# Patient Record
Sex: Female | Born: 1980 | Race: White | Hispanic: No | Marital: Single | Smoking: Current every day smoker
Health system: Southern US, Community
[De-identification: ages and names within clinical notes are randomized; demographics above are authoritative.]

## PROBLEM LIST (undated history)

## (undated) ENCOUNTER — Inpatient Hospital Stay (HOSPITAL_COMMUNITY): Payer: Self-pay

## (undated) DIAGNOSIS — R569 Unspecified convulsions: Secondary | ICD-10-CM

## (undated) DIAGNOSIS — R51 Headache: Secondary | ICD-10-CM

## (undated) DIAGNOSIS — K645 Perianal venous thrombosis: Secondary | ICD-10-CM

## (undated) DIAGNOSIS — M47819 Spondylosis without myelopathy or radiculopathy, site unspecified: Secondary | ICD-10-CM

## (undated) DIAGNOSIS — F329 Major depressive disorder, single episode, unspecified: Secondary | ICD-10-CM

## (undated) DIAGNOSIS — J209 Acute bronchitis, unspecified: Secondary | ICD-10-CM

## (undated) DIAGNOSIS — IMO0002 Reserved for concepts with insufficient information to code with codable children: Secondary | ICD-10-CM

## (undated) DIAGNOSIS — R112 Nausea with vomiting, unspecified: Secondary | ICD-10-CM

## (undated) DIAGNOSIS — F988 Other specified behavioral and emotional disorders with onset usually occurring in childhood and adolescence: Secondary | ICD-10-CM

## (undated) DIAGNOSIS — F99 Mental disorder, not otherwise specified: Secondary | ICD-10-CM

## (undated) DIAGNOSIS — J029 Acute pharyngitis, unspecified: Secondary | ICD-10-CM

## (undated) DIAGNOSIS — F419 Anxiety disorder, unspecified: Secondary | ICD-10-CM

## (undated) DIAGNOSIS — G4700 Insomnia, unspecified: Secondary | ICD-10-CM

## (undated) DIAGNOSIS — F53 Postpartum depression: Secondary | ICD-10-CM

## (undated) DIAGNOSIS — O99345 Other mental disorders complicating the puerperium: Secondary | ICD-10-CM

## (undated) HISTORY — PX: WISDOM TOOTH EXTRACTION: SHX21

## (undated) HISTORY — DX: Acute bronchitis, unspecified: J20.9

## (undated) HISTORY — DX: Acute pharyngitis, unspecified: J02.9

## (undated) HISTORY — DX: Other specified behavioral and emotional disorders with onset usually occurring in childhood and adolescence: F98.8

## (undated) HISTORY — DX: Nausea with vomiting, unspecified: R11.2

## (undated) HISTORY — DX: Postpartum depression: F53.0

## (undated) HISTORY — DX: Insomnia, unspecified: G47.00

## (undated) HISTORY — DX: Anxiety disorder, unspecified: F41.9

## (undated) HISTORY — DX: Spondylosis without myelopathy or radiculopathy, site unspecified: M47.819

## (undated) HISTORY — DX: Other mental disorders complicating the puerperium: O99.345

## (undated) HISTORY — DX: Major depressive disorder, single episode, unspecified: F32.9

## (undated) HISTORY — DX: Perianal venous thrombosis: K64.5

---
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
## 2002-12-25 ENCOUNTER — Emergency Department (HOSPITAL_COMMUNITY): Admission: EM | Admit: 2002-12-25 | Discharge: 2002-12-25 | Payer: Self-pay | Admitting: *Deleted

## 2002-12-25 ENCOUNTER — Encounter: Payer: Self-pay | Admitting: Emergency Medicine

## 2003-03-24 ENCOUNTER — Inpatient Hospital Stay (HOSPITAL_COMMUNITY): Admission: AD | Admit: 2003-03-24 | Discharge: 2003-03-24 | Payer: Self-pay | Admitting: Obstetrics and Gynecology

## 2004-02-27 ENCOUNTER — Emergency Department (HOSPITAL_COMMUNITY): Admission: EM | Admit: 2004-02-27 | Discharge: 2004-02-27 | Payer: Self-pay | Admitting: Emergency Medicine

## 2005-08-13 ENCOUNTER — Emergency Department (HOSPITAL_COMMUNITY): Admission: EM | Admit: 2005-08-13 | Discharge: 2005-08-13 | Payer: Self-pay | Admitting: Emergency Medicine

## 2005-09-18 ENCOUNTER — Emergency Department (HOSPITAL_COMMUNITY): Admission: EM | Admit: 2005-09-18 | Discharge: 2005-09-18 | Payer: Self-pay | Admitting: Emergency Medicine

## 2007-01-29 ENCOUNTER — Emergency Department (HOSPITAL_COMMUNITY): Admission: EM | Admit: 2007-01-29 | Discharge: 2007-01-29 | Payer: Self-pay | Admitting: Emergency Medicine

## 2007-04-06 ENCOUNTER — Emergency Department (HOSPITAL_COMMUNITY): Admission: EM | Admit: 2007-04-06 | Discharge: 2007-04-06 | Payer: Self-pay | Admitting: Emergency Medicine

## 2007-05-26 ENCOUNTER — Emergency Department (HOSPITAL_COMMUNITY): Admission: EM | Admit: 2007-05-26 | Discharge: 2007-05-26 | Payer: Self-pay | Admitting: Emergency Medicine

## 2007-10-25 DIAGNOSIS — IMO0002 Reserved for concepts with insufficient information to code with codable children: Secondary | ICD-10-CM

## 2007-10-25 HISTORY — DX: Reserved for concepts with insufficient information to code with codable children: IMO0002

## 2007-10-27 ENCOUNTER — Emergency Department (HOSPITAL_COMMUNITY): Admission: EM | Admit: 2007-10-27 | Discharge: 2007-10-27 | Payer: Self-pay | Admitting: Emergency Medicine

## 2008-03-16 ENCOUNTER — Emergency Department (HOSPITAL_COMMUNITY): Admission: EM | Admit: 2008-03-16 | Discharge: 2008-03-17 | Payer: Self-pay | Admitting: Emergency Medicine

## 2008-04-18 ENCOUNTER — Emergency Department (HOSPITAL_BASED_OUTPATIENT_CLINIC_OR_DEPARTMENT_OTHER): Admission: EM | Admit: 2008-04-18 | Discharge: 2008-04-18 | Payer: Self-pay | Admitting: Emergency Medicine

## 2008-05-09 ENCOUNTER — Emergency Department (HOSPITAL_BASED_OUTPATIENT_CLINIC_OR_DEPARTMENT_OTHER): Admission: EM | Admit: 2008-05-09 | Discharge: 2008-05-09 | Payer: Self-pay | Admitting: Emergency Medicine

## 2008-05-17 ENCOUNTER — Emergency Department (HOSPITAL_COMMUNITY): Admission: EM | Admit: 2008-05-17 | Discharge: 2008-05-17 | Payer: Self-pay | Admitting: Emergency Medicine

## 2008-06-07 ENCOUNTER — Inpatient Hospital Stay (HOSPITAL_COMMUNITY): Admission: EM | Admit: 2008-06-07 | Discharge: 2008-06-12 | Payer: Self-pay | Admitting: Emergency Medicine

## 2008-07-13 ENCOUNTER — Emergency Department (HOSPITAL_COMMUNITY): Admission: EM | Admit: 2008-07-13 | Discharge: 2008-07-13 | Payer: Self-pay | Admitting: Emergency Medicine

## 2008-07-26 ENCOUNTER — Ambulatory Visit (HOSPITAL_COMMUNITY): Admission: RE | Admit: 2008-07-26 | Discharge: 2008-07-26 | Payer: Self-pay | Admitting: Neurosurgery

## 2008-08-07 ENCOUNTER — Ambulatory Visit (HOSPITAL_COMMUNITY): Admission: RE | Admit: 2008-08-07 | Discharge: 2008-08-07 | Payer: Self-pay | Admitting: Neurosurgery

## 2008-08-11 ENCOUNTER — Emergency Department (HOSPITAL_COMMUNITY): Admission: EM | Admit: 2008-08-11 | Discharge: 2008-08-11 | Payer: Self-pay | Admitting: Emergency Medicine

## 2008-09-22 ENCOUNTER — Ambulatory Visit (HOSPITAL_COMMUNITY): Admission: RE | Admit: 2008-09-22 | Discharge: 2008-09-22 | Payer: Self-pay | Admitting: Neurosurgery

## 2008-10-16 ENCOUNTER — Emergency Department (HOSPITAL_COMMUNITY): Admission: EM | Admit: 2008-10-16 | Discharge: 2008-10-16 | Payer: Self-pay | Admitting: Emergency Medicine

## 2008-10-20 ENCOUNTER — Emergency Department (HOSPITAL_COMMUNITY): Admission: EM | Admit: 2008-10-20 | Discharge: 2008-10-20 | Payer: Self-pay | Admitting: Emergency Medicine

## 2009-01-01 ENCOUNTER — Ambulatory Visit (HOSPITAL_COMMUNITY): Admission: RE | Admit: 2009-01-01 | Discharge: 2009-01-01 | Payer: Self-pay | Admitting: Obstetrics

## 2009-01-27 ENCOUNTER — Inpatient Hospital Stay (HOSPITAL_COMMUNITY): Admission: AD | Admit: 2009-01-27 | Discharge: 2009-01-27 | Payer: Self-pay | Admitting: Obstetrics

## 2009-02-28 ENCOUNTER — Inpatient Hospital Stay (HOSPITAL_COMMUNITY): Admission: AD | Admit: 2009-02-28 | Discharge: 2009-03-01 | Payer: Self-pay | Admitting: Obstetrics & Gynecology

## 2009-02-28 ENCOUNTER — Ambulatory Visit: Payer: Self-pay | Admitting: Physician Assistant

## 2009-03-31 ENCOUNTER — Ambulatory Visit (HOSPITAL_COMMUNITY): Admission: RE | Admit: 2009-03-31 | Discharge: 2009-03-31 | Payer: Self-pay | Admitting: Obstetrics

## 2009-06-01 ENCOUNTER — Inpatient Hospital Stay (HOSPITAL_COMMUNITY): Admission: AD | Admit: 2009-06-01 | Discharge: 2009-06-01 | Payer: Self-pay | Admitting: Obstetrics

## 2009-06-07 ENCOUNTER — Inpatient Hospital Stay (HOSPITAL_COMMUNITY): Admission: AD | Admit: 2009-06-07 | Discharge: 2009-06-07 | Payer: Self-pay | Admitting: Obstetrics & Gynecology

## 2009-06-15 ENCOUNTER — Inpatient Hospital Stay (HOSPITAL_COMMUNITY): Admission: RE | Admit: 2009-06-15 | Discharge: 2009-06-18 | Payer: Self-pay | Admitting: Obstetrics & Gynecology

## 2009-07-15 ENCOUNTER — Ambulatory Visit: Payer: Self-pay | Admitting: Diagnostic Radiology

## 2009-07-15 ENCOUNTER — Emergency Department (HOSPITAL_BASED_OUTPATIENT_CLINIC_OR_DEPARTMENT_OTHER): Admission: EM | Admit: 2009-07-15 | Discharge: 2009-07-15 | Payer: Self-pay | Admitting: Emergency Medicine

## 2009-09-09 ENCOUNTER — Ambulatory Visit: Payer: Self-pay | Admitting: Diagnostic Radiology

## 2009-09-09 ENCOUNTER — Emergency Department (HOSPITAL_BASED_OUTPATIENT_CLINIC_OR_DEPARTMENT_OTHER): Admission: EM | Admit: 2009-09-09 | Discharge: 2009-09-09 | Payer: Self-pay | Admitting: Emergency Medicine

## 2009-09-23 LAB — CONVERTED CEMR LAB

## 2009-10-07 ENCOUNTER — Ambulatory Visit (HOSPITAL_COMMUNITY): Admission: RE | Admit: 2009-10-07 | Discharge: 2009-10-07 | Payer: Self-pay | Admitting: Neurosurgery

## 2009-11-06 ENCOUNTER — Inpatient Hospital Stay (HOSPITAL_COMMUNITY): Admission: RE | Admit: 2009-11-06 | Discharge: 2009-11-08 | Payer: Self-pay | Admitting: Neurosurgery
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
## 2010-04-15 ENCOUNTER — Emergency Department (HOSPITAL_BASED_OUTPATIENT_CLINIC_OR_DEPARTMENT_OTHER): Admission: EM | Admit: 2010-04-15 | Discharge: 2010-04-15 | Payer: Self-pay | Admitting: Emergency Medicine

## 2010-04-15 ENCOUNTER — Ambulatory Visit: Payer: Self-pay | Admitting: Diagnostic Radiology

## 2010-05-05 LAB — CONVERTED CEMR LAB: Pap Smear: NORMAL

## 2010-07-05 ENCOUNTER — Emergency Department (HOSPITAL_COMMUNITY): Admission: EM | Admit: 2010-07-05 | Discharge: 2010-07-05 | Payer: Self-pay | Admitting: Emergency Medicine

## 2010-07-19 ENCOUNTER — Emergency Department (HOSPITAL_COMMUNITY): Admission: EM | Admit: 2010-07-19 | Discharge: 2010-07-19 | Payer: Self-pay | Admitting: Emergency Medicine

## 2010-08-16 ENCOUNTER — Ambulatory Visit: Payer: Self-pay | Admitting: Family Medicine

## 2010-08-16 DIAGNOSIS — Z9189 Other specified personal risk factors, not elsewhere classified: Secondary | ICD-10-CM | POA: Insufficient documentation

## 2010-08-16 DIAGNOSIS — S82009A Unspecified fracture of unspecified patella, initial encounter for closed fracture: Secondary | ICD-10-CM

## 2010-08-16 DIAGNOSIS — F410 Panic disorder [episodic paroxysmal anxiety] without agoraphobia: Secondary | ICD-10-CM | POA: Insufficient documentation

## 2010-08-16 DIAGNOSIS — S129XXA Fracture of neck, unspecified, initial encounter: Secondary | ICD-10-CM | POA: Insufficient documentation

## 2010-08-16 DIAGNOSIS — M47819 Spondylosis without myelopathy or radiculopathy, site unspecified: Secondary | ICD-10-CM

## 2010-08-16 DIAGNOSIS — S2239XB Fracture of one rib, unspecified side, initial encounter for open fracture: Secondary | ICD-10-CM

## 2010-08-16 DIAGNOSIS — G43909 Migraine, unspecified, not intractable, without status migrainosus: Secondary | ICD-10-CM | POA: Insufficient documentation

## 2010-08-16 DIAGNOSIS — R569 Unspecified convulsions: Secondary | ICD-10-CM

## 2010-08-16 HISTORY — DX: Spondylosis without myelopathy or radiculopathy, site unspecified: M47.819

## 2010-09-13 ENCOUNTER — Other Ambulatory Visit: Admission: RE | Admit: 2010-09-13 | Discharge: 2010-09-13 | Payer: Self-pay | Admitting: Family Medicine

## 2010-09-13 ENCOUNTER — Ambulatory Visit: Payer: Self-pay | Admitting: Family Medicine

## 2010-09-13 DIAGNOSIS — R5383 Other fatigue: Secondary | ICD-10-CM

## 2010-09-13 DIAGNOSIS — J069 Acute upper respiratory infection, unspecified: Secondary | ICD-10-CM

## 2010-09-13 DIAGNOSIS — R5381 Other malaise: Secondary | ICD-10-CM

## 2010-09-13 DIAGNOSIS — D485 Neoplasm of uncertain behavior of skin: Secondary | ICD-10-CM

## 2010-09-14 ENCOUNTER — Telehealth: Payer: Self-pay | Admitting: Family Medicine

## 2010-09-14 LAB — CONVERTED CEMR LAB
AST: 13 units/L (ref 0–37)
BUN: 14 mg/dL (ref 6–23)
Basophils Relative: 0.4 % (ref 0.0–3.0)
Bilirubin, Direct: 0.1 mg/dL (ref 0.0–0.3)
Calcium: 9.1 mg/dL (ref 8.4–10.5)
Eosinophils Relative: 0 % (ref 0.0–5.0)
GFR calc non Af Amer: 89.16 mL/min (ref >60)
Glucose, Bld: 91 mg/dL (ref 70–99)
HCT: 36.6 % (ref 36.0–46.0)
Lymphs Abs: 1.6 10*3/uL (ref 0.7–4.0)
MCHC: 34.2 g/dL (ref 30.0–36.0)
MCV: 95.3 fL (ref 78.0–100.0)
Monocytes Absolute: 0.3 10*3/uL (ref 0.1–1.0)
Platelets: 213 10*3/uL (ref 150.0–400.0)
TSH: 0.26 microintl units/mL — ABNORMAL LOW (ref 0.35–5.50)
Total Bilirubin: 0.5 mg/dL (ref 0.3–1.2)
VLDL: 5.8 mg/dL (ref 0.0–40.0)
WBC: 6 10*3/uL (ref 4.5–10.5)

## 2010-10-11 ENCOUNTER — Telehealth: Payer: Self-pay | Admitting: Family Medicine

## 2010-10-13 ENCOUNTER — Ambulatory Visit: Payer: Self-pay | Admitting: Family Medicine

## 2010-10-13 DIAGNOSIS — J029 Acute pharyngitis, unspecified: Secondary | ICD-10-CM

## 2010-10-13 DIAGNOSIS — J209 Acute bronchitis, unspecified: Secondary | ICD-10-CM

## 2010-10-13 DIAGNOSIS — F17200 Nicotine dependence, unspecified, uncomplicated: Secondary | ICD-10-CM

## 2010-10-13 HISTORY — DX: Acute bronchitis, unspecified: J20.9

## 2010-10-13 HISTORY — DX: Acute pharyngitis, unspecified: J02.9

## 2010-11-13 ENCOUNTER — Encounter: Payer: Self-pay | Admitting: Obstetrics & Gynecology

## 2010-11-14 ENCOUNTER — Encounter: Payer: Self-pay | Admitting: Obstetrics

## 2010-11-22 ENCOUNTER — Encounter: Payer: Self-pay | Admitting: Family Medicine

## 2010-11-22 ENCOUNTER — Ambulatory Visit
Admission: RE | Admit: 2010-11-22 | Discharge: 2010-11-22 | Payer: Self-pay | Source: Home / Self Care | Attending: Family Medicine | Admitting: Family Medicine

## 2010-11-22 DIAGNOSIS — J209 Acute bronchitis, unspecified: Secondary | ICD-10-CM | POA: Insufficient documentation

## 2010-11-23 NOTE — Assessment & Plan Note (Signed)
Summary: 1 mo  rov/mm---PT [facility] // RS   Vital Signs:  Patient profile:   30 year old female Menstrual status:  regular Height:      66 inches Weight:      124 pounds O2 Sat:      99 % on Room air Temp:     98.4 degrees F oral Pulse rate:   82 / minute BP sitting:   122 / 78  (left arm) Cuff size:   regular  Vitals Entered By: Bill Salinas CMA (September 13, 2010 11:33 AM)  O2 Flow:  Room air CC: 1 month follow up/ ab Comments Pt states she takes Tussinex as needed  Menarche (age onset years): 15   Menses interval (days): every 21 to 35 days Menstrual flow (days): 4 days Last PAP Result normal   History of Present Illness: is a 30 year old Caucasian female exam. She reports overall feeling better than she did at her last visit. Finds the meloxicam daily and the Percocet 2-3 times a day to hold her pain for the most part. She is stiff and sore in the morning and in the care soap at all night his only marginally helped. No radiculopathy no incontinence. She is complaining of a mild cough over the last 2 weeks nonproductive no fevers, chills but some mild fatigue is noted. She would like a Pap smear today she is a G2 P2 status post 2 spontaneous vaginal deliveries last Pap was about 16 months ago was normal started at half. LMP was the 11th 15 through 1117 she tends to have 3-40 periods not too heavy for his. Was a 14 times monthly. She denies vaginal discharge she is an 18-month-old daughter has not been subjective since that time. No lesions, abdominal pain, fevers, chills, discharge or concerns are noted. She denies any breast tenderness lesions or concerns. She's used to test next year and at night for her cough and has been feeling metoprolol is not complaining of any headaches today and does believe that house helped her stress to an extent she is concerned about a small mole in her left on the left side which she's had since childhood which says occasionally doubles up and peels off and is  slowly changing over the last year to 2  Current Medications (verified): 1)  Percocet 5-325 Mg Tabs (Oxycodone-Acetaminophen) .Marland Kitchen.. 1 Tab By Mouth Three Times A Day As Needed Pain 2)  Metoprolol Tartrate 25 Mg Tabs (Metoprolol Tartrate) .... 1/2 Tab By Mouth Two Times A Day 3)  Meloxicam 15 Mg Tabs (Meloxicam) .Marland Kitchen.. 1 Tab By Mouth Daily As Needed Pain With Food, in Am, Replaces Ibuprofen 4)  Carisoprodol 350 Mg Tabs (Carisoprodol) .Marland Kitchen.. 1 Tab By Mouth Two Times A Day As Needed Pain, Mostly At Bedtime Muscle Relaxer 5)  Clonazepam 0.5 Mg Tabs (Clonazepam) .Marland Kitchen.. 1 Tab By Mouth Two Times A Day As Needed Anxiety/panic Disorder  Allergies (verified): No Known Drug Allergies  Past History:  Past medical history reviewed for relevance to current acute and chronic problems. Social history (including risk factors) reviewed for relevance to current acute and chronic problems.  Social History: Reviewed history from 08/16/2010 and no changes required. 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 No apparent alopecia or balding. Nose:  External nasal examination shows no deformity or inflammation. Nasal mucosa are pink and moist without lesions or exudates. Mouth:  Oral mucosa and oropharynx without lesions or exudates.  Teeth in good repair. Neck:  No deformities, masses, or tenderness noted. Breasts:  No mass, nodules, thickening, tenderness, bulging, retraction, inflamation, nipple discharge or skin changes noted.   Lungs:  Normal respiratory effort, chest expands symmetrically. Lungs are clear to auscultation, no crackles or wheezes. Heart:  Normal  rate and regular rhythm. S1 and S2 normal without gallop, murmur, click, rub or other extra sounds. Abdomen:  Bowel sounds positive,abdomen soft and non-tender without masses, organomegaly or hernias noted. Rectal:  external hemorrhoid(s).   Genitalia:  Normal introitus for age, no external lesions, no vaginal discharge, mucosa pink and moist, no vaginal or cervical lesions, no vaginal atrophy, no friaility or hemorrhage, normal uterus size and position, no adnexal masses or tenderness Msk:  No deformity or scoliosis noted of thoracic or lumbar spine.   Extremities:  No clubbing, cyanosis, edema, or deformity noted with normal full range of motion of all joints.   Skin:  3 mm light brown circular lesion just lateral to her mouth on left, slightly raised. Cervical Nodes:  No lymphadenopathy noted Psych:  Cognition and judgment appear intact. Alert and cooperative with normal attention span and concentration. No apparent delusions, illusions, hallucinations   Impression & Recommendations:  Problem # 1:  PREVENTIVE HEALTH CARE (ICD-V70.0)  Orders: TLB-Lipid Panel (80061-LIPID) Venipuncture (57846) Specimen Handling (96295) Pap taken today results pending  Problem # 2:  FATIGUE (ICD-780.79)  Orders: TLB-TSH (Thyroid Stimulating Hormone) (84443-TSH) Venipuncture (28413) Specimen Handling (24401) encouraged 7-8 hours of sleep and regular exercise  Problem # 3:  UPPER RESPIRATORY INFECTION (ICD-465.9)  Her updated medication list for this problem includes:    Meloxicam 15 Mg Tabs (Meloxicam) .Marland Kitchen... 1 tab by mouth daily as needed pain with food, in am, replaces ibuprofen  Orders: TLB-CBC Platelet - w/Differential (85025-CBCD) Venipuncture (02725) Specimen Handling (36644) Mild symptoms but is encouraged to take Mucinex two times a day and add Doxycycline if symptoms worsen, fevers develop or symptoms do not resolve  Problem # 4:  SEIZURE DISORDER (ICD-780.39)  Her updated medication  list for this problem includes:    Clonazepam 0.5 Mg Tabs (Clonazepam) .Marland Kitchen... 1 tab by mouth two times a day as needed anxiety/panic disorder  Orders: TLB-Hepatic/Liver Function Pnl (80076-HEPATIC) TLB-BMP (Basic Metabolic Panel-BMET) (80048-METABOL) Venipuncture (03474) Specimen Handling (25956) No activity  Problem # 5:  ARTHRITIS, CERVICAL SPINE (ICD-721.90) Pain improved with current meds. 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 1 tab by mouth three times a day as needed pain    Metoprolol Tartrate 25 Mg Tabs (Metoprolol tartrate) .Marland Kitchen... 1/2 tab by mouth two times a day    Meloxicam 15 Mg Tabs (Meloxicam) .Marland Kitchen... 1 tab by mouth daily as needed pain with food, in am, replaces ibuprofen Improved  Complete Medication List: 1)  Percocet 5-325 Mg Tabs (Oxycodone-acetaminophen) .Marland Kitchen.. 1 tab by mouth three times a day as needed pain 2)  Metoprolol Tartrate 25 Mg Tabs (  Metoprolol tartrate) .... 1/2 tab by mouth two times a day 3)  Meloxicam 15 Mg Tabs (Meloxicam) .Marland Kitchen.. 1 tab by mouth daily as needed pain with food, in am, replaces ibuprofen 4)  Carisoprodol 350 Mg Tabs (Carisoprodol) .Marland Kitchen.. 1 tab by mouth two times a day as needed pain, mostly at bedtime muscle relaxer 5)  Clonazepam 0.5 Mg Tabs (Clonazepam) .Marland Kitchen.. 1 tab by mouth two times a day as needed anxiety/panic disorder 6)  Doxycycline Hyclate 100 Mg Caps (Doxycycline hyclate) .Marland Kitchen.. 1 cap by mouth two times a day x 10 days as needed bronchitis  Patient Instructions: 1)  Please schedule a follow-up appointment in 2 months with Dr Abner Greenspan or call to be seen sooner if any  concerns Prescriptions: DOXYCYCLINE HYCLATE 100 MG CAPS (DOXYCYCLINE HYCLATE) 1 cap by mouth two times a day x 10 days as needed bronchitis  #20 x 0   Entered and Authorized by:   Danise Edge MD   Signed by:   Danise Edge MD on 09/13/2010   Method used:   Print then Give to Patient   RxID:   [id number] CLONAZEPAM 0.5 MG TABS (CLONAZEPAM) 1 tab by mouth two times a day as needed anxiety/panic disorder  #60 x 1   Entered and Authorized by:   Danise Edge MD   Signed by:   Danise Edge MD on 09/13/2010   Method used:   Print then Give to Patient   RxID:   [id number] CARISOPRODOL 350 MG TABS (CARISOPRODOL) 1 tab by mouth two times a day as needed pain, mostly at bedtime muscle relaxer  #60 x 1   Entered and Authorized by:   Danise Edge MD   Signed by:   Danise Edge MD on 09/13/2010   Method used:   Electronically to        [facility]  [phone]* (retail)       [address]       [place], [place]  [address]       Ph: [phone] or [phone]       Fax: [phone]   RxID:   [id number] METOPROLOL TARTRATE 25 MG TABS (METOPROLOL TARTRATE) 1/2 tab by mouth two times a day  #30 x 2   Entered and Authorized by:   Danise Edge MD   Signed by:   Danise Edge MD on 09/13/2010   Method used:   Electronically to        [facility]  [phone]* (retail)       [address]       [place], [place]  [address]       Ph: [phone] or [phone]       Fax: [phone]   RxID:   [id number] MELOXICAM 15 MG TABS (MELOXICAM) 1 tab by mouth daily as needed pain with food, in am, replaces Ibuprofen  #30 x 2   Entered and Authorized by:   Danise Edge MD   Signed by:   Danise Edge MD on 09/13/2010   Method used:   Electronically to        [facility]  [phone]* (retail)       [address]       [place], [place]  [address]       Ph: [phone] or [phone]        Fax: [phone]  RxID:   [id number] PERCOCET 5-325 MG TABS (OXYCODONE-ACETAMINOPHEN) 1 tab by mouth three times a day as needed pain  #90 x 0   Entered and Authorized by:   Danise Edge MD   Signed by:   Danise Edge MD on 09/13/2010   Method used:   Print then Give to Patient   RxID:   [id number]    Orders Added: 1)  TLB-Lipid Panel [80061-LIPID] 2)  TLB-TSH (Thyroid Stimulating Hormone) [84443-TSH] 3)  TLB-Hepatic/Liver Function Pnl [80076-HEPATIC] 4)  TLB-CBC Platelet - w/Differential [85025-CBCD] 5)  TLB-BMP (Basic Metabolic Panel-BMET) [80048-METABOL] 6)  Venipuncture [95284] 7)  Specimen Handling [99000] 8)  Est. Patient Level IV [13244]

## 2010-11-23 NOTE — Progress Notes (Signed)
Summary: ?vaginal or cervical  Phone Note From Other Clinic Call back at [phone]   Caller: [facility] Summary of Call: Pt had pap no source was given ? vaginal or cervical Initial call taken by: Heron Sabins,  September 14, 2010 9:44 AM  Follow-up for Phone Call        cervical Follow-up by: Danise Edge MD,  September 14, 2010 9:46 AM  Additional Follow-up for Phone Call Additional follow up Details #1::        I informed Malachi Bonds Additional Follow-up by: Josph Macho RMA,  September 14, 2010 10:59 AM
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
## 2010-11-23 NOTE — Miscellaneous (Signed)
Summary: Controlled Substances Contract  Controlled Substances Contract   Imported By: Maryln Gottron 08/18/2010 13:19:38  _____________________________________________________________________  External Attachment:    Type:   Image     Comment:   External Document

## 2010-11-25 NOTE — Progress Notes (Signed)
Summary: RX refills  Phone Note Refill Request   Refills Requested: Medication #1:  PERCOCET 5-325 MG TABS 1 tab by mouth three times a day as needed pain   Dosage confirmed as above?Dosage Confirmed  Medication #2:  METOPROLOL TARTRATE 25 MG TABS 1/2 tab by mouth two times a day   Dosage confirmed as above?Dosage Confirmed  Medication #3:  CLONAZEPAM 0.5 MG TABS 1 tab by mouth two times a day as needed anxiety/panic disorder   Dosage confirmed as above?Dosage Confirmed Pt called stating she got a chipped tooth over the weekend and the dentist (Dr. Revonda Humphrey) gave her Hydrocodone 10mg  (12 tablets) (pt wanted md to be aware because she is under a controlled substance contract). Pt states she does have to take 3 pain pills a day and it doesn't help her neck pain. Please advise refill.  Initial call taken by: Josph Macho RMA,  October 11, 2010 10:28 AM  Follow-up for Phone Call        she would have to come in to discuss changes in controlled substances Follow-up by: Danise Edge MD,  October 11, 2010 11:31 AM  Additional Follow-up for Phone Call Additional follow up Details #1::        Pt is coming in on Wed at 330. We can refill then Additional Follow-up by: Josph Macho RMA,  October 11, 2010 11:39 AM    Prescriptions: METOPROLOL TARTRATE 25 MG TABS (METOPROLOL TARTRATE) 1/2 tab by mouth two times a day  #30 x 2   Entered by:   Josph Macho RMA   Authorized by:   Danise Edge MD   Signed by:   Josph Macho RMA on 10/11/2010   Method used:   [facility] to        [facility]  [place] [address] (retail)       [address] [place] [address]       [place], [place]  [address]       Ph: [phone] or [phone]       Fax: [phone]   RxID:   [id number]
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
## 2010-12-01 NOTE — Assessment & Plan Note (Signed)
Summary: sick and needs refill on meds./vfw   Vital Signs:  Patient profile:   30 year old female Menstrual status:  regular Height:      66 inches (167.64 cm) Weight:      121.75 pounds (55.34 kg) O2 Sat:      99 % on Room air Temp:     98.0 degrees F (36.67 degrees C) oral Pulse rate:   93 / minute BP sitting:   103 / 69  (right arm) Cuff size:   regular  Vitals Entered By: Josph Macho RMA (November 22, 2010 9:49 AM)  O2 Flow:  Room air CC: Haven't got over cold from Dec appt- congested, dry cough/ med refills/ CF Is Patient Diabetic? No   History of Present Illness: Patient is a 30 yo Caucasian female in today for reevaluation of some persistent URI symptoms. Patient recently had a course of Axithromycin which helped temporarily but then when she stopped it her symptoms worsened again. several weeks she is struggling with low-grade fevers and chills, malaise, clear rhinorrhea, postnasal drip and irritation in her throat. She's now developed a cough moving onto her chest and a sense of tightness in her chest. Her cough is intermittently productive of green sputum. She is also struggling with some intermittent anorexia although that is improved at today's visit. She had severe discomfort but that is improved, she is denying headaches or wheezing. She is denying any diarrhea, nausea, vomiting or GI complaints. No chest pain or palpitations. Her back pain and headaches are persistent but tolerable and she has done a good job of trying to minimize her pain medication use.  Preventive Screening-Counseling & Management  Alcohol-Tobacco     Smoking Cessation Counseling: YES  Current Medications (verified): 1)  Metoprolol Tartrate 25 Mg Tabs (Metoprolol Tartrate) .... 1/2 Tab By Mouth Two Times A Day 2)  Meloxicam 15 Mg Tabs (Meloxicam) .Marland Kitchen.. 1 Tab By Mouth Daily As Needed Pain With Food, in Am, Replaces Ibuprofen 3)  Clonazepam 0.5 Mg Tabs (Clonazepam) .Marland Kitchen.. 1 Tab By Mouth Two Times A  Day As Needed Anxiety/panic Disorder 4)  Percocet 7.5-325 Mg Tabs (Oxycodone-Acetaminophen) .Marland Kitchen.. 1 Tab By Mouth Three Times A Day As Needed Pain and A 2 Nd Tab As Needed Severe Pain 5)  Mucinex 600 Mg Xr12h-Tab (Guaifenesin) .Marland Kitchen.. 1 Tab By Mouth Two Times A Day X 10 Days 6)  Baclofen 10 Mg Tabs (Baclofen) .Marland Kitchen.. 1-2 Tab By Mouth Three Times A Day As Needed Pain Muscle Relaxers  Allergies (verified): No Known Drug Allergies  Past History:  Past medical history reviewed for relevance to current acute and chronic problems. Social history (including risk factors) reviewed for relevance to current acute and chronic problems.  Social History: Reviewed history from 08/16/2010 and no changes required. 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 No apparent alopecia or balding. Ears:  External ear exam shows no significant lesions or deformities.  Otoscopic examination reveals clear canals, tympanic membranes are intact bilaterally without bulging, retraction, inflammation or discharge. Hearing is grossly normal bilaterally. TMs mildly dull Nose:  External nasal examination shows no deformity or inflammation. Nasal mucosa are pink and moist without lesions or exudates. Mouth:  Oral  mucosa and oropharynx without lesions or exudates Neck:  No deformities, masses, or tenderness noted. Lungs:  Normal respiratory effort, chest expands symmetrically. Lungs are clear to auscultation, no crackles or wheezes except slight rhonchi left base Heart:  Normal rate and regular rhythm. S1 and S2 normal without gallop, murmur, click, rub or other extra  sounds. Abdomen:  Bowel sounds positive,abdomen soft and non-tender without masses, organomegaly or hernias noted. Extremities:  No clubbing, cyanosis, edema, or deformity noted with normal full range of motion of all joints.   Cervical Nodes:  No lymphadenopathy noted Psych:  Cognition and judgment appear intact. Alert and cooperative with normal attention span and concentration. No apparent delusions, illusions, hallucinations   Impression & Recommendations:  Problem # 1:  ACUTE BRONCHITIS (ICD-466.0)  The following medications were removed from the medication list:    Cipro 500 Mg Tabs (Ciprofloxacin hcl) .Marland Kitchen... 1 tab by mouth two times a day x 10 day Her updated medication list for this problem includes:    Mucinex 600 Mg Xr12h-tab (Guaifenesin) .Marland Kitchen... 1 tab by mouth two times a day x 10 days    Amoxicillin 500 Mg Tabs (Amoxicillin) .Marland Kitchen... 1 tab by mouth three times a day x 10 days  Orders: Tobacco use cessation intermediate 3-10 minutes (99406) Push clear fluids and call if any concerns  Problem # 2:  TOBACCO ABUSE (ICD-305.1)  Orders: Tobacco use cessation intermediate 3-10 minutes (16109) Encouraged her to at least try waiting 15 minutes prior to smoking any cigarette she craves and she is to try complete cessation eventually, she is educated regarding her ability to fight off infection better if she quits smoking  Problem # 3:  ARTHRITIS, CERVICAL SPINE (ICD-721.90) Is allowed a RF on her pain meds today, she has done a reasonably good job minimizing her pain medication use and may continue on current course for now. Once again encouraged to stay active and use her antiinflammatories regularly  Complete Medication List: 1)  Metoprolol Tartrate 25 Mg Tabs (Metoprolol tartrate) .... 1/2 tab by mouth two times a day 2)  Meloxicam 15 Mg Tabs (Meloxicam) .Marland Kitchen.. 1 tab by mouth daily as needed pain with food, in am, replaces ibuprofen 3)  Clonazepam 0.5 Mg Tabs (Clonazepam) .Marland Kitchen.. 1 tab by  mouth two times a day as needed anxiety/panic disorder 4)  Percocet 7.5-325 Mg Tabs (Oxycodone-acetaminophen) .Marland Kitchen.. 1 tab by mouth three times a day as needed pain and a 2 nd tab as needed severe pain 5)  Mucinex 600 Mg Xr12h-tab (Guaifenesin) .Marland Kitchen.. 1 tab by mouth two times a day x 10 days 6)  Baclofen 10 Mg Tabs (Baclofen) .Marland Kitchen.. 1-2 tab by mouth three times a day as needed pain muscle relaxers 7)  Amoxicillin 500 Mg Tabs (Amoxicillin) .Marland Kitchen.. 1 tab by mouth three times a day x 10 days  Patient Instructions: 1)  Please schedule a follow-up appointment as needed if symptoms worsen  2)  Take your antibiotic as prescribed until ALL of it is gone, but stop if you develop a rash or swelling and contact our office as soon as possible.  3)  Acute Bronchitis symptoms for less then 10 days are not  helped by antibiotics. Take over the counter cough medications. Call if no improvement in 5-7 days, sooner if increasing cough, fever, or new symptoms ( shortness of breath, chest pain) .  4)  Continue thte Guaifenesin and increase clear fluids 5)  Tobacco is very bad for your health and your loved ones !  You should stop smoking !  6)  Stop smoking tips: Choose a quit date. Cut down before the quit date. Decide what you will do as a substitute when you feel the urge to smoke(gum, toothpick, exercise).  Prescriptions: PERCOCET 7.5-325 MG TABS (OXYCODONE-ACETAMINOPHEN) 1 tab by mouth three times a day as needed pain and a 2 nd tab as needed severe pain  #120 x 0   Entered and Authorized by:   Danise Edge MD   Signed by:   Danise Edge MD on 11/22/2010   Method used:   Print then Give to Patient   RxID:   [id number] AMOXICILLIN 500 MG TABS (AMOXICILLIN) 1 tab by mouth three times a day x 10 days  #30 x 0   Entered and Authorized by:   Danise Edge MD   Signed by:   Danise Edge MD on 11/22/2010   Method used:   Electronically to        [facility]  [phone]* (retail)       [address]       [place], [place]  [address]       Ph: [phone] or [phone]       Fax: [phone]   RxID:   [id number]    Orders Added: 1)  Tobacco use cessation intermediate 3-10 minutes [99406] 2)  Est. Patient Level III [87564]

## 2010-12-02 ENCOUNTER — Encounter: Payer: Self-pay | Admitting: Family Medicine

## 2010-12-29 ENCOUNTER — Telehealth (INDEPENDENT_AMBULATORY_CARE_PROVIDER_SITE_OTHER): Payer: Self-pay | Admitting: *Deleted

## 2010-12-29 ENCOUNTER — Encounter: Payer: Self-pay | Admitting: Family Medicine

## 2010-12-29 ENCOUNTER — Ambulatory Visit (INDEPENDENT_AMBULATORY_CARE_PROVIDER_SITE_OTHER): Payer: Medicaid Other | Admitting: Family Medicine

## 2010-12-29 ENCOUNTER — Ambulatory Visit: Payer: Self-pay | Admitting: Family Medicine

## 2010-12-29 DIAGNOSIS — M479 Spondylosis, unspecified: Secondary | ICD-10-CM

## 2010-12-29 DIAGNOSIS — J209 Acute bronchitis, unspecified: Secondary | ICD-10-CM

## 2010-12-29 DIAGNOSIS — F17200 Nicotine dependence, unspecified, uncomplicated: Secondary | ICD-10-CM

## 2011-01-02 ENCOUNTER — Encounter: Payer: Self-pay | Admitting: Family Medicine

## 2011-01-04 NOTE — Progress Notes (Signed)
Summary: Medication refill  Phone Note Refill Request Call back at [phone]   Refills Requested: Medication #1:  PERCOCET 7.5-325 MG TABS 1 tab by mouth three times a day as needed pain and a 2 nd tab as needed severe pain patient is taking medication 4 times a day taking the fourth pill to help with sleeping at night because of pain in her neck. Wants to talk with the doctor.  Initial call taken by: Georga Bora,  December 29, 2010 12:28 PM  Follow-up for Phone Call        will not change these meds over the phone, they are controlled and she needs an exam by me or a referral to pain management before any changes can be made Follow-up by: Danise Edge MD,  December 29, 2010 12:40 PM  Additional Follow-up for Phone Call Additional follow up Details #1::        pt inormed and is coming in this afternoon for appt Additional Follow-up by: Josph Macho RMA,  December 29, 2010 12:44 PM

## 2011-01-09 LAB — BASIC METABOLIC PANEL
BUN: 13 mg/dL (ref 6–23)
CO2: 25 mEq/L (ref 19–32)
Calcium: 9.4 mg/dL (ref 8.4–10.5)
Creatinine, Ser: 0.78 mg/dL (ref 0.4–1.2)
Glucose, Bld: 81 mg/dL (ref 70–99)

## 2011-01-09 LAB — CBC
HCT: 22.8 % — ABNORMAL LOW (ref 36.0–46.0)
Hemoglobin: 8.1 g/dL — ABNORMAL LOW (ref 12.0–15.0)
MCHC: 35.1 g/dL (ref 30.0–36.0)
MCV: 94.8 fL (ref 78.0–100.0)
Platelets: 228 10*3/uL (ref 150–400)
RDW: 11.4 % — ABNORMAL LOW (ref 11.5–15.5)
RDW: 11.9 % (ref 11.5–15.5)
WBC: 6.5 10*3/uL (ref 4.0–10.5)

## 2011-01-09 LAB — POCT I-STAT 4, (NA,K, GLUC, HGB,HCT)
Potassium: 3.7 mEq/L (ref 3.5–5.1)
Sodium: 141 mEq/L (ref 135–145)

## 2011-01-09 LAB — TYPE AND SCREEN: ABO/RH(D): O POS

## 2011-01-09 LAB — ABO/RH: ABO/RH(D): O POS

## 2011-01-11 NOTE — Assessment & Plan Note (Signed)
Summary: prescription refill   Vital Signs:  Patient profile:   30 year old female Menstrual status:  regular Height:      66 inches (167.64 cm) Weight:      118.06 pounds (53.66 kg) O2 Sat:      100 % on Room air Temp:     98.3 degrees F (36.83 degrees C) oral Pulse rate:   90 / minute BP sitting:   119 / 80  (right arm) Cuff size:   regular  Vitals Entered By: Josph Macho RMA (December 29, 2010 1:56 PM)  O2 Flow:  Room air CC: Percocet refill/ CF Is Patient Diabetic? No   History of Present Illness: Patient is a 30 yo Caucasian female in today for reevaluation of bronchitis which she reports is greatly improved since her last visit. No ocugh/congestion/fevers/chills/CP/palp/SOB/GI or GU c/o. Her family recently had gastroenteritis but she was fortunate not to develop symptoms. She continues to smoke a small amount each day. She is concerned about some mild pain in her neck. Notes stiff muscles/pain at occiput but no radicular or neuropathic symptoms  Current Medications (verified): 1)  Metoprolol Tartrate 25 Mg Tabs (Metoprolol Tartrate) .... 1/2 Tab By Mouth Two Times A Day 2)  Meloxicam 15 Mg Tabs (Meloxicam) .Marland Kitchen.. 1 Tab By Mouth Daily As Needed Pain With Food, in Am, Replaces Ibuprofen 3)  Clonazepam 0.5 Mg Tabs (Clonazepam) .Marland Kitchen.. 1 Tab By Mouth Two Times A Day As Needed Anxiety/panic Disorder 4)  Percocet 7.5-325 Mg Tabs (Oxycodone-Acetaminophen) .Marland Kitchen.. 1 Tab By Mouth Three Times A Day As Needed Pain and A 2 Nd Tab As Needed Severe Pain 5)  Baclofen 10 Mg Tabs (Baclofen) .Marland Kitchen.. 1-2 Tab By Mouth Three Times A Day As Needed Pain Muscle Relaxers  Allergies (verified): No Known Drug Allergies  Past History:  Past medical history reviewed for relevance to current acute and chronic problems. Social history (including risk factors) reviewed for relevance to current acute and chronic problems.  Social History: Reviewed history from 08/16/2010 and no changes required. 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 No apparent alopecia or balding. Mouth:  Oral mucosa and oropharynx without lesions or exudates.  Teeth in good repair. Neck:  No deformities, masses, or tenderness noted. well healed, posterior midline scar Lungs:  Normal respiratory effort, chest expands symmetrically. Lungs are clear to auscultation, no crackles or wheezes. Heart:  Normal rate and regular rhythm. S1 and S2 normal without gallop, murmur, click, rub or other extra sounds. Abdomen:  Bowel sounds positive,abdomen soft and non-tender without masses, organomegaly or hernias noted. Pulses:  R and L carotid,radial,femoral,dorsalis pedis and posterior tibial pulses are full and equal bilaterally Extremities:  No clubbing, cyanosis, edema, or deformity noted with normal full range of motion of all joints.   Cervical Nodes:  No lymphadenopathy noted Psych:  Cognition and judgment appear intact. Alert and cooperative with normal attention span and concentration. No apparent delusions, illusions, hallucinations   Impression & Recommendations:  Problem # 1:  ACUTE BRONCHITIS (ICD-466.0)  The following medications were removed from the medication list:    Mucinex 600 Mg Xr12h-tab (Guaifenesin) .Marland Kitchen... 1 tab by mouth two times a day x 10  days    Amoxicillin 500 Mg Tabs (Amoxicillin) .Marland Kitchen... 1 tab by mouth three times a day x 10 days Improved since treatment cough is much better, no significant respiratory.  Problem # 2:  TOBACCO ABUSE (ICD-305.1) Patient encouraged to completely stop smoking, she is in agreement and may try some nicotine replacement products  to use as needed   Problem # 3:  ARTHRITIS, CERVICAL SPINE (ICD-721.90) Patient c/o some stiffness in her neck recently worsened. No trauma or falls, no radicular symptoms just tight muscles over her posterior neck and b/l SCM muscles, tension also noted over occiput, encouraged ice two times a day and may alternate with heat, May try topical Aspercreme and continue current meds  Complete Medication List: 1)  Metoprolol Tartrate 25 Mg Tabs (Metoprolol tartrate) .... 1/2 tab by mouth two times a day 2)  Meloxicam 15 Mg Tabs (Meloxicam) .Marland Kitchen.. 1 tab by mouth daily as needed pain with food, in am, replaces ibuprofen 3)  Clonazepam 0.5 Mg Tabs (Clonazepam) .Marland Kitchen.. 1 tab by mouth two times a day as needed anxiety/panic disorder 4)  Percocet 7.5-325 Mg Tabs (Oxycodone-acetaminophen) .Marland Kitchen.. 1 tab by mouth three times a day as needed pain and a 2 nd tab as needed severe pain 5)  Baclofen 10 Mg Tabs (Baclofen) .Marland Kitchen.. 1-2 tab by mouth three times a day as needed pain muscle relaxers  Patient Instructions: 1)  Please schedule a follow-up appointment in 3 months if none already scheduled and as needed 2)  try ice and/or heat to occiput and Aspercreme  as needed  Prescriptions: PERCOCET 7.5-325 MG TABS (OXYCODONE-ACETAMINOPHEN) 1 tab by mouth three times a day as needed pain and a 2 nd tab as needed severe pain  #120 x 0   Entered and Authorized by:   Danise Edge MD   Signed by:   Danise Edge MD on 12/29/2010   Method used:   Print then Give to Patient   RxID:   [id number]    Orders Added: 1)  Est. Patient Level III [14782]

## 2011-01-25 ENCOUNTER — Telehealth: Payer: Self-pay

## 2011-01-25 ENCOUNTER — Other Ambulatory Visit: Payer: Self-pay | Admitting: Family Medicine

## 2011-01-25 DIAGNOSIS — M549 Dorsalgia, unspecified: Secondary | ICD-10-CM

## 2011-01-25 MED ORDER — OXYCODONE-ACETAMINOPHEN 7.5-325 MG PO TABS: 1.0000 | ORAL_TABLET | Freq: Three times a day (TID) | ORAL | Status: DC | PRN

## 2011-01-25 NOTE — Telephone Encounter (Signed)
She last filled an rx on 3/7, she can have more on Thursday morning, 4/5, #120, no refills

## 2011-01-25 NOTE — Telephone Encounter (Signed)
[name] [name] and I have both tried calling home and mobile number. A voice tells [place] we have dialed incorrectly. Work number states pt doesn't work there. And moms number states the same as pts home and cell. RX in front cabinet with Erie Noe and [name] [name] aware pt can't have RX until Thursday 01-27-11

## 2011-01-29 LAB — CBC
HCT: 35.8 % — ABNORMAL LOW (ref 36.0–46.0)
Hemoglobin: 12.5 g/dL (ref 12.0–15.0)
MCHC: 34.8 g/dL (ref 30.0–36.0)
MCHC: 34.9 g/dL (ref 30.0–36.0)
MCV: 97.9 fL (ref 78.0–100.0)
Platelets: 200 10*3/uL (ref 150–400)
Platelets: 205 10*3/uL (ref 150–400)
RDW: 13.3 % (ref 11.5–15.5)
RDW: 13.5 % (ref 11.5–15.5)
WBC: 16.3 10*3/uL — ABNORMAL HIGH (ref 4.0–10.5)

## 2011-01-29 LAB — WET PREP, GENITAL
Trich, Wet Prep: NONE SEEN
Yeast Wet Prep HPF POC: NONE SEEN

## 2011-02-01 LAB — GC/CHLAMYDIA PROBE AMP, GENITAL: Chlamydia, DNA Probe: NEGATIVE

## 2011-02-01 LAB — URINALYSIS, ROUTINE W REFLEX MICROSCOPIC
Bilirubin Urine: NEGATIVE
Ketones, ur: NEGATIVE mg/dL
Nitrite: NEGATIVE
Urobilinogen, UA: 0.2 mg/dL (ref 0.0–1.0)
pH: 6 (ref 5.0–8.0)

## 2011-02-01 LAB — WET PREP, GENITAL: Clue Cells Wet Prep HPF POC: NONE SEEN

## 2011-02-01 LAB — FETAL FIBRONECTIN: Fetal Fibronectin: NEGATIVE

## 2011-02-02 LAB — URINALYSIS, ROUTINE W REFLEX MICROSCOPIC
Bilirubin Urine: NEGATIVE
Nitrite: NEGATIVE
Specific Gravity, Urine: 1.025 (ref 1.005–1.030)
Urobilinogen, UA: 0.2 mg/dL (ref 0.0–1.0)

## 2011-02-22 ENCOUNTER — Other Ambulatory Visit: Payer: Self-pay

## 2011-02-22 DIAGNOSIS — M549 Dorsalgia, unspecified: Secondary | ICD-10-CM

## 2011-02-22 MED ORDER — OXYCODONE-ACETAMINOPHEN 7.5-325 MG PO TABS: 1.0000 | ORAL_TABLET | Freq: Three times a day (TID) | ORAL | Status: DC | PRN

## 2011-02-22 NOTE — Telephone Encounter (Signed)
Pt informed that RX can be picked up tomorrow
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
## 2011-03-23 ENCOUNTER — Telehealth: Payer: Self-pay

## 2011-03-23 DIAGNOSIS — M549 Dorsalgia, unspecified: Secondary | ICD-10-CM

## 2011-03-23 NOTE — Telephone Encounter (Signed)
Patient states she is going to be out of town next week for her uncles funeral in [place] and she will be out of her medication. Pt would like a refill for Klonopin, Metoprolol, Oxycodone.Marland Kitchen

## 2011-03-24 NOTE — Telephone Encounter (Signed)
Please check when these are do and we can give them just 3 days early

## 2011-03-25 MED ORDER — METOPROLOL TARTRATE 25 MG PO TABS: 25.0000 mg | ORAL_TABLET | Freq: Two times a day (BID) | ORAL | Status: DC

## 2011-03-25 MED ORDER — OXYCODONE-ACETAMINOPHEN 7.5-325 MG PO TABS: 1.0000 | ORAL_TABLET | Freq: Three times a day (TID) | ORAL | Status: DC | PRN

## 2011-03-25 MED ORDER — CLONAZEPAM 0.5 MG PO TABS: 0.5000 mg | ORAL_TABLET | Freq: Two times a day (BID) | ORAL | Status: DC | PRN

## 2011-03-25 NOTE — Telephone Encounter (Signed)
Pt informed. RX's given to front desk

## 2011-03-26 ENCOUNTER — Inpatient Hospital Stay (HOSPITAL_COMMUNITY)
Admission: EM | Admit: 2011-03-26 | Discharge: 2011-03-26 | Disposition: A | Discharge status: Home / Self Care | Payer: Medicaid Other | Source: Ambulatory Visit | Attending: Obstetrics & Gynecology | Admitting: Obstetrics & Gynecology

## 2011-03-26 ENCOUNTER — Inpatient Hospital Stay (HOSPITAL_COMMUNITY): Payer: Medicaid Other

## 2011-03-26 DIAGNOSIS — O9989 Other specified diseases and conditions complicating pregnancy, childbirth and the puerperium: Secondary | ICD-10-CM

## 2011-03-26 DIAGNOSIS — O99891 Other specified diseases and conditions complicating pregnancy: Secondary | ICD-10-CM | POA: Insufficient documentation

## 2011-03-26 DIAGNOSIS — R109 Unspecified abdominal pain: Secondary | ICD-10-CM | POA: Insufficient documentation

## 2011-03-26 LAB — WET PREP, GENITAL

## 2011-03-26 LAB — URINALYSIS, ROUTINE W REFLEX MICROSCOPIC
Glucose, UA: NEGATIVE mg/dL
Hgb urine dipstick: NEGATIVE
Ketones, ur: NEGATIVE mg/dL
Protein, ur: NEGATIVE mg/dL

## 2011-03-26 LAB — CBC
HCT: 33.1 % — ABNORMAL LOW (ref 36.0–46.0)
MCH: 32.4 pg (ref 26.0–34.0)
MCHC: 34.4 g/dL (ref 30.0–36.0)
MCV: 94 fL (ref 78.0–100.0)
RDW: 12.4 % (ref 11.5–15.5)

## 2011-03-28 LAB — GC/CHLAMYDIA PROBE AMP, GENITAL: GC Probe Amp, Genital: NEGATIVE

## 2011-03-29 ENCOUNTER — Emergency Department (HOSPITAL_BASED_OUTPATIENT_CLINIC_OR_DEPARTMENT_OTHER)
Admission: EM | Admit: 2011-03-29 | Discharge: 2011-03-29 | Disposition: A | Discharge status: Home / Self Care | Payer: Medicaid Other | Attending: Emergency Medicine | Admitting: Emergency Medicine

## 2011-03-29 DIAGNOSIS — B86 Scabies: Secondary | ICD-10-CM | POA: Insufficient documentation

## 2011-03-29 NOTE — Progress Notes (Signed)
Pt doesn't have a pharmacy listed in our computer due to getting hard copies of RXS

## 2011-04-25 LAB — GC/CHLAMYDIA PROBE AMP, GENITAL: Chlamydia: NEGATIVE

## 2011-04-29 ENCOUNTER — Inpatient Hospital Stay (HOSPITAL_COMMUNITY)
Admission: AD | Admit: 2011-04-29 | Discharge: 2011-04-29 | Disposition: A | Discharge status: Home / Self Care | Payer: Medicaid Other | Source: Ambulatory Visit | Attending: Obstetrics | Admitting: Obstetrics

## 2011-04-29 DIAGNOSIS — R109 Unspecified abdominal pain: Secondary | ICD-10-CM | POA: Insufficient documentation

## 2011-04-29 DIAGNOSIS — O99891 Other specified diseases and conditions complicating pregnancy: Secondary | ICD-10-CM | POA: Insufficient documentation

## 2011-04-29 LAB — URINALYSIS, ROUTINE W REFLEX MICROSCOPIC
Nitrite: NEGATIVE
Protein, ur: NEGATIVE mg/dL
Specific Gravity, Urine: >1.03 — ABNORMAL HIGH (ref 1.005–1.030)
Urobilinogen, UA: 0.2 mg/dL (ref 0.0–1.0)

## 2011-04-29 LAB — URINE MICROSCOPIC-ADD ON

## 2011-05-24 LAB — HIV ANTIBODY (ROUTINE TESTING W REFLEX): HIV: NONREACTIVE

## 2011-05-24 LAB — ABO/RH

## 2011-05-24 LAB — RPR: RPR: NONREACTIVE

## 2011-05-26 HISTORY — PX: CERVICAL FUSION: SHX112

## 2011-06-15 ENCOUNTER — Other Ambulatory Visit: Payer: Self-pay | Admitting: Obstetrics

## 2011-06-15 DIAGNOSIS — F1120 Opioid dependence, uncomplicated: Secondary | ICD-10-CM

## 2011-06-15 DIAGNOSIS — G894 Chronic pain syndrome: Secondary | ICD-10-CM

## 2011-06-22 ENCOUNTER — Ambulatory Visit (HOSPITAL_COMMUNITY): Payer: Medicaid Other

## 2011-07-01 ENCOUNTER — Ambulatory Visit (HOSPITAL_COMMUNITY)
Admission: RE | Admit: 2011-07-01 | Discharge: 2011-07-01 | Disposition: A | Discharge status: Home / Self Care | Payer: Medicaid Other | Source: Ambulatory Visit | Attending: Obstetrics | Admitting: Obstetrics

## 2011-07-01 ENCOUNTER — Encounter (HOSPITAL_COMMUNITY): Payer: Self-pay

## 2011-07-01 ENCOUNTER — Other Ambulatory Visit (HOSPITAL_COMMUNITY): Payer: Medicaid Other

## 2011-07-01 ENCOUNTER — Encounter (HOSPITAL_COMMUNITY): Payer: Medicaid Other

## 2011-07-01 DIAGNOSIS — Z1389 Encounter for screening for other disorder: Secondary | ICD-10-CM | POA: Insufficient documentation

## 2011-07-01 DIAGNOSIS — O352XX0 Maternal care for (suspected) hereditary disease in fetus, not applicable or unspecified: Secondary | ICD-10-CM | POA: Insufficient documentation

## 2011-07-01 DIAGNOSIS — F1920 Other psychoactive substance dependence, uncomplicated: Secondary | ICD-10-CM

## 2011-07-01 DIAGNOSIS — O358XX0 Maternal care for other (suspected) fetal abnormality and damage, not applicable or unspecified: Secondary | ICD-10-CM | POA: Insufficient documentation

## 2011-07-01 DIAGNOSIS — Z363 Encounter for antenatal screening for malformations: Secondary | ICD-10-CM | POA: Insufficient documentation

## 2011-07-01 DIAGNOSIS — G894 Chronic pain syndrome: Secondary | ICD-10-CM

## 2011-07-01 DIAGNOSIS — F1120 Opioid dependence, uncomplicated: Secondary | ICD-10-CM

## 2011-07-01 HISTORY — DX: Anxiety disorder, unspecified: F41.9

## 2011-07-01 HISTORY — DX: Mental disorder, not otherwise specified: F99

## 2011-07-01 HISTORY — DX: Unspecified convulsions: R56.9

## 2011-07-01 HISTORY — DX: Headache: R51

## 2011-07-01 HISTORY — DX: Reserved for concepts with insufficient information to code with codable children: IMO0002
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
## 2011-07-01 NOTE — Progress Notes (Deleted)
Report in [facility]; follow-up as needed.  Fetal umbilical cord cyst noted and genetics counseling performed.

## 2011-07-14 LAB — CBC
MCHC: 34.8
MCV: 86.3
Platelets: 290

## 2011-07-14 LAB — DIFFERENTIAL
Basophils Absolute: 0
Basophils Relative: 0
Eosinophils Absolute: 0
Neutro Abs: 4.7
Neutrophils Relative %: 66

## 2011-07-14 LAB — BASIC METABOLIC PANEL
BUN: 9
CO2: 25
Calcium: 8.7
Chloride: 110
Creatinine, Ser: 0.77

## 2011-07-14 LAB — D-DIMER, QUANTITATIVE: D-Dimer, Quant: 0.22

## 2011-07-14 LAB — PREGNANCY, URINE: Preg Test, Ur: NEGATIVE

## 2011-07-29 LAB — CBC
Hemoglobin: 12.3 g/dL (ref 12.0–15.0)
Platelets: 238 10*3/uL (ref 150–400)
RDW: 12.5 % (ref 11.5–15.5)

## 2011-07-29 LAB — HEPATIC FUNCTION PANEL
ALT: 13 U/L (ref 0–35)
AST: 11 U/L (ref 0–37)
Total Protein: 6.5 g/dL (ref 6.0–8.3)

## 2011-07-29 LAB — DIFFERENTIAL
Basophils Absolute: 0 10*3/uL (ref 0.0–0.1)
Lymphocytes Relative: 20 % (ref 12–46)
Monocytes Absolute: 0.7 10*3/uL (ref 0.1–1.0)
Neutro Abs: 8.5 10*3/uL — ABNORMAL HIGH (ref 1.7–7.7)

## 2011-07-29 LAB — URINALYSIS, ROUTINE W REFLEX MICROSCOPIC
Bilirubin Urine: NEGATIVE
Bilirubin Urine: NEGATIVE
Glucose, UA: NEGATIVE mg/dL
Ketones, ur: NEGATIVE mg/dL
Nitrite: NEGATIVE
Nitrite: POSITIVE — AB
Protein, ur: NEGATIVE mg/dL
Specific Gravity, Urine: 1.025 (ref 1.005–1.030)
Urobilinogen, UA: 1 mg/dL (ref 0.0–1.0)
pH: 6 (ref 5.0–8.0)

## 2011-07-29 LAB — PREGNANCY, URINE
Preg Test, Ur: POSITIVE
Preg Test, Ur: POSITIVE

## 2011-07-29 LAB — BASIC METABOLIC PANEL
Calcium: 9.2 mg/dL (ref 8.4–10.5)
GFR calc non Af Amer: >60 mL/min (ref >60)
Glucose, Bld: 89 mg/dL (ref 70–99)
Sodium: 137 mEq/L (ref 135–145)

## 2011-07-29 LAB — WET PREP, GENITAL: Trich, Wet Prep: NONE SEEN

## 2011-07-29 LAB — URINE MICROSCOPIC-ADD ON

## 2011-07-29 LAB — GC/CHLAMYDIA PROBE AMP, GENITAL: GC Probe Amp, Genital: NEGATIVE

## 2011-07-30 ENCOUNTER — Emergency Department (HOSPITAL_COMMUNITY): Payer: No Typology Code available for payment source

## 2011-07-30 ENCOUNTER — Emergency Department (HOSPITAL_COMMUNITY)
Admission: EM | Admit: 2011-07-30 | Discharge: 2011-07-30 | Disposition: A | Discharge status: Home / Self Care | Payer: No Typology Code available for payment source | Attending: Emergency Medicine | Admitting: Emergency Medicine

## 2011-07-30 ENCOUNTER — Encounter (HOSPITAL_COMMUNITY): Payer: Self-pay | Admitting: Emergency Medicine

## 2011-07-30 DIAGNOSIS — M25569 Pain in unspecified knee: Secondary | ICD-10-CM | POA: Insufficient documentation

## 2011-07-30 DIAGNOSIS — O99891 Other specified diseases and conditions complicating pregnancy: Secondary | ICD-10-CM | POA: Insufficient documentation

## 2011-07-30 DIAGNOSIS — G40909 Epilepsy, unspecified, not intractable, without status epilepticus: Secondary | ICD-10-CM | POA: Insufficient documentation

## 2011-07-30 NOTE — ED Notes (Signed)
RN spoke with Dr Tamela Oddi, told of pt; no abdominal pain or tenderness, fhr reactive and reassuring, no uc's seen, no bleeding, no leaking of fluid, pt to be d/c'd to home with ptl precautions, has follow-up appt with ob on Wednesday.  Pt verbalized understanding.

## 2011-10-07 ENCOUNTER — Other Ambulatory Visit: Payer: Self-pay | Admitting: Obstetrics

## 2011-10-17 ENCOUNTER — Encounter (HOSPITAL_COMMUNITY): Payer: Self-pay | Admitting: *Deleted

## 2011-10-17 ENCOUNTER — Inpatient Hospital Stay (HOSPITAL_COMMUNITY)
Admission: AD | Admit: 2011-10-17 | Discharge: 2011-10-17 | Disposition: A | Discharge status: Home / Self Care | Payer: Medicaid Other | Source: Ambulatory Visit | Attending: Obstetrics | Admitting: Obstetrics

## 2011-10-17 DIAGNOSIS — K5900 Constipation, unspecified: Secondary | ICD-10-CM

## 2011-10-17 DIAGNOSIS — O479 False labor, unspecified: Secondary | ICD-10-CM

## 2011-10-17 DIAGNOSIS — O99619 Diseases of the digestive system complicating pregnancy, unspecified trimester: Secondary | ICD-10-CM

## 2011-10-17 DIAGNOSIS — O4700 False labor before 37 completed weeks of gestation, unspecified trimester: Secondary | ICD-10-CM | POA: Insufficient documentation

## 2011-10-17 LAB — POCT FERN TEST: Fern Test: NEGATIVE

## 2011-10-17 MED ORDER — FERROUS SULFATE 325 (65 FE) MG PO TABS: 325.0000 mg | ORAL_TABLET | Freq: Every day | ORAL | Status: DC

## 2011-10-17 NOTE — ED Provider Notes (Signed)
History     Chief Complaint  Patient presents with  . Laboring   HPIChristana L [name] is 30 y.o. [id number] [redacted]w[redacted]d weeks presenting with ? Rupture of membranes--noticed 2 days ago and She thinks she has lost her mucous plug.  Is a patient of [facility].  Denies vaginal bleeding.  States she is having irregular contractions.  Out of Fe prescription and need refill, constipation X 10days.  Vomited 3 nights ago because of severe indigestion.  Saw blood at the end of vomited.     Past Medical History  Diagnosis Date  . Mental disorder   . Headache   . Preterm labor   . Anxiety   . Fracture of spinal vertebra 2009    Surgery in 2011  . Seizures     Last seizure 2005    Past Surgical History  Procedure Date  . Cervix surgery     partial fractures C1, C2 complete, C5 & C7 partial fractures s/p pinning, plates  . Partial fractured c1, c2 complete, c5 & c7 partial fractures s/p pinning, plates   . Fracture surgery     Family History  Problem Relation Age of Onset  . Arthritis Mother   . Coronary artery disease Father     s/p MIs/ smoker  . Alcohol abuse Father   . Heart attack Father   . Endometriosis Sister   . Aplastic anemia Maternal Grandmother   . Arthritis Maternal Grandmother     s/p hip replacement for arthritis  . Anemia Maternal Grandmother     aplastic  . Scoliosis Maternal Grandfather     s/p surgeries  . Endometriosis Sister   . Cancer Sister     cervical  . Otitis media Daughter     History  Substance Use Topics  . Smoking status: Current Everyday Smoker -- 0.5 packs/day for 17 years    Types: Cigarettes  . Smokeless tobacco: Never Used  . Alcohol Use: No    Allergies: No Known Allergies  Prescriptions prior to admission  Medication Sig Dispense Refill  . clonazePAM (KLONOPIN) 0.5 MG tablet Take 1 tablet (0.5 mg total) by mouth 2 (two) times daily as needed. For anxiety/ panic disorder  30 tablet  0  . metoprolol tartrate (LOPRESSOR) 25 MG tablet Take 1  tablet (25 mg total) by mouth 2 (two) times daily. 1/2 tab  15 tablet  2  . oxyCODONE-acetaminophen (PERCOCET) 7.5-325 MG per tablet Take 1 tablet by mouth 3 (three) times daily as needed. For pain and a 2nd tab prn severe pain  120 tablet  0    Review of Systems  Gastrointestinal: Positive for heartburn and constipation.  Genitourinary:       ? Rupture of membranes   Neg for vaginal bleeding   Physical Exam   Blood pressure 120/63, pulse 101, temperature 98.7 F (37.1 C), temperature source Oral, resp. rate 20, height 5\' 6"  (1.676 m), weight 146 lb (66.225 kg), last menstrual period 02/11/2011.  Physical Exam  Constitutional: She is oriented to person, place, and time. She appears well-developed and well-nourished. No distress.  HENT:  Head: Normocephalic.  Neck: Normal range of motion.  Cardiovascular: Normal rate.   Respiratory: Effort normal.  GI: Soft. She exhibits no distension and no mass. There is no tenderness. There is no rebound and no guarding.  Genitourinary: Uterus is enlarged. Uterus is not tender. No tenderness or bleeding around the vagina.       Pooling of fluid NOT seen on exam.  Neg for bleeding.  Cervix is 1cm dilated.    Neurological: She is alert and oriented to person, place, and time.  Skin: Skin is warm.   Results for orders placed during the hospital encounter of 10/17/11 (from the past 24 hour(s))  WET PREP, GENITAL     Status: Abnormal   Collection Time   10/17/11  1:55 PM      Component Value Range   Yeast, Wet Prep NONE SEEN  NONE SEEN    Trich, Wet Prep NONE SEEN  NONE SEEN    Clue Cells, Wet Prep NONE SEEN  NONE SEEN    WBC, Wet Prep HPF POC MODERATE (*) NONE SEEN    SLIDE FOR FERNING IS NEGATIVE  MAU Course  Procedures  FMS--occ irregular contraction.  Baseline FHR 135.  Moderate variability.  Reactive.  MDM   Assessment and Plan  A:  Braxton Hicks Contractions at [redacted]w[redacted]d  P:  Rx written for Iron at patient's request      Encouraged her  to keep prenatal appt and to report any vaginal bleeding,timeable contractions, leaking of fluid or decreased fetal movements to her doctor.     Encouraged her to use a stool softner at bedtime until stool is soft  Leyton Magoon,EVE M 10/17/2011, 1:31 PM   Matt Holmes, NP 10/17/11 1816

## 2011-10-17 NOTE — Progress Notes (Signed)
Pt had ? Leaking 2 days ago, then had episode of leaking again last night.  Pt states she has been having uc's since she was 4 or 5 months pregnant.  Denies bleeding.  Pt is not leaking presently.

## 2011-10-17 NOTE — ED Notes (Signed)
Pt directly to room 7.

## 2011-10-25 NOTE — L&D Delivery Note (Signed)
Delivery Note At 6:14 PM a viable female was delivered via Vaginal, Spontaneous Delivery (Presentation: ; Occiput Anterior).  APGAR: 9, 9; weight 8 lb 8.2 oz (3861 g).   Placenta status: Intact, Spontaneous.  Cord: 3 vessels with the following complications: None.   Anesthesia: Epidural  Episiotomy: None Lacerations: Labial Suture Repair: 3.0 vicryl rapide Est. Blood Loss (mL):   Mom to postpartum.  Baby to nursery-stable.  [name],[name] Moisan A 11/11/2011, 6:32 PM

## 2011-10-27 LAB — STREP B DNA PROBE: GBS: NEGATIVE

## 2011-10-31 ENCOUNTER — Encounter (INDEPENDENT_AMBULATORY_CARE_PROVIDER_SITE_OTHER): Payer: Medicaid Other | Admitting: General Surgery

## 2011-11-05 ENCOUNTER — Encounter (HOSPITAL_COMMUNITY): Payer: Self-pay | Admitting: *Deleted

## 2011-11-05 ENCOUNTER — Inpatient Hospital Stay (HOSPITAL_COMMUNITY)
Admission: AD | Admit: 2011-11-05 | Discharge: 2011-11-05 | Disposition: A | Discharge status: Home / Self Care | Payer: Medicaid Other | Source: Ambulatory Visit | Attending: Obstetrics | Admitting: Obstetrics

## 2011-11-05 DIAGNOSIS — O99891 Other specified diseases and conditions complicating pregnancy: Secondary | ICD-10-CM | POA: Insufficient documentation

## 2011-11-05 DIAGNOSIS — O479 False labor, unspecified: Secondary | ICD-10-CM | POA: Insufficient documentation

## 2011-11-05 NOTE — Progress Notes (Signed)
Frequent painful contractions, last night felt baby had ?seizure activity, wants ultrasound to make sure baby is okay, wanted cervical check. G3P2 term

## 2011-11-05 NOTE — Progress Notes (Signed)
Also c/o clear fluid that leaks periodically.

## 2011-11-05 NOTE — ED Provider Notes (Signed)
History   Pt presents today c/o leaking fluid and ctx. She states she is uncertain if she is urinating on herself or if her water has broken. She also states she has had increased ctx over the past 2 days. She denies vag bleeding.  Chief Complaint  Patient presents with  . Contractions  . Decreased Fetal Movement   HPI  OB History    Grav Para Term Preterm Abortions TAB SAB Ect Mult Living   3 2 1 1      2       Past Medical History  Diagnosis Date  . Mental disorder   . Headache   . Preterm labor   . Anxiety   . Fracture of spinal vertebra 2009    Surgery in 2011  . Seizures     Last seizure 2005    Past Surgical History  Procedure Date  . Cervix surgery     partial fractures C1, C2 complete, C5 & C7 partial fractures s/p pinning, plates  . Partial fractured c1, c2 complete, c5 & c7 partial fractures s/p pinning, plates   . Fracture surgery     Family History  Problem Relation Age of Onset  . Arthritis Mother   . Coronary artery disease Father     s/p MIs/ smoker  . Alcohol abuse Father   . Heart attack Father   . Endometriosis Sister   . Aplastic anemia Maternal Grandmother   . Arthritis Maternal Grandmother     s/p hip replacement for arthritis  . Anemia Maternal Grandmother     aplastic  . Scoliosis Maternal Grandfather     s/p surgeries  . Endometriosis Sister   . Cancer Sister     cervical  . Otitis media Daughter     History  Substance Use Topics  . Smoking status: Current Everyday Smoker -- 0.5 packs/day for 17 years    Types: Cigarettes  . Smokeless tobacco: Never Used  . Alcohol Use: No    Allergies: No Known Allergies  Prescriptions prior to admission  Medication Sig Dispense Refill  . acetaminophen (TYLENOL) 500 MG tablet Take 500-1,000 mg by mouth daily as needed. For pain       . clonazePAM (KLONOPIN) 0.5 MG tablet Take 1 tablet (0.5 mg total) by mouth 2 (two) times daily as needed. For anxiety/ panic disorder  30 tablet  0  .  ferrous sulfate (FERROUSUL) 325 (65 FE) MG tablet Take 1 tablet (325 mg total) by mouth daily with breakfast.  30 tablet  11  . loperamide (IMODIUM) 2 MG capsule Take 2 mg by mouth daily as needed. For diarrhea       . oxyCODONE-acetaminophen (PERCOCET) 10-325 MG per tablet Take 1 tablet by mouth daily as needed. For severe pain       . Prenatal Vit-Fe Fumarate-FA (PRENATAL MULTIVITAMIN) TABS Take 1 tablet by mouth daily.        Review of Systems  Constitutional: Negative for fever.  Eyes: Negative for blurred vision.  Cardiovascular: Negative for chest pain.  Gastrointestinal: Positive for abdominal pain. Negative for nausea, vomiting, diarrhea and constipation.  Genitourinary: Negative for dysuria, urgency, frequency and hematuria.  Neurological: Negative for dizziness and headaches.  Psychiatric/Behavioral: Negative for depression and suicidal ideas.   Physical Exam   Blood pressure 114/61, pulse 102, temperature 98.7 F (37.1 C), temperature source Oral, resp. rate 16, height 5\' 6"  (1.676 m), weight 152 lb 9.6 oz (69.219 kg), last menstrual period 02/11/2011.  Physical Exam  Nursing note and vitals reviewed. Constitutional: She is oriented to person, place, and time. She appears well-developed and well-nourished. No distress.  HENT:  Head: Normocephalic and atraumatic.  Eyes: EOM are normal. Pupils are equal, round, and reactive to light.  GI: Soft. She exhibits no distension. There is no tenderness. There is no rebound and no guarding.  Genitourinary: No bleeding around the vagina. No vaginal discharge found.       Cervix 2-3/80/-2. Fern test negative.  Neurological: She is alert and oriented to person, place, and time.  Skin: Skin is warm and dry. She is not diaphoretic.  Psychiatric: She has a normal mood and affect. Her behavior is normal. Judgment and thought content normal.    MAU Course  Procedures  Discussed pt with Dr. Gaynell [name]. Will have pt walk for a couple of  hours and recheck cervix.  After 1 hour of walking pt cervix rechecked. Cervix with slight change. 3/80/-2 with bulging membranes. Discussed with Dr. Gaynell [name]. Will allow pt to walk for 1 more hour.  Pt now desires to go home. She states she can walk at home and will return if her sx worsen.  Assessment and Plan  Braxton Hicks: pt has made no significant cervical change. Discussed signs and sx of labor. Pt will f/u in Dr. Verdell [name] office on Monday or return to the MAU if needed. Discussed diet, activity, risks, and precautions.  [name] [name], DrHSc, MPAS, PA-C  11/05/2011, 11:58 AM   [name] Hoover, PA 11/05/11 1338

## 2011-11-07 ENCOUNTER — Other Ambulatory Visit: Payer: Self-pay | Admitting: Obstetrics

## 2011-11-07 ENCOUNTER — Other Ambulatory Visit: Payer: Self-pay | Admitting: Obstetrics & Gynecology

## 2011-11-07 DIAGNOSIS — Z23 Encounter for immunization: Secondary | ICD-10-CM

## 2011-11-07 DIAGNOSIS — N949 Unspecified condition associated with female genital organs and menstrual cycle: Secondary | ICD-10-CM

## 2011-11-08 ENCOUNTER — Other Ambulatory Visit: Payer: Self-pay | Admitting: Obstetrics

## 2011-11-08 DIAGNOSIS — N949 Unspecified condition associated with female genital organs and menstrual cycle: Secondary | ICD-10-CM

## 2011-11-09 ENCOUNTER — Telehealth (HOSPITAL_COMMUNITY): Payer: Self-pay | Admitting: *Deleted

## 2011-11-09 ENCOUNTER — Encounter (HOSPITAL_COMMUNITY): Payer: Self-pay | Admitting: *Deleted

## 2011-11-09 NOTE — Telephone Encounter (Signed)
Preadmission screen  

## 2011-11-10 ENCOUNTER — Telehealth (HOSPITAL_COMMUNITY): Payer: Self-pay | Admitting: *Deleted

## 2011-11-10 NOTE — Telephone Encounter (Signed)
Preadmission screen  

## 2011-11-11 ENCOUNTER — Encounter (HOSPITAL_COMMUNITY): Payer: Self-pay | Admitting: *Deleted

## 2011-11-11 ENCOUNTER — Inpatient Hospital Stay (HOSPITAL_COMMUNITY)
Admission: AD | Admit: 2011-11-11 | Discharge: 2011-11-13 | DRG: 775 | Disposition: A | Discharge status: Home / Self Care | Payer: Medicaid Other | Source: Ambulatory Visit | Attending: Obstetrics & Gynecology | Admitting: Obstetrics & Gynecology

## 2011-11-11 ENCOUNTER — Inpatient Hospital Stay (HOSPITAL_COMMUNITY): Payer: Medicaid Other | Admitting: Anesthesiology

## 2011-11-11 ENCOUNTER — Encounter (HOSPITAL_COMMUNITY): Payer: Self-pay | Admitting: Anesthesiology

## 2011-11-11 ENCOUNTER — Encounter (HOSPITAL_COMMUNITY): Payer: Self-pay | Admitting: Obstetrics & Gynecology

## 2011-11-11 DIAGNOSIS — IMO0001 Reserved for inherently not codable concepts without codable children: Secondary | ICD-10-CM

## 2011-11-11 LAB — CBC
HCT: 36.2 % (ref 36.0–46.0)
Hemoglobin: 12.6 g/dL (ref 12.0–15.0)
RDW: 13.5 % (ref 11.5–15.5)
WBC: 26.3 10*3/uL — ABNORMAL HIGH (ref 4.0–10.5)

## 2011-11-11 LAB — ABO/RH: RH Type: POSITIVE

## 2011-11-11 MED ORDER — MEASLES, MUMPS & RUBELLA VAC ~~LOC~~ INJ
0.5000 mL | INJECTION | Freq: Once | SUBCUTANEOUS | Status: DC
  Filled 2011-11-11: qty 0.5

## 2011-11-11 MED ORDER — OXYCODONE-ACETAMINOPHEN 5-325 MG PO TABS
1.0000 | ORAL_TABLET | ORAL | Status: DC | PRN
  Administered 2011-11-12: 1 via ORAL
  Administered 2011-11-12 – 2011-11-13 (×4): 2 via ORAL
  Administered 2011-11-13: 1 via ORAL
  Administered 2011-11-13: 2 via ORAL
  Filled 2011-11-11 (×2): qty 2
  Filled 2011-11-11 (×2): qty 1
  Filled 2011-11-11 (×3): qty 2

## 2011-11-11 MED ORDER — FENTANYL 2.5 MCG/ML BUPIVACAINE 1/10 % EPIDURAL INFUSION (WH - ANES)
14.0000 mL/h | INTRAMUSCULAR | Status: DC
  Administered 2011-11-11: 14 mL/h via EPIDURAL
  Filled 2011-11-11 (×2): qty 60

## 2011-11-11 MED ORDER — ONDANSETRON HCL 4 MG/2ML IJ SOLN: 4.0000 mg | Freq: Four times a day (QID) | INTRAMUSCULAR | Status: DC | PRN

## 2011-11-11 MED ORDER — FLEET ENEMA 7-19 GM/118ML RE ENEM: 1.0000 | ENEMA | RECTAL | Status: DC | PRN

## 2011-11-11 MED ORDER — BUTORPHANOL TARTRATE 2 MG/ML IJ SOLN: 1.0000 mg | INTRAMUSCULAR | Status: DC | PRN

## 2011-11-11 MED ORDER — MAGNESIUM HYDROXIDE 400 MG/5ML PO SUSP: 30.0000 mL | ORAL | Status: DC | PRN

## 2011-11-11 MED ORDER — PHENYLEPHRINE 40 MCG/ML (10ML) SYRINGE FOR IV PUSH (FOR BLOOD PRESSURE SUPPORT)
80.0000 ug | PREFILLED_SYRINGE | INTRAVENOUS | Status: DC | PRN
  Filled 2011-11-11 (×2): qty 5

## 2011-11-11 MED ORDER — EPHEDRINE 5 MG/ML INJ
10.0000 mg | INTRAVENOUS | Status: DC | PRN
  Filled 2011-11-11: qty 4

## 2011-11-11 MED ORDER — OXYTOCIN 20 UNITS IN LACTATED RINGERS INFUSION - SIMPLE
125.0000 mL/h | Freq: Once | INTRAVENOUS | Status: AC
  Administered 2011-11-11: 125 mL/h via INTRAVENOUS
  Filled 2011-11-11: qty 1000

## 2011-11-11 MED ORDER — OXYCODONE-ACETAMINOPHEN 5-325 MG PO TABS: 2.0000 | ORAL_TABLET | ORAL | Status: DC | PRN

## 2011-11-11 MED ORDER — MEDROXYPROGESTERONE ACETATE 150 MG/ML IM SUSP: 150.0000 mg | INTRAMUSCULAR | Status: DC | PRN

## 2011-11-11 MED ORDER — BENZOCAINE-MENTHOL 20-0.5 % EX AERO
1.0000 "application " | INHALATION_SPRAY | CUTANEOUS | Status: DC | PRN
  Administered 2011-11-12: 1 via TOPICAL

## 2011-11-11 MED ORDER — TETANUS-DIPHTH-ACELL PERTUSSIS 5-2.5-18.5 LF-MCG/0.5 IM SUSP
0.5000 mL | Freq: Once | INTRAMUSCULAR | Status: AC
  Administered 2011-11-12: 0.5 mL via INTRAMUSCULAR
  Filled 2011-11-11: qty 0.5

## 2011-11-11 MED ORDER — PHENYLEPHRINE 40 MCG/ML (10ML) SYRINGE FOR IV PUSH (FOR BLOOD PRESSURE SUPPORT)
80.0000 ug | PREFILLED_SYRINGE | INTRAVENOUS | Status: DC | PRN
  Filled 2011-11-11: qty 5

## 2011-11-11 MED ORDER — DIPHENHYDRAMINE HCL 25 MG PO CAPS: 25.0000 mg | ORAL_CAPSULE | Freq: Four times a day (QID) | ORAL | Status: DC | PRN

## 2011-11-11 MED ORDER — WITCH HAZEL-GLYCERIN EX PADS
1.0000 "application " | MEDICATED_PAD | CUTANEOUS | Status: DC | PRN
  Administered 2011-11-12: 1 via TOPICAL

## 2011-11-11 MED ORDER — OXYTOCIN 20 UNITS IN LACTATED RINGERS INFUSION - SIMPLE
1.0000 m[IU]/min | INTRAVENOUS | Status: DC
  Administered 2011-11-11: 2 m[IU]/min via INTRAVENOUS
  Administered 2011-11-11: 333 m[IU]/min via INTRAVENOUS

## 2011-11-11 MED ORDER — IBUPROFEN 600 MG PO TABS
600.0000 mg | ORAL_TABLET | Freq: Four times a day (QID) | ORAL | Status: DC
  Administered 2011-11-12 – 2011-11-13 (×7): 600 mg via ORAL
  Filled 2011-11-11 (×7): qty 1

## 2011-11-11 MED ORDER — LIDOCAINE HCL 1.5 % IJ SOLN
INTRAMUSCULAR | Status: DC | PRN
  Administered 2011-11-11 (×2): 4 mL via EPIDURAL

## 2011-11-11 MED ORDER — ONDANSETRON HCL 4 MG/2ML IJ SOLN: 4.0000 mg | INTRAMUSCULAR | Status: DC | PRN

## 2011-11-11 MED ORDER — DIBUCAINE 1 % RE OINT
1.0000 "application " | TOPICAL_OINTMENT | RECTAL | Status: DC | PRN
  Administered 2011-11-12: 1 via RECTAL
  Filled 2011-11-11: qty 28

## 2011-11-11 MED ORDER — OXYTOCIN BOLUS FROM INFUSION
500.0000 mL | Freq: Once | INTRAVENOUS | Status: DC
  Filled 2011-11-11: qty 500

## 2011-11-11 MED ORDER — IBUPROFEN 600 MG PO TABS: 600.0000 mg | ORAL_TABLET | Freq: Four times a day (QID) | ORAL | Status: DC | PRN

## 2011-11-11 MED ORDER — DIPHENHYDRAMINE HCL 50 MG/ML IJ SOLN: 12.5000 mg | INTRAMUSCULAR | Status: DC | PRN

## 2011-11-11 MED ORDER — FENTANYL 2.5 MCG/ML BUPIVACAINE 1/10 % EPIDURAL INFUSION (WH - ANES)
INTRAMUSCULAR | Status: DC | PRN
  Administered 2011-11-11: 14 mL/h via EPIDURAL

## 2011-11-11 MED ORDER — LACTATED RINGERS IV SOLN
INTRAVENOUS | Status: DC
  Administered 2011-11-11 (×2): via INTRAVENOUS

## 2011-11-11 MED ORDER — ONDANSETRON HCL 4 MG PO TABS: 4.0000 mg | ORAL_TABLET | ORAL | Status: DC | PRN

## 2011-11-11 MED ORDER — INFLUENZA VIRUS VACC SPLIT PF IM SUSP: 0.5000 mL | INTRAMUSCULAR | Status: DC | PRN

## 2011-11-11 MED ORDER — LACTATED RINGERS IV SOLN: 500.0000 mL | INTRAVENOUS | Status: DC | PRN

## 2011-11-11 MED ORDER — LANOLIN HYDROUS EX OINT: TOPICAL_OINTMENT | CUTANEOUS | Status: DC | PRN

## 2011-11-11 MED ORDER — SENNOSIDES-DOCUSATE SODIUM 8.6-50 MG PO TABS
2.0000 | ORAL_TABLET | Freq: Every day | ORAL | Status: DC
  Administered 2011-11-12: 2 via ORAL

## 2011-11-11 MED ORDER — ACETAMINOPHEN 325 MG PO TABS: 650.0000 mg | ORAL_TABLET | ORAL | Status: DC | PRN

## 2011-11-11 MED ORDER — ZOLPIDEM TARTRATE 5 MG PO TABS: 5.0000 mg | ORAL_TABLET | Freq: Every evening | ORAL | Status: DC | PRN

## 2011-11-11 MED ORDER — EPHEDRINE 5 MG/ML INJ
10.0000 mg | INTRAVENOUS | Status: DC | PRN
  Filled 2011-11-11 (×2): qty 4

## 2011-11-11 MED ORDER — LACTATED RINGERS IV SOLN: 500.0000 mL | Freq: Once | INTRAVENOUS | Status: DC

## 2011-11-11 MED ORDER — TERBUTALINE SULFATE 1 MG/ML IJ SOLN: 0.2500 mg | Freq: Once | INTRAMUSCULAR | Status: DC | PRN

## 2011-11-11 MED ORDER — PRENATAL MULTIVITAMIN CH
1.0000 | ORAL_TABLET | Freq: Every day | ORAL | Status: DC
  Administered 2011-11-12 – 2011-11-13 (×2): 1 via ORAL
  Filled 2011-11-11: qty 1

## 2011-11-11 MED ORDER — LIDOCAINE HCL (PF) 1 % IJ SOLN
30.0000 mL | INTRAMUSCULAR | Status: DC | PRN
  Administered 2011-11-11: 30 mL via SUBCUTANEOUS
  Filled 2011-11-11: qty 30

## 2011-11-11 MED ORDER — FERROUS SULFATE 325 (65 FE) MG PO TABS
325.0000 mg | ORAL_TABLET | Freq: Two times a day (BID) | ORAL | Status: DC
  Administered 2011-11-12 – 2011-11-13 (×3): 325 mg via ORAL
  Filled 2011-11-11 (×2): qty 1

## 2011-11-11 MED ORDER — CITRIC ACID-SODIUM CITRATE 334-500 MG/5ML PO SOLN: 30.0000 mL | ORAL | Status: DC | PRN

## 2011-11-11 NOTE — ED Notes (Signed)
Pt has been in a couple of times for ?SROM; she states that she has been leaking since 1300 yesterday; states that fluid started out a green color, then turned yellow and now is bloody; c/o cramping that she rates @ 7;

## 2011-11-11 NOTE — Anesthesia Procedure Notes (Signed)
Epidural Patient location during procedure: OB Start time: 11/11/2011 1:19 PM  Staffing Anesthesiologist: Anaijah Augsburger A.  Preanesthetic Checklist Completed: patient identified, site marked, surgical consent, pre-op evaluation, timeout performed, IV checked, risks and benefits discussed and monitors and equipment checked  Epidural Patient position: sitting Prep: site prepped and draped and DuraPrep Patient monitoring: continuous pulse ox and blood pressure Approach: midline Injection technique: LOR air  Needle:  Needle type: Tuohy  Needle gauge: 17 G Needle length: 9 cm Needle insertion depth: 4 cm Catheter type: closed end flexible Catheter size: 19 Gauge Catheter at skin depth: 9 cm Test dose: negative and 1.5% lidocaine  Assessment Events: blood not aspirated, injection not painful, no injection resistance, negative IV test and no paresthesia  Additional Notes Patient identified. Risks and benefits discussed including failed block, incomplete  Pain control, post dural puncture headache, nerve damage, paralysis, blood pressure Changes, nausea, vomiting, reactions to medications-both toxic and allergic and post Partum back pain. All questions were answered. Patient expressed understanding and wished to proceed. Sterile technique was used throughout procedure. Epidural site was Dressed with sterile barrier dressing. No paresthesias, signs of intravascular injection Or signs of intrathecal spread were encountered.  Patient was more comfortable after the epidural was dosed. Please see RN's note for documentation of vital signs and FHR which are stable.

## 2011-11-11 NOTE — Progress Notes (Signed)
Patient state she has been leaking fluid since 1300 1-17. Started as yellow now more orange. Having some contractions. Reports good fetal movement. Was 3 cm on 1-14 and had membranes stripped.

## 2011-11-11 NOTE — ED Notes (Signed)
Fern slide is negative 

## 2011-11-11 NOTE — Progress Notes (Signed)
Patients mother did not respond well to visitation policy. Stated they were told no limit after delivery. Apoligized for any confusion, explained limit of 3, could rotate in and out. Mother still unhappy. Additional family members did leave the unit. Kenney Houseman talked to patient and mother, unable to satisfy. Tanya to be notified of any additional problems.  Rosebud Poles, RN

## 2011-11-11 NOTE — Anesthesia Postprocedure Evaluation (Signed)
  Anesthesia Post-op Note  Patient: [name] [name]  Procedure(s) Performed: * No procedures listed *  Patient Location: Mother/Baby  Anesthesia Type: Epidural  Level of Consciousness: awake, alert  and oriented  Airway and Oxygen Therapy: Patient Spontanous Breathing  Post-op Pain: mild  Post-op Assessment: Patient's Cardiovascular Status Stable, Respiratory Function Stable, Patent Airway and Pain level controlled  Post-op Vital Signs: stable  Complications: No apparent anesthesia complications

## 2011-11-11 NOTE — ED Provider Notes (Signed)
History     Chief Complaint  Patient presents with  . Rupture of Membranes   HPI Pt is 39 weeks scheduled for induction next week presents with leakage of yellow vaginal fluid at 1300 on 1/17 and now having some vaginal bleeding with contractions.  Pt states she had a lot of leakage of fluid down her leg.  Past Medical History  Diagnosis Date  . Mental disorder   . Headache   . Preterm labor   . Anxiety   . Fracture of spinal vertebra 2009    Surgery in 2011  . Seizures     Last seizure 2005  . Hemorrhoids thrombosed     Past Surgical History  Procedure Date  . Cervical fusion 05/26/2011    She thinks it was C2    Family History  Problem Relation Age of Onset  . Arthritis Mother   . Coronary artery disease Father     s/p MIs/ smoker  . Alcohol abuse Father   . Heart attack Father   . Endometriosis Sister   . Aplastic anemia Maternal Grandmother   . Arthritis Maternal Grandmother     s/p hip replacement for arthritis  . Anemia Maternal Grandmother     aplastic  . Scoliosis Maternal Grandfather     s/p surgeries  . Endometriosis Sister   . Cancer Sister     cervical  . Otitis media Daughter     History  Substance Use Topics  . Smoking status: Current Everyday Smoker -- 0.5 packs/day for 17 years    Types: Cigarettes  . Smokeless tobacco: Never Used  . Alcohol Use: No    Allergies: No Known Allergies  Prescriptions prior to admission  Medication Sig Dispense Refill  . acetaminophen (TYLENOL) 500 MG tablet Take 500-1,000 mg by mouth daily as needed. For pain       . clonazePAM (KLONOPIN) 0.5 MG tablet Take 1 tablet (0.5 mg total) by mouth 2 (two) times daily as needed. For anxiety/ panic disorder  30 tablet  0  . ferrous sulfate (FERROUSUL) 325 (65 FE) MG tablet Take 1 tablet (325 mg total) by mouth daily with breakfast.  30 tablet  11  . loperamide (IMODIUM) 2 MG capsule Take 2 mg by mouth daily as needed. For diarrhea       . oxyCODONE-acetaminophen  (PERCOCET) 10-325 MG per tablet Take 1 tablet by mouth daily as needed. For severe pain       . Prenatal Vit-Fe Fumarate-FA (PRENATAL MULTIVITAMIN) TABS Take 1 tablet by mouth daily.        ROS Physical Exam   Blood pressure 110/73, pulse 120, temperature 98.5 F (36.9 C), temperature source Oral, resp. rate 18, height 5\' 6"  (1.676 m), weight 148 lb (67.132 kg), last menstrual period 02/11/2011, SpO2 99.00%.  Physical Exam  MAU Course  Procedures Pelvic exam - mod amount of blood tinged watery discharge in vault; cervix 4 cm thin.   Reported to Dr. Tamela Oddi by RN- pt to be admitted Assessment and Plan    [name] [name] 11/11/2011, 11:40 AM

## 2011-11-11 NOTE — H&P (Signed)
[name] [name] is a 31 y.o. female presenting c/o contractions. Maternal Medical History:  Reason for admission: Reason for admission: contractions.  Contractions: Frequency: irregular.    Fetal activity: Perceived fetal activity is normal.    Prenatal complications: no prenatal complications   OB History    Grav Para Term Preterm Abortions TAB SAB Ect Mult Living   3 2 1 1      2      Past Medical History  Diagnosis Date  . Mental disorder   . Headache   . Preterm labor   . Anxiety   . Fracture of spinal vertebra 2009    Surgery in 2011  . Seizures     Last seizure 2005  . Hemorrhoids thrombosed    Past Surgical History  Procedure Date  . Cervical fusion 05/26/2011    She thinks it was C2   Family History: family history includes Alcohol abuse in her father; Anemia in her maternal grandmother; Aplastic anemia in her maternal grandmother; Arthritis in her maternal grandmother and mother; Cancer in her sister; Coronary artery disease in her father; Endometriosis in her sisters; Heart attack in her father; Otitis media in her daughter; and Scoliosis in her maternal grandfather. Social History:  reports that she has been smoking Cigarettes.  She has a 8.5 pack-year smoking history. She has never used smokeless tobacco. She reports that she does not drink alcohol or use illicit drugs.  Review of Systems  Constitutional: Negative for fever.  Eyes: Negative for blurred vision.  Respiratory: Negative for shortness of breath.   Gastrointestinal: Negative for vomiting.  Skin: Negative for rash.  Neurological: Negative for headaches.    Dilation: 4 Effacement (%): 80 Station: -2 Exam by:: Morrison Old RN and Pamelia Hoit NP Blood pressure 104/57, pulse 91, temperature 98.7 F (37.1 C), temperature source Oral, resp. rate 16, height 5\' 6"  (1.676 m), weight 67.132 kg (148 lb), last menstrual period 02/11/2011, SpO2 100.00%. Maternal Exam:  Uterine Assessment: Contraction  strength is moderate.  Contraction frequency is irregular.   Abdomen: Patient reports no abdominal tenderness. Fetal presentation: vertex  Introitus: not evaluated.     Fetal Exam Fetal Monitor Review: Variability: moderate (6-25 bpm).   Pattern: accelerations present and no decelerations.    Fetal State Assessment: Category I - tracings are normal.     Physical Exam  Constitutional: She appears well-developed.  HENT:  Head: Normocephalic.  Neck: Neck supple. No thyromegaly present.  Cardiovascular: Normal rate and regular rhythm.   Respiratory: Breath sounds normal.  GI: Soft. Bowel sounds are normal.  Skin: No rash noted.    Prenatal labs: ABO, Rh: O/--/-- (07/31 0000) Antibody:   Rubella: Immune (07/31 0000) RPR: Nonreactive (07/31 0000)  HBsAg: Negative (07/31 0000)  HIV: Non-reactive (07/31 0000)  GBS: Negative (01/03 0000)   Assessment/Plan: Multipara at term, active labor, Category 1 FHT Admit, anticipate an NSVD  JACKSON-MOORE,Chayden Garrelts A 11/11/2011, 2:39 PM

## 2011-11-11 NOTE — Progress Notes (Signed)
[name] [name] is a 31 y.o. G3P1102 at [redacted]w[redacted]d by LMP admitted for active labor  Subjective: Comfortable  Objective: BP 114/60  Pulse 80  Temp(Src) 98.4 F (36.9 C) (Oral)  Resp 16  Ht 5\' 6"  (1.676 m)  Wt 67.132 kg (148 lb)  BMI 23.89 kg/m2  SpO2 100%  LMP 02/11/2011   Total I/O In: -  Out: 500 [Urine:500]  FHT:  FHR: 140 bpm, variability: moderate,  accelerations:  Present,  decelerations:  Absent UC:   regular, every 2 minutes SVE:   Dilation: 5 Effacement (%): 90 Station: +1 Exam by:: Dr. Tamela Oddi IUPC placed Labs: Lab Results  Component Value Date   WBC 26.3* 11/11/2011   HGB 12.6 11/11/2011   HCT 36.2 11/11/2011   MCV 96.0 11/11/2011   PLT 263 11/11/2011    Assessment / Plan: Protracted latent phase  Labor: Monitor progress Preeclampsia:  N/A Fetal Wellbeing:  Category I Pain Control:  Epidural I/D:  n/a Anticipated MOD:  NSVD  JACKSON-MOORE,Jessee Mezera A 11/11/2011, 5:02 PM

## 2011-11-11 NOTE — Anesthesia Preprocedure Evaluation (Signed)
Anesthesia Evaluation  Patient identified by MRN, date of birth, ID band Patient awake    Reviewed: Allergy & Precautions, H&P , Patient's Chart, lab work & pertinent test results  Airway Mallampati: III TM Distance: >3 FB Neck ROM: full    Dental No notable dental hx. (+) Teeth Intact   Pulmonary neg pulmonary ROS,  clear to auscultation  Pulmonary exam normal       Cardiovascular neg cardio ROS regular Normal    Neuro/Psych  Headaches, Seizures -,  PSYCHIATRIC DISORDERS Panic attacks     GI/Hepatic negative GI ROS, Neg liver ROS,   Endo/Other  Negative Endocrine ROS  Renal/GU negative Renal ROS  Genitourinary negative   Musculoskeletal   Abdominal Normal abdominal exam  (+)   Peds  Hematology negative hematology ROS (+)   Anesthesia Other Findings   Reproductive/Obstetrics (+) Pregnancy                           Anesthesia Physical Anesthesia Plan  ASA: II  Anesthesia Plan: Epidural   Post-op Pain Management:    Induction:   Airway Management Planned:   Additional Equipment:   Intra-op Plan:   Post-operative Plan:   Informed Consent: I have reviewed the patients History and Physical, chart, labs and discussed the procedure including the risks, benefits and alternatives for the proposed anesthesia with the patient or authorized representative who has indicated his/her understanding and acceptance.   Dental Advisory Given  Plan Discussed with: Anesthesiologist and Surgeon  Anesthesia Plan Comments:         Anesthesia Quick Evaluation

## 2011-11-12 LAB — CBC
MCV: 96.4 fL (ref 78.0–100.0)
Platelets: 227 10*3/uL (ref 150–400)
RDW: 13.5 % (ref 11.5–15.5)
WBC: 24.5 10*3/uL — ABNORMAL HIGH (ref 4.0–10.5)

## 2011-11-12 MED ORDER — BENZOCAINE-MENTHOL 20-0.5 % EX AERO
INHALATION_SPRAY | CUTANEOUS | Status: AC
  Administered 2011-11-12: 1 via TOPICAL
  Filled 2011-11-12: qty 56

## 2011-11-12 NOTE — Progress Notes (Signed)
Patient ID: [name] [name], female   DOB: 05-19-1981, 31 y.o.   MRN: [id number] Post Partum Day 1 S/P spontaneous vaginal RH status/Rubella reviewed.  Feeding: breast Subjective: No HA, SOB, CP, F/C, breast symptoms. Normal vaginal bleeding, no clots.     Objective: BP 94/58  Pulse 72  Temp(Src) 97.6 F (36.4 C) (Oral)  Resp 20  Ht 5\' 6"  (1.676 m)  Wt 67.132 kg (148 lb)  BMI 23.89 kg/m2  SpO2 97%  LMP 02/11/2011  Breastfeeding? Unknown   Physical Exam:  General: alert Lochia: appropriate Uterine Fundus: firm DVT Evaluation: No evidence of DVT seen on physical exam. Ext: No c/c/e  Basename 11/12/11 0544 11/11/11 1215  HGB 11.0* 12.6  HCT 31.9* 36.2      Assessment/Plan: 31 y.o.  PPD #1 .  normal postpartum exam Continue current postpartum care  Ambulate   LOS: 1 day   JACKSON-MOORE,Varetta Chavers A 11/12/2011, 11:03 AM

## 2011-11-12 NOTE — Progress Notes (Signed)
Called Dr. Tamela Oddi, information provided re: patient concern. Candise Che, RN

## 2011-11-13 LAB — CBC
Hemoglobin: 11 g/dL — ABNORMAL LOW (ref 12.0–15.0)
MCHC: 33.3 g/dL (ref 30.0–36.0)

## 2011-11-13 LAB — DIFFERENTIAL
Basophils Absolute: 0 10*3/uL (ref 0.0–0.1)
Basophils Relative: 0 % (ref 0–1)
Eosinophils Absolute: 0.2 10*3/uL (ref 0.0–0.7)
Lymphocytes Relative: 25 % (ref 12–46)
Neutrophils Relative %: 67 % (ref 43–77)

## 2011-11-13 MED ORDER — IBUPROFEN 600 MG PO TABS: 600.0000 mg | ORAL_TABLET | Freq: Four times a day (QID) | ORAL | Status: AC

## 2011-11-13 NOTE — Progress Notes (Signed)

## 2011-11-13 NOTE — Progress Notes (Signed)
Patient ID: [name] [name], female   DOB: 03-Feb-1981, 31 y.o.   MRN: [id number] Post Partum Day #2 S/P:spontaneous vaginal  RH status/Rubella reviewed.  Feeding: unknown Subjective: No HA, SOB, CP, F/C, breast symptoms: no. Normal vaginal bleeding, no clots.     Objective:  Blood pressure 100/63, pulse 66, temperature 97.5 F (36.4 C), temperature source Oral, resp. rate 18, height 5\' 6"  (1.676 m), weight 67.132 kg (148 lb), last menstrual period 02/11/2011, SpO2 97.00%, unknown if currently breastfeeding.   Physical Exam:  General: alert Lochia: appropriate Uterine Fundus: firm DVT Evaluation: No evidence of DVT seen on physical exam. Ext: No c/c/e  Basename 11/13/11 0600 11/12/11 0544  HGB 11.0* 11.0*  HCT 33.0* 31.9*    Assessment/Plan: 31 y.o.  PPD # 2 .  normal postpartum exam patient is [name] candidate for Nexplanon for contraception, with no contraindications Continue current postpartum care D/C home   LOS: 2 days   [name] [name] [name] [name] 11/13/2011, 11:15 AM

## 2011-11-13 NOTE — Discharge Summary (Signed)
  Obstetric Discharge Summary Reason for Admission: onset of labor Prenatal Procedures: none Intrapartum Procedures: spontaneous vaginal delivery Postpartum Procedures: none Complications-Operative and Postpartum: none  Hemoglobin  Date Value Range Status  11/13/2011 11.0* 12.0-15.0 (g/dL) Final     HCT  Date Value Range Status  11/13/2011 33.0* 36.0-46.0 (%) Final    Discharge Diagnoses: Term Pregnancy-delivered  Discharge Information: Date: 11/13/2011 Activity: pelvic rest Diet: routine Medications:  Prior to Admission medications   Medication Sig Start Date End Date Taking? Authorizing Provider  acetaminophen (TYLENOL) 500 MG tablet Take 500-1,000 mg by mouth daily as needed. For pain    Yes Historical Provider, MD  clonazePAM (KLONOPIN) 0.5 MG tablet Take 1 tablet (0.5 mg total) by mouth 2 (two) times daily as needed. For anxiety/ panic disorder 03/25/11  Yes Jeoffrey Massed, MD  ferrous sulfate (FERROUSUL) 325 (65 FE) MG tablet Take 1 tablet (325 mg total) by mouth daily with breakfast. 10/17/11 10/16/12 Yes Eve M Key, NP  hydrocortisone (ANUSOL-HC) 25 MG suppository Place 25 mg rectally 2 (two) times daily.   Yes Historical Provider, MD  lidocaine-hydrocortisone (ANAMANTEL HC) 3-0.5 % CREA Place 1 Applicatorful rectally daily as needed. For Hemorids.   Yes Historical Provider, MD  oxyCODONE-acetaminophen (PERCOCET) 10-325 MG per tablet Take 1 tablet by mouth daily as needed. For severe pain    Yes Historical Provider, MD  Prenatal Vit-Fe Fumarate-FA (PRENATAL MULTIVITAMIN) TABS Take 1 tablet by mouth daily.   Yes Historical Provider, MD  ibuprofen (ADVIL,MOTRIN) 600 MG tablet Take 1 tablet (600 mg total) by mouth every 6 (six) hours. 11/13/11 11/23/11  Roseanna Rainbow, MD    Condition: stable Instructions: refer to routine discharge instructions Discharge to: home Follow-up Information    Follow up with HARPER,CHARLES A, MD. Schedule an appointment as soon as possible for  a visit in 2 weeks.   Contact information:   [address] [place] [address] [phone]          Newborn Data: Live born  Information for the patient's newborn:  Jasara, Corrigan [[id number]]  female ; APGAR , ; weight ;  Home with mother.  JACKSON-MOORE,Yitzchok Carriger A 11/13/2011, 11:21 AM

## 2011-11-14 NOTE — Progress Notes (Signed)
Post discharge chart review completed.  

## 2011-11-15 ENCOUNTER — Inpatient Hospital Stay (HOSPITAL_COMMUNITY): Admission: RE | Admit: 2011-11-15 | Payer: Medicaid Other | Source: Ambulatory Visit

## 2011-11-21 ENCOUNTER — Ambulatory Visit (INDEPENDENT_AMBULATORY_CARE_PROVIDER_SITE_OTHER): Payer: Medicaid Other | Admitting: Family Medicine

## 2011-11-21 ENCOUNTER — Encounter: Payer: Self-pay | Admitting: Family Medicine

## 2011-11-21 ENCOUNTER — Other Ambulatory Visit: Payer: Self-pay | Admitting: Family Medicine

## 2011-11-21 DIAGNOSIS — F53 Postpartum depression: Secondary | ICD-10-CM

## 2011-11-21 DIAGNOSIS — R5383 Other fatigue: Secondary | ICD-10-CM

## 2011-11-21 DIAGNOSIS — J019 Acute sinusitis, unspecified: Secondary | ICD-10-CM

## 2011-11-21 DIAGNOSIS — F341 Dysthymic disorder: Secondary | ICD-10-CM

## 2011-11-21 DIAGNOSIS — S129XXA Fracture of neck, unspecified, initial encounter: Secondary | ICD-10-CM

## 2011-11-21 DIAGNOSIS — F17200 Nicotine dependence, unspecified, uncomplicated: Secondary | ICD-10-CM

## 2011-11-21 DIAGNOSIS — IMO0002 Reserved for concepts with insufficient information to code with codable children: Secondary | ICD-10-CM

## 2011-11-21 DIAGNOSIS — M546 Pain in thoracic spine: Secondary | ICD-10-CM

## 2011-11-21 DIAGNOSIS — G8929 Other chronic pain: Secondary | ICD-10-CM

## 2011-11-21 DIAGNOSIS — F419 Anxiety disorder, unspecified: Secondary | ICD-10-CM

## 2011-11-21 DIAGNOSIS — J029 Acute pharyngitis, unspecified: Secondary | ICD-10-CM

## 2011-11-21 DIAGNOSIS — M542 Cervicalgia: Secondary | ICD-10-CM

## 2011-11-21 DIAGNOSIS — F410 Panic disorder [episodic paroxysmal anxiety] without agoraphobia: Secondary | ICD-10-CM

## 2011-11-21 DIAGNOSIS — G43909 Migraine, unspecified, not intractable, without status migrainosus: Secondary | ICD-10-CM

## 2011-11-21 DIAGNOSIS — R5381 Other malaise: Secondary | ICD-10-CM

## 2011-11-21 MED ORDER — VENLAFAXINE HCL ER 37.5 MG PO CP24: 37.5000 mg | ORAL_CAPSULE | Freq: Every day | ORAL | Status: DC

## 2011-11-21 MED ORDER — AMOXICILLIN-POT CLAVULANATE 875-125 MG PO TABS: 1.0000 | ORAL_TABLET | Freq: Two times a day (BID) | ORAL | Status: AC

## 2011-11-21 MED ORDER — OXYCODONE-ACETAMINOPHEN 7.5-325 MG PO TABS: 1.0000 | ORAL_TABLET | Freq: Three times a day (TID) | ORAL | Status: DC | PRN

## 2011-11-21 MED ORDER — GUAIFENESIN ER 600 MG PO TB12: 600.0000 mg | ORAL_TABLET | Freq: Two times a day (BID) | ORAL | Status: DC

## 2011-11-21 MED ORDER — VENLAFAXINE HCL ER 75 MG PO CP24: 75.0000 mg | ORAL_CAPSULE | Freq: Every day | ORAL | Status: DC

## 2011-11-21 MED ORDER — CLONAZEPAM 0.5 MG PO TABS: 0.5000 mg | ORAL_TABLET | Freq: Two times a day (BID) | ORAL | Status: DC | PRN

## 2011-11-21 NOTE — Patient Instructions (Signed)

## 2011-11-22 LAB — HEPATIC FUNCTION PANEL
ALT: 11 U/L (ref 0–35)
AST: 13 U/L (ref 0–37)
Bilirubin, Direct: 0.1 mg/dL (ref 0.0–0.3)
Indirect Bilirubin: 0.5 mg/dL (ref 0.0–0.9)
Total Bilirubin: 0.6 mg/dL (ref 0.3–1.2)

## 2011-11-22 LAB — CBC
MCHC: 33.5 g/dL (ref 30.0–36.0)
Platelets: 420 10*3/uL — ABNORMAL HIGH (ref 150–400)
RDW: 13.2 % (ref 11.5–15.5)
WBC: 9.2 10*3/uL (ref 4.0–10.5)

## 2011-11-22 LAB — BASIC METABOLIC PANEL
Chloride: 107 mEq/L (ref 96–112)
Creat: 0.82 mg/dL (ref 0.50–1.10)
Potassium: 4.5 mEq/L (ref 3.5–5.3)

## 2011-11-22 LAB — TSH: TSH: 1.218 u[IU]/mL (ref 0.350–4.500)

## 2011-11-27 ENCOUNTER — Encounter: Payer: Self-pay | Admitting: Family Medicine

## 2011-11-27 DIAGNOSIS — F419 Anxiety disorder, unspecified: Secondary | ICD-10-CM

## 2011-11-27 DIAGNOSIS — F329 Major depressive disorder, single episode, unspecified: Secondary | ICD-10-CM | POA: Insufficient documentation

## 2011-11-27 DIAGNOSIS — F32A Depression, unspecified: Secondary | ICD-10-CM

## 2011-11-27 DIAGNOSIS — F53 Postpartum depression: Secondary | ICD-10-CM

## 2011-11-27 HISTORY — DX: Anxiety disorder, unspecified: F41.9

## 2011-11-27 HISTORY — DX: Depression, unspecified: F32.A

## 2011-11-27 HISTORY — DX: Postpartum depression: F53.0
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
## 2011-11-27 NOTE — Assessment & Plan Note (Signed)
-- 

## 2011-11-27 NOTE — Assessment & Plan Note (Addendum)
Has just delivered her 3rd child and is struggling with increased anxiety. Is allowed a small amount of Klonopin to use prn but does also agree to start some Venlafaxine to help her manage the chronic pain of her neck injury and her postpartum depression she has developed. Reassess in 3-4 weeks or as needed.

## 2011-11-27 NOTE — Assessment & Plan Note (Signed)
Patient is once again encouraged to quit completely.

## 2011-11-27 NOTE — Assessment & Plan Note (Signed)
She is tearful and agrees to start Venlafaxine today
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
## 2011-11-27 NOTE — Assessment & Plan Note (Signed)
Antibiotics are started today, increase rest and fluids

## 2011-12-13 ENCOUNTER — Encounter: Payer: Self-pay | Admitting: Family Medicine

## 2011-12-13 ENCOUNTER — Ambulatory Visit (INDEPENDENT_AMBULATORY_CARE_PROVIDER_SITE_OTHER): Payer: Medicaid Other | Admitting: Family Medicine

## 2011-12-13 DIAGNOSIS — IMO0002 Reserved for concepts with insufficient information to code with codable children: Secondary | ICD-10-CM

## 2011-12-13 DIAGNOSIS — L0291 Cutaneous abscess, unspecified: Secondary | ICD-10-CM

## 2011-12-13 DIAGNOSIS — M542 Cervicalgia: Secondary | ICD-10-CM

## 2011-12-13 DIAGNOSIS — F329 Major depressive disorder, single episode, unspecified: Secondary | ICD-10-CM

## 2011-12-13 DIAGNOSIS — L0390 Cellulitis, unspecified: Secondary | ICD-10-CM

## 2011-12-13 DIAGNOSIS — G43909 Migraine, unspecified, not intractable, without status migrainosus: Secondary | ICD-10-CM

## 2011-12-13 DIAGNOSIS — R319 Hematuria, unspecified: Secondary | ICD-10-CM

## 2011-12-13 DIAGNOSIS — G8929 Other chronic pain: Secondary | ICD-10-CM

## 2011-12-13 DIAGNOSIS — F53 Postpartum depression: Secondary | ICD-10-CM

## 2011-12-13 DIAGNOSIS — F341 Dysthymic disorder: Secondary | ICD-10-CM

## 2011-12-13 DIAGNOSIS — R109 Unspecified abdominal pain: Secondary | ICD-10-CM

## 2011-12-13 DIAGNOSIS — S129XXA Fracture of neck, unspecified, initial encounter: Secondary | ICD-10-CM

## 2011-12-13 LAB — POCT URINALYSIS DIPSTICK
Glucose, UA: NEGATIVE
Leukocytes, UA: NEGATIVE
Nitrite, UA: NEGATIVE
Urobilinogen, UA: 0.2

## 2011-12-13 MED ORDER — OXYCODONE-ACETAMINOPHEN 7.5-325 MG PO TABS: 1.0000 | ORAL_TABLET | Freq: Four times a day (QID) | ORAL | Status: DC | PRN

## 2011-12-13 MED ORDER — VENLAFAXINE HCL ER 37.5 MG PO CP24: 37.5000 mg | ORAL_CAPSULE | Freq: Every day | ORAL | Status: DC

## 2011-12-13 MED ORDER — CEPHALEXIN 500 MG PO CAPS: 500.0000 mg | ORAL_CAPSULE | Freq: Four times a day (QID) | ORAL | Status: DC

## 2011-12-13 NOTE — Patient Instructions (Signed)
Cellulitis Cellulitis is an infection of the skin and the tissue beneath it. The area is typically red and tender. It is caused by germs (bacteria) (usually staph or strep) that enter the body through cuts or sores. Cellulitis most commonly occurs in the arms or lower legs.  HOME CARE INSTRUCTIONS   If you are given a prescription for medications which kill germs (antibiotics), take as directed until finished.   If the infection is on the arm or leg, keep the limb elevated as able.   Use a warm cloth several times per day to relieve pain and encourage healing.   See your caregiver for recheck of the infected site as directed if problems arise.   Only take over-the-counter or prescription medicines for pain, discomfort, or fever as directed by your caregiver.  SEEK MEDICAL CARE IF:   The area of redness (inflammation) is spreading, there are red streaks coming from the infected site, or if a part of the infection begins to turn dark in color.   The joint or bone underneath the infected skin becomes painful after the skin has healed.   The infection returns in the same or another area after it seems to have gone away.   A boil or bump swells up. This may be an abscess.   New, unexplained problems such as pain or fever develop.  SEEK IMMEDIATE MEDICAL CARE IF:   You have a fever.   You or your child feels drowsy or lethargic.   There is vomiting, diarrhea, or lasting discomfort or feeling ill (malaise) with muscle aches and pains.  MAKE SURE YOU:   Understand these instructions.   Will watch your condition.   Will get help right away if you are not doing well or get worse.  Document Released: 07/20/2005 Document Revised: 06/22/2011 Document Reviewed: 05/28/2008 [facility] Patient Information 2012 [place], [place].  Warm compress twice daily, clean with hydrogen peroxide and apply antibiotic ointment twice daily as well  Start a probiotic cap such as  Align or Culturelle daily and  consider Dan Active yogurt drink daily as well

## 2011-12-15 DIAGNOSIS — L0390 Cellulitis, unspecified: Secondary | ICD-10-CM | POA: Insufficient documentation

## 2011-12-15 NOTE — Assessment & Plan Note (Signed)
No recent flare.  

## 2011-12-15 NOTE — Assessment & Plan Note (Signed)
Started on Keflex and encouraged daily cleansing with warm water and peroxide. Keep it elevated

## 2011-12-15 NOTE — Assessment & Plan Note (Signed)
Has app[facility] later [facility] this month [facility] to see about further revision or just pain management

## 2011-12-15 NOTE — Progress Notes (Signed)
Patient ID: [name] [name], female   DOB: November 16, 1980, 31 y.o.   MRN: [id number] [name] [name] [id number] [birth year] 12/15/2011      Progress Note-Follow Up  Subjective  Chief Complaint  Chief Complaint  Patient presents with  . Follow-up    HPI  Patient is a 31 year old Caucasian female who is here for followup. The venlafaxine 37.5 mg and she stopped it she had a negative I. bronchitis and got significantly better but over the last few days she notes increased nasal congestion and malaise. She denies fevers chills, sore throat, chest pain, palpitations, GI or GU complaints. She did recently cleaning her bathroom with ammonia and bleach combination and has been more short of breath since then  Past Medical History  Diagnosis Date  . Mental disorder   . Headache   . Preterm labor   . Fracture of spinal vertebra 2009    Surgery in 2011  . Seizures     Last seizure 2005  . Hemorrhoids thrombosed   . Anxiety   . Postpartum depression 11/27/2011    Past Surgical History  Procedure Date  . Cervical fusion 05/26/2011    She thinks it was C2    Family History  Problem Relation Age of Onset  . Arthritis Mother   . Coronary artery disease Father     s/p MIs/ smoker  . Alcohol abuse Father   . Heart attack Father   . Endometriosis Sister   . Aplastic anemia Maternal Grandmother   . Arthritis Maternal Grandmother     s/p hip replacement for arthritis  . Anemia Maternal Grandmother     aplastic  . Scoliosis Maternal Grandfather     s/p surgeries  . Endometriosis Sister   . Cancer Sister     cervical  . Otitis media Daughter     History   Social History  . Marital Status: Single    Spouse Name: N/A    Number of Children: N/A  . Years of Education: N/A   Occupational History  . Not on file.   Social History Main Topics  . Smoking status: Current Everyday Smoker -- 0.5 packs/day for 17 years    Types: Cigarettes  . Smokeless tobacco: Never Used  . Alcohol  Use: No  . Drug Use: No  . Sexually Active: Yes     tubal   Other Topics Concern  . Not on file   Social History Narrative  . No narrative on file    Current Outpatient Prescriptions on File Prior to Visit  Medication Sig Dispense Refill  . acetaminophen (TYLENOL) 500 MG tablet Take 500-1,000 mg by mouth daily as needed. For pain       . clonazePAM (KLONOPIN) 0.5 MG tablet Take 1 tablet (0.5 mg total) by mouth 2 (two) times daily as needed. For anxiety/ panic disorder  30 tablet  0  . guaiFENesin (MUCINEX) 600 MG 12 hr tablet Take 1 tablet (600 mg total) by mouth 2 (two) times daily.  20 tablet  0  . hydrocortisone (ANUSOL-HC) 25 MG suppository Place 25 mg rectally 2 (two) times daily.      Marland Kitchen lidocaine-hydrocortisone (ANAMANTEL HC) 3-0.5 % CREA Place 1 Applicatorful rectally daily as needed. For Hemorids.        No Known Allergies  Review of Systems  Review of Systems  Constitutional: Negative for fever and malaise/fatigue.  HENT: Positive for congestion and neck pain.   Eyes: Negative for discharge.  Respiratory: Positive for cough.  Negative for shortness of breath.   Cardiovascular: Negative for chest pain, palpitations and leg swelling.  Gastrointestinal: Negative for nausea, abdominal pain and diarrhea.  Genitourinary: Negative for dysuria.  Musculoskeletal: Negative for falls.  Skin: Positive for rash.  Neurological: Negative for loss of consciousness and headaches.  Endo/Heme/Allergies: Negative for polydipsia.  Psychiatric/Behavioral: Negative for depression and suicidal ideas. The patient is not nervous/anxious and does not have insomnia.     Objective  BP 132/86  Pulse 98  Temp(Src) 100.2 F (37.9 C) (Temporal)  Ht 5\' 6"  (1.676 m)  Wt 129 lb 6.4 oz (58.695 kg)  BMI 20.89 kg/m2  SpO2 98%  LMP 11/25/2011  Physical Exam  Physical Exam  Constitutional: She is oriented to person, place, and time and well-developed, well-nourished, and in no distress. No  distress.  HENT:  Head: Normocephalic and atraumatic.  Eyes: Conjunctivae are normal.  Neck: Neck supple. No thyromegaly present.  Cardiovascular: Normal rate, regular rhythm and normal heart sounds.   No murmur heard. Pulmonary/Chest: Effort normal and breath sounds normal. She has no wheezes.  Abdominal: She exhibits no distension and no mass.  Musculoskeletal: She exhibits no edema.  Lymphadenopathy:    She has no cervical adenopathy.  Neurological: She is alert and oriented to person, place, and time.  Skin: Skin is warm and dry. No rash noted. She is not diaphoretic.  Psychiatric: Memory, affect and judgment normal.    Lab Results  Component Value Date   TSH 1.218 11/21/2011   Lab Results  Component Value Date   WBC 9.2 11/21/2011   HGB 14.0 11/21/2011   HCT 41.8 11/21/2011   MCV 97.9 11/21/2011   PLT 420* 11/21/2011   Lab Results  Component Value Date   CREATININE 0.82 11/21/2011   BUN 17 11/21/2011   NA 140 11/21/2011   K 4.5 11/21/2011   CL 107 11/21/2011   CO2 23 11/21/2011   Lab Results  Component Value Date   ALT 11 11/21/2011   AST 13 11/21/2011   ALKPHOS 118* 11/21/2011   BILITOT 0.6 11/21/2011   Lab Results  Component Value Date   CHOL 140 09/13/2010   Lab Results  Component Value Date   HDL 46.10 09/13/2010   Lab Results  Component Value Date   LDLCALC 88 09/13/2010   Lab Results  Component Value Date   TRIG 29.0 09/13/2010   Lab Results  Component Value Date   CHOLHDL 3 09/13/2010     Assessment & Plan  Postpartum depression The Venlafaxine made her drowsy even at the 37.5 mg tab so she stopped, she is encouraged to switch it to bed time and to try restarting it at qod dosing and slowly titrating up  MIGRAINE HEADACHE No recent flare  Cellulitis Started on Keflex and encouraged daily cleansing with warm water and peroxide. Keep it elevated  VERTEBRAL FRACTURE, CERVICAL SPINE Has [facility] later [facility] this [facility] to see about  further revision or just pain management

## 2011-12-15 NOTE — Assessment & Plan Note (Signed)
The Venlafaxine made her drowsy even at the 37.5 mg tab so she stopped, she is encouraged to switch it to bed time and to try restarting it at qod dosing and slowly titrating up

## 2011-12-19 ENCOUNTER — Telehealth: Payer: Self-pay

## 2011-12-19 MED ORDER — SULFAMETHOXAZOLE-TMP DS 800-160 MG PO TABS: 1.0000 | ORAL_TABLET | Freq: Two times a day (BID) | ORAL | Status: DC

## 2011-12-19 NOTE — Telephone Encounter (Signed)
Patient would like urine culture results and wants to make sure the Bactrim is going to treat the sinus infection, UTI, and infection on her neck? Please advise?

## 2011-12-19 NOTE — Telephone Encounter (Signed)
OK to switch to Bactrim DS 1 tab po bid x 7days and start a probiotic

## 2011-12-19 NOTE — Telephone Encounter (Signed)
Pt called stating that the Keflex is making her have nausea, vomitting, and diarrhea? Pt would like to know if there is anything else she should take? Please advise? Pt callback number [phone]

## 2011-12-19 NOTE — Telephone Encounter (Signed)
Sent RX and informed patient

## 2011-12-19 NOTE — Telephone Encounter (Signed)
Yes it will treat all 3

## 2011-12-20 NOTE — Telephone Encounter (Signed)
Left a message for patient to return my call. 

## 2011-12-20 NOTE — Telephone Encounter (Signed)
Patient informed. 

## 2011-12-20 NOTE — Telephone Encounter (Signed)
As far as I can tell no urine culture was done, her UA did not quite warrant it and she was being treated anyway, if she is having worse symptoms she can come give [place] another sample

## 2011-12-20 NOTE — Telephone Encounter (Signed)
Do you have the results on the urine culture?

## 2011-12-21 ENCOUNTER — Other Ambulatory Visit: Payer: Self-pay

## 2011-12-21 DIAGNOSIS — F419 Anxiety disorder, unspecified: Secondary | ICD-10-CM

## 2011-12-21 MED ORDER — CLONAZEPAM 0.5 MG PO TABS: 0.5000 mg | ORAL_TABLET | Freq: Two times a day (BID) | ORAL | Status: DC | PRN

## 2011-12-21 NOTE — Telephone Encounter (Signed)
Pt will be completely out of Klonopin tomorrow. Please send to [facility] in [place]. Pt would like to be notified if sent to pharmacy. Please advise?

## 2011-12-21 NOTE — Telephone Encounter (Signed)
[name] [name] can have a refill on her Klonopin 0.5 mg tab po daily prn, disp # 30 1 rf

## 2011-12-21 NOTE — Telephone Encounter (Signed)
RX sent to pharmacy  

## 2012-01-09 ENCOUNTER — Encounter: Payer: Self-pay | Admitting: Family Medicine

## 2012-01-09 ENCOUNTER — Ambulatory Visit (INDEPENDENT_AMBULATORY_CARE_PROVIDER_SITE_OTHER): Payer: Medicaid Other | Admitting: Family Medicine

## 2012-01-09 VITALS — BP 110/73 | HR 109 | Temp 101.0°F | Ht 66.0 in | Wt 127.8 lb

## 2012-01-09 DIAGNOSIS — R569 Unspecified convulsions: Secondary | ICD-10-CM

## 2012-01-09 DIAGNOSIS — J029 Acute pharyngitis, unspecified: Secondary | ICD-10-CM

## 2012-01-09 DIAGNOSIS — F53 Postpartum depression: Secondary | ICD-10-CM

## 2012-01-09 DIAGNOSIS — M542 Cervicalgia: Secondary | ICD-10-CM

## 2012-01-09 DIAGNOSIS — J40 Bronchitis, not specified as acute or chronic: Secondary | ICD-10-CM

## 2012-01-09 DIAGNOSIS — G8929 Other chronic pain: Secondary | ICD-10-CM

## 2012-01-09 DIAGNOSIS — F329 Major depressive disorder, single episode, unspecified: Secondary | ICD-10-CM

## 2012-01-09 DIAGNOSIS — F341 Dysthymic disorder: Secondary | ICD-10-CM

## 2012-01-09 DIAGNOSIS — M479 Spondylosis, unspecified: Secondary | ICD-10-CM

## 2012-01-09 DIAGNOSIS — IMO0002 Reserved for concepts with insufficient information to code with codable children: Secondary | ICD-10-CM

## 2012-01-09 MED ORDER — OXYCODONE-ACETAMINOPHEN 7.5-325 MG PO TABS: 1.0000 | ORAL_TABLET | Freq: Four times a day (QID) | ORAL | Status: DC | PRN

## 2012-01-09 MED ORDER — METHYLPREDNISOLONE 4 MG PO KIT: PACK | ORAL | Status: AC

## 2012-01-09 MED ORDER — CLONAZEPAM 0.5 MG PO TABS: 0.5000 mg | ORAL_TABLET | Freq: Two times a day (BID) | ORAL | Status: DC | PRN

## 2012-01-09 NOTE — Patient Instructions (Signed)
Norovirus Infection Norovirus illness is caused by a viral infection. The term norovirus refers to a group of viruses. Any of those viruses can cause norovirus illness. This illness is often referred to by other names such as viral gastroenteritis, stomach flu, and food poisoning. Anyone can get a norovirus infection. People can have the illness multiple times during their lifetime. CAUSES  Norovirus is found in the stool or vomit of infected people. It is easily spread from person to person (contagious). People with norovirus are contagious from the moment they begin feeling ill. They may remain contagious for as long as 3 days to 2 weeks after recovery. People can become infected with the virus in several ways. This includes:  Eating food or drinking liquids that are contaminated with norovirus.   Touching surfaces or objects contaminated with norovirus, and then placing your hand in your mouth.   Having direct contact with a person who is infected and shows symptoms. This may occur while caring for someone with illness or while sharing foods or eating utensils with someone who is ill.  SYMPTOMS  Symptoms usually begin 1 to 2 days after ingestion of the virus. Symptoms may include:  Nausea.   Vomiting.   Diarrhea.   Stomach cramps.   Low-grade fever.   Chills.   Headache.   Muscle aches.   Tiredness.  Most people with norovirus illness get better within 1 to 2 days. Some people become dehydrated because they cannot drink enough liquids to replace those lost from vomiting and diarrhea. This is especially true for young children, the elderly, and others who are unable to care for themselves. DIAGNOSIS  Diagnosis is based on your symptoms and exam. Currently, only state public health laboratories have the ability to test for norovirus in stool or vomit. TREATMENT  No specific treatment exists for norovirus infections. No vaccine is available to prevent infections. Norovirus illness  is usually brief in healthy people. If you are ill with vomiting and diarrhea, you should drink enough water and fluids to keep your urine clear or pale yellow. Dehydration is the most serious health effect that can result from this infection. By drinking oral rehydration solution (ORS), people can reduce their chance of becoming dehydrated. There are many commercially available pre-made and powdered ORS designed to safely rehydrate people. These may be recommended by your caregiver. Replace any new fluid losses from diarrhea or vomiting with ORS as follows:  If your child weighs 10 kg or less (22 lb or less), give 60 to 120 ml ( to  cup or 2 to 4 oz) of ORS for each diarrheal stool or vomiting episode.   If your child weighs more than 10 kg (more than 22 lb), give 120 to 240 ml ( to 1 cup or 4 to 8 oz) of ORS for each diarrheal stool or vomiting episode.  HOME CARE INSTRUCTIONS   Follow all your caregiver's instructions.   Avoid sugar-free and alcoholic drinks while ill.   Only take over-the-counter or prescription medicines for pain, vomiting, diarrhea, or fever as directed by your caregiver.  You can decrease your chances of coming in contact with norovirus or spreading it by following these steps:  Frequently wash your hands, especially after using the toilet, changing diapers, and before eating or preparing food.   Carefully wash fruits and vegetables. Cook shellfish before eating them.   Do not prepare food for others while you are infected and for at least 3 days after recovering from illness.     Thoroughly clean and disinfect contaminated surfaces immediately after an episode of illness using a bleach-based household cleaner.   Immediately remove and wash clothing or linens that may be contaminated with the virus.   Use the toilet to dispose of any vomit or stool. Make sure the surrounding area is kept clean.   Food that may have been contaminated by an ill person should be  discarded.  SEEK IMMEDIATE MEDICAL CARE IF:   You develop symptoms of dehydration that do not improve with fluid replacement. This may include:   Excessive sleepiness.   Lack of tears.   Dry mouth.   Dizziness when standing.   Weak pulse.  Document Released: 12/31/2002 Document Revised: 09/29/2011 Document Reviewed: 02/01/2010 ExitCare Patient Information 2012 [place], [facility]  Push clear fluids, may continue to use Ondansetron or Promethazine as needed, maintain clear fluids with ginger ale and gatorade for the rest of today and then can advance diet with BRAT (bananas, rice, applesauce, toast

## 2012-01-12 NOTE — Assessment & Plan Note (Signed)
Did not like the very vivid dreams she had on the Venlafaxine she would like to stop it we allow this for now and continue her Clonazepam for now. Will need referral if she continues to ask to increase her Clonazepam

## 2012-01-12 NOTE — Assessment & Plan Note (Signed)
resolved 

## 2012-01-12 NOTE — Progress Notes (Signed)
Patient ID: [name] [name], female   DOB: 1981/01/05, 31 y.o.   MRN: [id number] [name] [id number] 14-Feb-1981 01/12/2012      Progress Note-Follow Up  Subjective  Chief Complaint  Chief Complaint  Patient presents with  . Follow-up    HPI  Patient is a 23 Caucasian female who is in today for reevaluation. She was able to tolerate her Effexor every other day dosing but did have a distant history of an increased anxiety so she would like to stop it. She's not had any seizure activity but are clear he is back with her anxiety and fatigue as when her seizures occur she denies any pharyngitis or acute illness at this time but her daughter has been home for nausea vomiting diarrhea illness she denies any diarrhea, nausea vomiting personally.  Past Medical History  Diagnosis Date  . Mental disorder   . Headache   . Preterm labor   . Fracture of spinal vertebra 2009    Surgery in 2011  . Seizures     Last seizure 2005  . Hemorrhoids thrombosed   . Anxiety   . Postpartum depression 11/27/2011    Past Surgical History  Procedure Date  . Cervical fusion 05/26/2011    She thinks it was C2    Family History  Problem Relation Age of Onset  . Arthritis Mother   . Coronary artery disease Father     s/p MIs/ smoker  . Alcohol abuse Father   . Heart attack Father   . Endometriosis Sister   . Aplastic anemia Maternal Grandmother   . Arthritis Maternal Grandmother     s/p hip replacement for arthritis  . Anemia Maternal Grandmother     aplastic  . Scoliosis Maternal Grandfather     s/p surgeries  . Endometriosis Sister   . Cancer Sister     cervical  . Otitis media Daughter     History   Social History  . Marital Status: Single    Spouse Name: N/A    Number of Children: N/A  . Years of Education: N/A   Occupational History  . Not on file.   Social History Main Topics  . Smoking status: Current Everyday Smoker -- 0.5 packs/day for 17 years    Types:  Cigarettes  . Smokeless tobacco: Never Used  . Alcohol Use: No  . Drug Use: No  . Sexually Active: Yes     tubal   Other Topics Concern  . Not on file   Social History Narrative  . No narrative on file    Current Outpatient Prescriptions on File Prior to Visit  Medication Sig Dispense Refill  . acetaminophen (TYLENOL) 500 MG tablet Take 500-1,000 mg by mouth daily as needed. For pain       . guaiFENesin (MUCINEX) 600 MG 12 hr tablet Take 1 tablet (600 mg total) by mouth 2 (two) times daily.  20 tablet  0    Allergies  Allergen Reactions  . Keflex     Diarrhea, nausea, vomitting    Review of Systems  Review of Systems  Constitutional: Negative for fever and malaise/fatigue.  HENT: Negative for congestion.   Eyes: Negative for pain and discharge.  Respiratory: Negative for shortness of breath.   Cardiovascular: Negative for chest pain, palpitations and leg swelling.  Gastrointestinal: Negative for nausea, abdominal pain and diarrhea.  Genitourinary: Negative for dysuria.  Musculoskeletal: Negative for falls.  Skin: Negative for rash.  Neurological: Negative for loss of  consciousness and headaches.  Endo/Heme/Allergies: Negative for polydipsia.  Psychiatric/Behavioral: Positive for depression. Negative for suicidal ideas. The patient is nervous/anxious and has insomnia.     Objective  BP 110/73  Pulse 109  Temp(Src) 101 F (38.3 C) (Temporal)  Ht 5\' 6"  (1.676 m)  Wt 127 lb 12.8 oz (57.97 kg)  BMI 20.63 kg/m2  SpO2 99%  LMP 11/25/2011  Physical Exam  Physical Exam  Constitutional: She is oriented to person, place, and time and well-developed, well-nourished, and in no distress. No distress.  HENT:  Head: Normocephalic and atraumatic.  Left Ear: External ear normal.  Eyes: Conjunctivae are normal.  Neck: Neck supple. No thyromegaly present.  Cardiovascular: Normal rate, regular rhythm and normal heart sounds.   No murmur heard. Pulmonary/Chest: Effort  normal and breath sounds normal. She has no wheezes.  Abdominal: She exhibits no distension and no mass.  Musculoskeletal: She exhibits no edema.  Lymphadenopathy:    She has no cervical adenopathy.  Neurological: She is alert and oriented to person, place, and time.  Skin: Skin is warm and dry. No rash noted. She is not diaphoretic.  Psychiatric: Memory, affect and judgment normal.    Lab Results  Component Value Date   TSH 1.218 11/21/2011   Lab Results  Component Value Date   WBC 9.2 11/21/2011   HGB 14.0 11/21/2011   HCT 41.8 11/21/2011   MCV 97.9 11/21/2011   PLT 420* 11/21/2011   Lab Results  Component Value Date   CREATININE 0.82 11/21/2011   BUN 17 11/21/2011   NA 140 11/21/2011   K 4.5 11/21/2011   CL 107 11/21/2011   CO2 23 11/21/2011   Lab Results  Component Value Date   ALT 11 11/21/2011   AST 13 11/21/2011   ALKPHOS 118* 11/21/2011   BILITOT 0.6 11/21/2011   Lab Results  Component Value Date   CHOL 140 09/13/2010   Lab Results  Component Value Date   HDL 46.10 09/13/2010   Lab Results  Component Value Date   LDLCALC 88 09/13/2010   Lab Results  Component Value Date   TRIG 29.0 09/13/2010   Lab Results  Component Value Date   CHOLHDL 3 09/13/2010     Assessment & Plan  Postpartum depression Did not like the very vivid dreams she had on the Venlafaxine she would like to stop it we allow this for now and continue her Clonazepam for now. Will need referral if she continues to ask to increase her Clonazepam  PHARYNGITIS resolved  SEIZURE DISORDER She reports these occur with increased fatigue and anxiety but have not occurred recently she is beginning to sleep better  ARTHRITIS, CERVICAL SPINE Has not had appt with neurosurg yet. Agrees she will have it soon

## 2012-01-12 NOTE — Assessment & Plan Note (Signed)
Has not had appt with neurosurg yet. Agrees she will have it soon

## 2012-01-12 NOTE — Assessment & Plan Note (Signed)
She reports these occur with increased fatigue and anxiety but have not occurred recently she is beginning to sleep better

## 2012-01-16 ENCOUNTER — Telehealth: Payer: Self-pay | Admitting: Family Medicine

## 2012-01-17 MED ORDER — AZITHROMYCIN 250 MG PO TABS: 250.0000 mg | ORAL_TABLET | ORAL | Status: DC

## 2012-01-17 NOTE — Telephone Encounter (Signed)
RX sent and patient informed and states understandment

## 2012-01-17 NOTE — Telephone Encounter (Signed)
Pt called and stated that she feels like the infection is out of her lungs but she still has thick- green mucus and her throat hurts really bad? Pt would like an antibiotic called in or a refill of the last one called into the pharmacy? Please advise?

## 2012-01-17 NOTE — Telephone Encounter (Signed)
Patient can have a Zpak but if no improvement needs to come in for evaluation. Azithromycin 250 mg tab 2 tabs po once and then 1 tab po daily x 4 d, disp #6

## 2012-01-18 ENCOUNTER — Encounter: Payer: Self-pay | Admitting: Family Medicine

## 2012-01-18 ENCOUNTER — Ambulatory Visit (INDEPENDENT_AMBULATORY_CARE_PROVIDER_SITE_OTHER): Payer: Medicaid Other | Admitting: Family Medicine

## 2012-01-18 ENCOUNTER — Telehealth: Payer: Self-pay

## 2012-01-18 VITALS — BP 117/78 | HR 97 | Temp 98.6°F | Ht 66.0 in | Wt 127.8 lb

## 2012-01-18 DIAGNOSIS — J029 Acute pharyngitis, unspecified: Secondary | ICD-10-CM

## 2012-01-18 DIAGNOSIS — F17200 Nicotine dependence, unspecified, uncomplicated: Secondary | ICD-10-CM

## 2012-01-18 LAB — POCT RAPID STREP A (OFFICE): Rapid Strep A Screen: NEGATIVE

## 2012-01-18 MED ORDER — METHYLPREDNISOLONE 4 MG PO KIT: PACK | ORAL | Status: DC

## 2012-01-18 MED ORDER — ACETAMINOPHEN-CODEINE 120-12 MG/5ML PO SUSP: 5.0000 mL | Freq: Four times a day (QID) | ORAL | Status: AC | PRN

## 2012-01-18 MED ORDER — SUCRALFATE 1 GM/10ML PO SUSP: 1.0000 g | Freq: Four times a day (QID) | ORAL | Status: DC

## 2012-01-18 NOTE — Telephone Encounter (Signed)
Pt scheduled appt for 9 this morning

## 2012-01-18 NOTE — Progress Notes (Signed)
Patient ID: Lia Hopping, female   DOB: 1981/07/09, 31 y.o.   MRN: [id number] LUCELY LEARD [id number] 1980-12-20 01/18/2012      Progress Note-Follow Up  Subjective  Chief Complaint  Chief Complaint  Patient presents with  . Sore Throat    possible strep    HPI  Patient is a 31 year old Caucasian female who is in today for evaluation of worsening sore throat. When last seen she had a lot of chest congestion and bronchitis we'll sinus pressure and pain. She reports that the treatment of chest congestion improved sinus pressure persists. Over the last 2 days she had increased green rhinorrhea and cough productive of thick sputum. She is no pain in temp to a max 101. His abdominal discomfort mild and diffuse. She has persistent nausea anorexia and some gagging at times. Also notes an episode of fevers and chills. Her episode of Arna Medici virus last week with a one-day history of diarrhea nausea vomiting those symptoms subsided. He did take some Tylenol about 500 mg about 7:30 am before coming in.  Past Medical History  Diagnosis Date  . Mental disorder   . Headache   . Preterm labor   . Fracture of spinal vertebra 2009    Surgery in 2011  . Seizures     Last seizure 2005  . Hemorrhoids thrombosed   . Anxiety   . Postpartum depression 11/27/2011    Past Surgical History  Procedure Date  . Cervical fusion 05/26/2011    She thinks it was C2    Family History  Problem Relation Age of Onset  . Arthritis Mother   . Coronary artery disease Father     s/p MIs/ smoker  . Alcohol abuse Father   . Heart attack Father   . Endometriosis Sister   . Aplastic anemia Maternal Grandmother   . Arthritis Maternal Grandmother     s/p hip replacement for arthritis  . Anemia Maternal Grandmother     aplastic  . Scoliosis Maternal Grandfather     s/p surgeries  . Endometriosis Sister   . Cancer Sister     cervical  . Otitis media Daughter     History   Social History  . Marital  Status: Single    Spouse Name: N/A    Number of Children: N/A  . Years of Education: N/A   Occupational History  . Not on file.   Social History Main Topics  . Smoking status: Current Everyday Smoker -- 0.5 packs/day for 17 years    Types: Cigarettes  . Smokeless tobacco: Never Used  . Alcohol Use: No  . Drug Use: No  . Sexually Active: Yes     tubal   Other Topics Concern  . Not on file   Social History Narrative  . No narrative on file    Current Outpatient Prescriptions on File Prior to Visit  Medication Sig Dispense Refill  . acetaminophen (TYLENOL) 500 MG tablet Take 500-1,000 mg by mouth daily as needed. For pain       . azithromycin (ZITHROMAX) 250 MG tablet Take 1 tablet (250 mg total) by mouth as directed.  6 each  0  . clonazePAM (KLONOPIN) 0.5 MG tablet Take 1 tablet (0.5 mg total) by mouth 2 (two) times daily as needed. For anxiety/ panic disorder  60 tablet  1  . oxyCODONE-acetaminophen (PERCOCET) 7.5-325 MG per tablet Take 1 tablet by mouth every 6 (six) hours as needed.  120 tablet  0  .  guaiFENesin (MUCINEX) 600 MG 12 hr tablet Take 1 tablet (600 mg total) by mouth 2 (two) times daily.  20 tablet  0    Allergies  Allergen Reactions  . Keflex     Diarrhea, nausea, vomitting    Review of Systems  Review of Systems  Constitutional: Positive for fever, chills and malaise/fatigue.  HENT: Positive for congestion and sore throat.   Eyes: Negative for discharge.  Respiratory: Positive for cough and sputum production. Negative for shortness of breath.   Cardiovascular: Negative for chest pain, palpitations and leg swelling.  Gastrointestinal: Positive for nausea and abdominal pain. Negative for diarrhea.       Diffuse, mild abdominal discomfort, some gagging but no active vomiting for over a week  Genitourinary: Negative for dysuria.  Musculoskeletal: Negative for falls.  Skin: Negative for rash.  Neurological: Negative for loss of consciousness and  headaches.  Endo/Heme/Allergies: Negative for polydipsia.  Psychiatric/Behavioral: Negative for depression and suicidal ideas. The patient is not nervous/anxious and does not have insomnia.     Objective  BP 117/78  Pulse 97  Temp(Src) 98.6 F (37 C) (Temporal)  Ht 5\' 6"  (1.676 m)  Wt 127 lb 12.8 oz (57.97 kg)  BMI 20.63 kg/m2  SpO2 100%  Physical Exam  Physical Exam  Constitutional: She is oriented to person, place, and time and well-developed, well-nourished, and in no distress. No distress.  HENT:  Head: Normocephalic and atraumatic.  Right Ear: External ear normal.  Left Ear: External ear normal.       Oropharynx erythematous and edematous  Eyes: Conjunctivae are normal.  Neck: Neck supple. No thyromegaly present.  Cardiovascular: Normal rate, regular rhythm and normal heart sounds.   No murmur heard. Pulmonary/Chest: Effort normal and breath sounds normal. She has no wheezes.  Abdominal: She exhibits no distension and no mass.  Musculoskeletal: She exhibits no edema.  Lymphadenopathy:    She has cervical adenopathy.  Neurological: She is alert and oriented to person, place, and time.  Skin: Skin is warm and dry. No rash noted. She is not diaphoretic.  Psychiatric: Memory, affect and judgment normal.    Lab Results  Component Value Date   TSH 1.218 11/21/2011   Lab Results  Component Value Date   WBC 9.2 11/21/2011   HGB 14.0 11/21/2011   HCT 41.8 11/21/2011   MCV 97.9 11/21/2011   PLT 420* 11/21/2011   Lab Results  Component Value Date   CREATININE 0.82 11/21/2011   BUN 17 11/21/2011   NA 140 11/21/2011   K 4.5 11/21/2011   CL 107 11/21/2011   CO2 23 11/21/2011   Lab Results  Component Value Date   ALT 11 11/21/2011   AST 13 11/21/2011   ALKPHOS 118* 11/21/2011   BILITOT 0.6 11/21/2011   Lab Results  Component Value Date   CHOL 140 09/13/2010   Lab Results  Component Value Date   HDL 46.10 09/13/2010   Lab Results  Component Value Date   LDLCALC 88  09/13/2010   Lab Results  Component Value Date   TRIG 29.0 09/13/2010   Lab Results  Component Value Date   CHOLHDL 3 09/13/2010     Assessment & Plan  PHARYNGITIS She had Norovirus last week with n/v/diarrhea and since then has had progressively painful sore throat to point where she is havign trouble swallowing at this time. Her rapid strep was negative, will send for confirmation. She is instructed to take rest of Azithromycin, push clear fluids and  given Carafate to use QID. Also given Medrol to help with edema in oropharynx and asked to call if symptoms worsen  TOBACCO ABUSE Unfortunately continues to smoke, needs complete cessation and that will help decrease frequency of recurrent illness

## 2012-01-18 NOTE — Assessment & Plan Note (Signed)
She had Norovirus last week with n/v/diarrhea and since then has had progressively painful sore throat to point where she is havign trouble swallowing at this time. Her rapid strep was negative, will send for confirmation. She is instructed to take rest of Azithromycin, push clear fluids and given Carafate to use QID. Also given Medrol to help with edema in oropharynx and asked to call if symptoms worsen

## 2012-01-18 NOTE — Telephone Encounter (Signed)
Patient called stating that her tonsils are so swollen that its hard to swallow her medication? Patient also stated that there is white stuff coming out of her tonsils? I suggested to patient that she is probably going to need to make an appt but patient states she doesn't drive so her ability to make it in for an appt is limited. Patient is going to try to get ahold of her grandparents and call us back.

## 2012-01-18 NOTE — Assessment & Plan Note (Signed)
Unfortunately continues to smoke, needs complete cessation and that will help decrease frequency of recurrent illness

## 2012-01-18 NOTE — Patient Instructions (Signed)

## 2012-01-20 LAB — CULTURE, GROUP A STREP

## 2012-01-23 ENCOUNTER — Telehealth: Payer: Self-pay

## 2012-01-23 NOTE — Telephone Encounter (Signed)
No I think she needs to give it a week off the antibiotics and push fluids, call if no improvement the green can take a while to fully clear up

## 2012-01-23 NOTE — Telephone Encounter (Signed)
Patient informed. 

## 2012-01-23 NOTE — Telephone Encounter (Signed)
Patient called and stated her congestion is getting better but its still green? Patient would like to know if MD wants her to have another round of antibiotics? Please advise?

## 2012-02-03 ENCOUNTER — Other Ambulatory Visit: Payer: Self-pay

## 2012-02-03 DIAGNOSIS — M542 Cervicalgia: Secondary | ICD-10-CM

## 2012-02-03 MED ORDER — OXYCODONE-ACETAMINOPHEN 7.5-325 MG PO TABS: 1.0000 | ORAL_TABLET | Freq: Four times a day (QID) | ORAL | Status: DC | PRN

## 2012-02-03 NOTE — Telephone Encounter (Signed)
Last refill was 01-09-12 for 120 with no refills

## 2012-02-03 NOTE — Telephone Encounter (Signed)
Informed patient that RX will be ready to be picked up on Monday 04-07-12

## 2012-02-27 ENCOUNTER — Telehealth: Payer: Self-pay

## 2012-02-27 ENCOUNTER — Other Ambulatory Visit: Payer: Self-pay

## 2012-02-27 DIAGNOSIS — G8929 Other chronic pain: Secondary | ICD-10-CM

## 2012-02-27 MED ORDER — OXYCODONE-ACETAMINOPHEN 7.5-325 MG PO TABS: 1.0000 | ORAL_TABLET | Freq: Four times a day (QID) | ORAL | Status: DC | PRN

## 2012-02-27 NOTE — Telephone Encounter (Signed)
Patient states she was pulling weeds at her grandmas and has been in pain ever since. Pt states she can't stand any longer than 15 min at a time and can't do any strenuous activities? Specialist on June 3rd to get back scanned. Pt states she has been having to take her pain medication every 4 hours now. Pt would like to have her pain medication filled? Please advise refill?

## 2012-02-27 NOTE — Telephone Encounter (Signed)
Will allow her to increase her pain med strength to 10/326 but with the same sig. She then will need an appt to document the changes and discuss the need for long pain management. Percocet 10/325 mg 1 tab po q 6 hours prn pain, #120. D/C the 7.5 mg/325 tabs

## 2012-02-27 NOTE — Telephone Encounter (Signed)
Patient called to see if she can take the 7.5mg  Percocet qid. The wrong script was printed. Per MD patient can take 1 q 4 hours max of 6 tabs daily. Pt states she will bring the left over medication with her to her appt at the end of the month

## 2012-02-27 NOTE — Telephone Encounter (Signed)
Pt informed and RX put at front desk. Appt scheduled

## 2012-03-09 ENCOUNTER — Ambulatory Visit: Payer: Medicaid Other | Admitting: Family Medicine

## 2012-03-13 ENCOUNTER — Encounter: Payer: Self-pay | Admitting: Family Medicine

## 2012-03-13 ENCOUNTER — Ambulatory Visit (INDEPENDENT_AMBULATORY_CARE_PROVIDER_SITE_OTHER): Payer: Medicaid Other | Admitting: Family Medicine

## 2012-03-13 VITALS — BP 114/76 | HR 103 | Temp 99.0°F | Ht 66.0 in | Wt 122.0 lb

## 2012-03-13 DIAGNOSIS — M479 Spondylosis, unspecified: Secondary | ICD-10-CM

## 2012-03-13 DIAGNOSIS — F341 Dysthymic disorder: Secondary | ICD-10-CM

## 2012-03-13 DIAGNOSIS — R52 Pain, unspecified: Secondary | ICD-10-CM

## 2012-03-13 DIAGNOSIS — F17200 Nicotine dependence, unspecified, uncomplicated: Secondary | ICD-10-CM

## 2012-03-13 DIAGNOSIS — F419 Anxiety disorder, unspecified: Secondary | ICD-10-CM

## 2012-03-13 MED ORDER — OXYCODONE-ACETAMINOPHEN 10-325 MG PO TABS: 1.0000 | ORAL_TABLET | ORAL | Status: DC | PRN

## 2012-03-13 MED ORDER — CLONAZEPAM 0.5 MG PO TABS: 0.5000 mg | ORAL_TABLET | Freq: Two times a day (BID) | ORAL | Status: DC | PRN

## 2012-03-13 NOTE — Patient Instructions (Signed)
Back Pain, Adult Low back pain is very common. About 1 in 5 people have back pain.The cause of low back pain is rarely dangerous. The pain often gets better over time.About half of people with a sudden onset of back pain feel better in just 2 weeks. About 8 in 10 people feel better by 6 weeks.  CAUSES Some common causes of back pain include:  Strain of the muscles or ligaments supporting the spine.   Wear and tear (degeneration) of the spinal discs.   Arthritis.   Direct injury to the back.  DIAGNOSIS Most of the time, the direct cause of low back pain is not known.However, back pain can be treated effectively even when the exact cause of the pain is unknown.Answering your caregiver's questions about your overall health and symptoms is one of the most accurate ways to make sure the cause of your pain is not dangerous. If your caregiver needs more information, he or she may order lab work or imaging tests (X-rays or MRIs).However, even if imaging tests show changes in your back, this usually does not require surgery. HOME CARE INSTRUCTIONS For many people, back pain returns.Since low back pain is rarely dangerous, it is often a condition that people can learn to manageon their own.   Remain active. It is stressful on the back to sit or stand in one place. Do not sit, drive, or stand in one place for more than 30 minutes at a time. Take short walks on level surfaces as soon as pain allows.Try to increase the length of time you walk each day.   Do not stay in bed.Resting more than 1 or 2 days can delay your recovery.   Do not avoid exercise or work.Your body is made to move.It is not dangerous to be active, even though your back may hurt.Your back will likely heal faster if you return to being active before your pain is gone.   Pay attention to your body when you bend and lift. Many people have less discomfortwhen lifting if they bend their knees, keep the load close to their  bodies,and avoid twisting. Often, the most comfortable positions are those that put less stress on your recovering back.   Find a comfortable position to sleep. Use a firm mattress and lie on your side with your knees slightly bent. If you lie on your back, put a pillow under your knees.   Only take over-the-counter or prescription medicines as directed by your caregiver. Over-the-counter medicines to reduce pain and inflammation are often the most helpful.Your caregiver may prescribe muscle relaxant drugs.These medicines help dull your pain so you can more quickly return to your normal activities and healthy exercise.   Put ice on the injured area.   Put ice in a plastic bag.   Place a towel between your skin and the bag.   Leave the ice on for 15 to 20 minutes, 3 to 4 times a day for the first 2 to 3 days. After that, ice and heat may be alternated to reduce pain and spasms.   Ask your caregiver about trying back exercises and gentle massage. This may be of some benefit.   Avoid feeling anxious or stressed.Stress increases muscle tension and can worsen back pain.It is important to recognize when you are anxious or stressed and learn ways to manage it.Exercise is a great option.  SEEK MEDICAL CARE IF:  You have pain that is not relieved with rest or medicine.   You have   pain that does not improve in 1 week.   You have new symptoms.   You are generally not feeling well.  SEEK IMMEDIATE MEDICAL CARE IF:   You have pain that radiates from your back into your legs.   You develop new bowel or bladder control problems.   You have unusual weakness or numbness in your arms or legs.   You develop nausea or vomiting.   You develop abdominal pain.   You feel faint.  Document Released: 10/10/2005 Document Revised: 09/29/2011 Document Reviewed: 02/28/2011 Exit[employer] Patient Information 2012 [employer]. 

## 2012-03-14 NOTE — Progress Notes (Signed)
Patient ID: [name] [name], female   DOB: [birth year], 31 y.o.   MRN: [id number] [name] [id number] [birth year] 03/14/2012      Progress Note-Follow Up  Subjective  Chief Complaint  Chief Complaint  Patient presents with  . Back Pain    follow up    HPI  Patient is a 31 year old Caucasian female who is in today for evaluation of increased back pain. She's been doing a lot more physical work lately. 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 The burning type pain at times and a constant throbbing sensation all the time. No radicular symptoms or incontinence are noted no actual falls or injury. No chest pain, palpitations or shortness of breath.  Past Medical History  Diagnosis Date  . Mental disorder   . Headache   . Preterm labor   . Fracture of spinal vertebra 2009    Surgery in 2011  . Seizures     Last seizure 2005  . Hemorrhoids thrombosed   . Anxiety   . Postpartum depression 11/27/2011    Past Surgical History  Procedure Date  . Cervical fusion 05/26/2011    She thinks it was C2    Family History  Problem Relation Age of Onset  . Arthritis Mother   . Coronary artery disease Father     s/p MIs/ smoker  . Alcohol abuse Father   . Heart attack Father   . Endometriosis Sister   . Aplastic anemia Maternal Grandmother   . Arthritis Maternal Grandmother     s/p hip replacement for arthritis  . Anemia Maternal Grandmother     aplastic  . Scoliosis Maternal Grandfather     s/p surgeries  . Endometriosis Sister   . Cancer Sister     cervical  . Otitis media Daughter     History   Social History  . Marital Status: Single    Spouse Name: N/A    Number of Children: N/A  . Years of Education: N/A   Occupational History  . Not on file.   Social History Main Topics  . Smoking status: Current Everyday Smoker -- 0.5  packs/day for 17 years    Types: Cigarettes  . Smokeless tobacco: Never Used  . Alcohol Use: No  . Drug Use: No  . Sexually Active: Yes     tubal   Other Topics Concern  . Not on file   Social History Narrative  . No narrative on file    Current Outpatient Prescriptions on File Prior to Visit  Medication Sig Dispense Refill  . acetaminophen (TYLENOL) 500 MG tablet Take 500-1,000 mg by mouth daily as needed. For pain       . clonazePAM (KLONOPIN) 0.5 MG tablet Take 1 tablet (0.5 mg total) by mouth 2 (two) times daily as needed. For anxiety/ panic disorder  60 tablet  1  . DISCONTD: venlafaxine (EFFEXOR XR) 37.5 MG 24 hr capsule Take 1 capsule (37.5 mg total) by mouth daily.  30 capsule  1    Allergies  Allergen Reactions  . Cephalexin     Diarrhea, nausea, vomitting    Review of Systems  Review of Systems  Constitutional: Negative for fever and malaise/fatigue.  HENT: Positive for neck pain. Negative for congestion.   Eyes: Negative for discharge.  Respiratory: Negative for shortness of breath.   Cardiovascular:  Negative for chest pain, palpitations and leg swelling.  Gastrointestinal: Negative for nausea, abdominal pain and diarrhea.  Genitourinary: Negative for dysuria.  Musculoskeletal: Positive for back pain. Negative for falls.       Pain is at base of cervical spine and in the upper lumbar region  Skin: Negative for rash.  Neurological: Negative for loss of consciousness and headaches.  Endo/Heme/Allergies: Negative for polydipsia.  Psychiatric/Behavioral: Negative for depression and suicidal ideas. The patient is not nervous/anxious and does not have insomnia.     Objective  BP 114/76  Pulse 103  Temp(Src) 99 F (37.2 C) (Temporal)  Ht 5\' 6"  (1.676 m)  Wt 122 lb (55.339 kg)  BMI 19.69 kg/m2  SpO2 100%  Physical Exam  Physical Exam  Constitutional: She is oriented to person, place, and time and well-developed, well-nourished, and in no distress. No  distress.  HENT:  Head: Normocephalic and atraumatic.  Eyes: Conjunctivae are normal.  Neck: Neck supple. No thyromegaly present.  Cardiovascular: Normal rate, regular rhythm and normal heart sounds.   No murmur heard. Pulmonary/Chest: Effort normal and breath sounds normal. She has no wheezes.  Abdominal: She exhibits no distension and no mass.  Musculoskeletal: She exhibits no edema.  Lymphadenopathy:    She has no cervical adenopathy.  Neurological: She is alert and oriented to person, place, and time.  Skin: Skin is warm and dry. No rash noted. She is not diaphoretic.       Scar over cervical spine c/w previous surgery  Psychiatric: Memory, affect and judgment normal.    Lab Results  Component Value Date   TSH 1.218 11/21/2011   Lab Results  Component Value Date   WBC 9.2 11/21/2011   HGB 14.0 11/21/2011   HCT 41.8 11/21/2011   MCV 97.9 11/21/2011   PLT 420* 11/21/2011   Lab Results  Component Value Date   CREATININE 0.82 11/21/2011   BUN 17 11/21/2011   NA 140 11/21/2011   K 4.5 11/21/2011   CL 107 11/21/2011   CO2 23 11/21/2011   Lab Results  Component Value Date   ALT 11 11/21/2011   AST 13 11/21/2011   ALKPHOS 118* 11/21/2011   BILITOT 0.6 11/21/2011   Lab Results  Component Value Date   CHOL 140 09/13/2010   Lab Results  Component Value Date   HDL 46.10 09/13/2010   Lab Results  Component Value Date   LDLCALC 88 09/13/2010   Lab Results  Component Value Date   TRIG 29.0 09/13/2010   Lab Results  Component Value Date   CHOLHDL 3 09/13/2010     Assessment & Plan  ARTHRITIS, CERVICAL SPINE Has appt wit neurosurgery in about 2 weeks. Has been more physically active caring for her young children and her older grandmother and her back pain has increased. Will allow her to use the highest strength Percocet every four hours with a max of 6 per day.   TOBACCO ABUSE Unfortunately continues to smoke encourged to attempt complete cessation again

## 2012-03-14 NOTE — Assessment & Plan Note (Signed)
Unfortunately continues to smoke encourged to attempt complete cessation again

## 2012-03-14 NOTE — Assessment & Plan Note (Signed)
Has appt wit neurosurgery in about 2 weeks. Has been more physically active caring for her young children and her older grandmother and her back pain has increased. Will allow her to use the highest strength Percocet every four hours with a max of 6 per day.

## 2012-03-16 ENCOUNTER — Ambulatory Visit: Payer: Medicaid Other | Admitting: Family Medicine

## 2012-03-20 ENCOUNTER — Ambulatory Visit: Payer: Medicaid Other | Admitting: Family Medicine

## 2012-03-28 ENCOUNTER — Telehealth: Payer: Self-pay

## 2012-03-28 NOTE — Telephone Encounter (Signed)
If she wants [place] to we can see if another neurosurgeon will see her but I cannot usually get an MRI any way. Even if I could I cannot definitively treat it. She should either keep a few more appts with them and then I suspect they will get her an MRI or else we will try another Neurosurgeon

## 2012-03-28 NOTE — Telephone Encounter (Signed)
Pt would like to have a neurosurgeon in [place]. Pt can't drive back that far. Please advice?

## 2012-03-28 NOTE — Telephone Encounter (Signed)
Pt states that she went to the appt at [facility] (which was near [place]- too far) for someone to take her. They would only do an x-ray, then gave her a diagnostic block injection in both hips. Patient said it hurt so bad and then could hardly walk yesterday. Pt stated she is supposed to go back there again next Mon (04-02-12) and get two more injections and doesn't want to travel that far or get any more injections. Pt would like to know if MD could schedule for a MRI or CT scan? Please advise?

## 2012-04-02 NOTE — Telephone Encounter (Signed)
I have requested a change to a Programmer, applications but have not heard back yet.

## 2012-04-03 NOTE — Telephone Encounter (Signed)
Yes please. Contact [facility] with same referral and see if they will accept it. Do I need to place a whole new referral?

## 2012-04-03 NOTE — Telephone Encounter (Signed)
[facility] (formally [facility]) takes Medicaid patients. Do you want me to contact them to set up an appt? Thanks, Diane

## 2012-04-06 ENCOUNTER — Telehealth: Payer: Self-pay | Admitting: Family Medicine

## 2012-04-09 ENCOUNTER — Other Ambulatory Visit: Payer: Self-pay

## 2012-04-09 DIAGNOSIS — R52 Pain, unspecified: Secondary | ICD-10-CM

## 2012-04-09 MED ORDER — OXYCODONE-ACETAMINOPHEN 10-325 MG PO TABS: 1.0000 | ORAL_TABLET | ORAL | Status: DC | PRN

## 2012-04-09 NOTE — Telephone Encounter (Signed)
Ok to write refill same sig, same number, can have it on Wedsnesday

## 2012-04-09 NOTE — Telephone Encounter (Signed)
RX printed and pt informed that RX can be picked up on 03-11-12 per MD

## 2012-04-09 NOTE — Telephone Encounter (Signed)
Please advise Percocet refill? Last RX wrote on 03-13-12 quantity 180 with 0 refills.

## 2012-04-11 NOTE — Telephone Encounter (Signed)
perfect

## 2012-04-11 NOTE — Telephone Encounter (Signed)
SW patient, she would like to go to [facility]. Faxed referral today.

## 2012-05-07 ENCOUNTER — Other Ambulatory Visit: Payer: Self-pay

## 2012-05-07 DIAGNOSIS — R52 Pain, unspecified: Secondary | ICD-10-CM

## 2012-05-07 MED ORDER — OXYCODONE-ACETAMINOPHEN 10-325 MG PO TABS: 1.0000 | ORAL_TABLET | ORAL | Status: DC | PRN

## 2012-05-07 NOTE — Telephone Encounter (Signed)
Patient informed. RX put in front cabinet

## 2012-05-07 NOTE — Telephone Encounter (Signed)
OK to let her p/u the rx today or tomorrow

## 2012-05-07 NOTE — Telephone Encounter (Signed)
Pt called in stating that she is going to [place] Wed morning and will be out of her Oxy on Wed or Thurs. Pt would like to know if she can pick up an RX tomorrow morning so she can take with her? Last RX wrote on 04-09-12 quantity 180 with 0 refills. Please advise?

## 2012-06-06 ENCOUNTER — Encounter: Payer: Self-pay | Admitting: Family Medicine

## 2012-06-06 ENCOUNTER — Ambulatory Visit (INDEPENDENT_AMBULATORY_CARE_PROVIDER_SITE_OTHER): Payer: Medicaid Other | Admitting: Family Medicine

## 2012-06-06 VITALS — BP 116/77 | HR 81 | Temp 97.4°F | Ht 66.0 in | Wt 124.8 lb

## 2012-06-06 DIAGNOSIS — F341 Dysthymic disorder: Secondary | ICD-10-CM

## 2012-06-06 DIAGNOSIS — R52 Pain, unspecified: Secondary | ICD-10-CM

## 2012-06-06 DIAGNOSIS — J019 Acute sinusitis, unspecified: Secondary | ICD-10-CM

## 2012-06-06 DIAGNOSIS — F329 Major depressive disorder, single episode, unspecified: Secondary | ICD-10-CM

## 2012-06-06 DIAGNOSIS — J029 Acute pharyngitis, unspecified: Secondary | ICD-10-CM

## 2012-06-06 DIAGNOSIS — G5600 Carpal tunnel syndrome, unspecified upper limb: Secondary | ICD-10-CM | POA: Insufficient documentation

## 2012-06-06 DIAGNOSIS — F17200 Nicotine dependence, unspecified, uncomplicated: Secondary | ICD-10-CM

## 2012-06-06 DIAGNOSIS — J40 Bronchitis, not specified as acute or chronic: Secondary | ICD-10-CM

## 2012-06-06 DIAGNOSIS — S129XXA Fracture of neck, unspecified, initial encounter: Secondary | ICD-10-CM

## 2012-06-06 MED ORDER — ALBUTEROL SULFATE HFA 108 (90 BASE) MCG/ACT IN AERS: 2.0000 | INHALATION_SPRAY | Freq: Four times a day (QID) | RESPIRATORY_TRACT | Status: DC | PRN

## 2012-06-06 MED ORDER — CIPROFLOXACIN HCL 500 MG PO TABS: 500.0000 mg | ORAL_TABLET | Freq: Two times a day (BID) | ORAL | Status: AC

## 2012-06-06 MED ORDER — CLONAZEPAM 0.5 MG PO TABS: 0.5000 mg | ORAL_TABLET | Freq: Two times a day (BID) | ORAL | Status: DC | PRN

## 2012-06-06 MED ORDER — OXYCODONE-ACETAMINOPHEN 10-325 MG PO TABS: 1.0000 | ORAL_TABLET | ORAL | Status: DC | PRN

## 2012-06-06 MED ORDER — GUAIFENESIN ER 600 MG PO TB12: 600.0000 mg | ORAL_TABLET | Freq: Two times a day (BID) | ORAL | Status: DC

## 2012-06-06 MED ORDER — METHYLPREDNISOLONE 4 MG PO KIT: PACK | ORAL | Status: AC

## 2012-06-06 NOTE — Patient Instructions (Addendum)

## 2012-06-06 NOTE — Assessment & Plan Note (Signed)
Down to 1/4 ppd, needs to quit altogether

## 2012-06-07 ENCOUNTER — Encounter: Payer: Self-pay | Admitting: Family Medicine

## 2012-06-07 NOTE — Assessment & Plan Note (Addendum)
Recurrent needs to stop smoking and she is given a course of Ciprofloxacin and Mucinex, given albuterol to use prn

## 2012-06-07 NOTE — Assessment & Plan Note (Signed)
Has been seen by [facility] they have switched her to Robaxin from [facility] and she feels it helps at night but she still does not tolerate this during the day. They have also switched her to Mobic from Ibuprofen and recommeded PT and asked [place] to continue the Percocet rx. We had her sign a new drug contract today and will proceed with random drug testing. She is in school full time and caring for her young children so is going to put off PT for now.

## 2012-06-07 NOTE — Progress Notes (Signed)
Patient ID: [name] [name], female   DOB: 1981-05-22, 31 y.o.   MRN: [id number] [name] [name] [id number] 08/09/1981 06/07/2012      Progress Note-Follow Up  Subjective  Chief Complaint  Chief Complaint  Patient presents with  . Follow-up    on back pain    HPI  Patient is a 31 year old Caucasian female who is in today for followup. She's recently been [facility] and they have decided not to proceed with surgery at this time. I placed on Robaxin multiple times a day but unfortunately she is only able to tolerate night. She does feel like they recommended physical therapy but she is in school full-time in today for her young children her grandmother so has not started physical therapy as far. She's also been diagnosed with carpal tunnel syndrome and is having a lot of pain in her hands and arms. She's also complaining of increased congestion over the last week. Cough, malaise, low-grade fevers, head congestion are noted. No chest pain palpitations, shortness of breath, GI or GU complaints noted.  Past Medical History  Diagnosis Date  . Mental disorder   . Headache   . Preterm labor   . Fracture of spinal vertebra 2009    Surgery in 2011  . Seizures     Last seizure 2005  . Hemorrhoids thrombosed   . Anxiety   . Postpartum depression 11/27/2011  . PHARYNGITIS 10/13/2010    Qualifier: Diagnosis of  By: Abner Greenspan MD, Misty Stanley    . Acute bronchitis 10/13/2010    Qualifier: Diagnosis of  By: Abner Greenspan MD, Misty Stanley    . Anxiety and depression 11/27/2011    Past Surgical History  Procedure Date  . Cervical fusion 05/26/2011    She thinks it was C2    Family History  Problem Relation Age of Onset  . Arthritis Mother   . Coronary artery disease Father     s/p MIs/ smoker  . Alcohol abuse Father   . Heart attack Father   . Endometriosis Sister   . Aplastic anemia Maternal Grandmother   . Arthritis Maternal Grandmother     s/p hip replacement for arthritis    . Anemia Maternal Grandmother     aplastic  . Scoliosis Maternal Grandfather     s/p surgeries  . Endometriosis Sister   . Cancer Sister     cervical  . Otitis media Daughter     History   Social History  . Marital Status: Single    Spouse Name: N/A    Number of Children: N/A  . Years of Education: N/A   Occupational History  . Not on file.   Social History Main Topics  . Smoking status: Current Everyday Smoker -- 0.5 packs/day for 17 years    Types: Cigarettes  . Smokeless tobacco: Never Used  . Alcohol Use: No  . Drug Use: No  . Sexually Active: Yes     tubal   Other Topics Concern  . Not on file   Social History Narrative  . No narrative on file    Current Outpatient Prescriptions on File Prior to Visit  Medication Sig Dispense Refill  . acetaminophen (TYLENOL) 500 MG tablet Take 500-1,000 mg by mouth daily as needed. For pain       . clonazePAM (KLONOPIN) 0.5 MG tablet Take 1 tablet (0.5 mg total) by mouth 2 (two) times daily as needed. For anxiety/ panic disorder  60 tablet  1  . docusate  sodium (COLACE) 50 MG capsule Take by mouth 3 (three) times daily as needed.      Marland Kitchen albuterol (PROVENTIL HFA;VENTOLIN HFA) 108 (90 BASE) MCG/ACT inhaler Inhale 2 puffs into the lungs every 6 (six) hours as needed for wheezing.  1 Inhaler  0  . DISCONTD: venlafaxine (EFFEXOR XR) 37.5 MG 24 hr capsule Take 1 capsule (37.5 mg total) by mouth daily.  30 capsule  1    Allergies  Allergen Reactions  . Cephalexin     Diarrhea, nausea, vomitting    Review of Systems  Review of Systems  Constitutional: Positive for malaise/fatigue. Negative for fever.  HENT: Positive for congestion and neck pain.   Eyes: Negative for discharge.  Respiratory: Positive for cough. Negative for shortness of breath.   Cardiovascular: Negative for chest pain, palpitations and leg swelling.  Gastrointestinal: Negative for nausea, abdominal pain and diarrhea.  Genitourinary: Negative for dysuria.   Musculoskeletal: Positive for back pain. Negative for falls.  Skin: Negative for rash.  Neurological: Negative for loss of consciousness and headaches.  Endo/Heme/Allergies: Negative for polydipsia.  Psychiatric/Behavioral: Negative for depression and suicidal ideas. The patient is nervous/anxious and has insomnia.     Objective  BP 116/77  Pulse 81  Temp 97.4 F (36.3 C) (Temporal)  Ht 5\' 6"  (1.676 m)  Wt 124 lb 12.8 oz (56.609 kg)  BMI 20.14 kg/m2  SpO2 100%  LMP 05/12/2012  Breastfeeding? No  Physical Exam  Physical Exam  Constitutional: She is oriented to person, place, and time and well-developed, well-nourished, and in no distress. No distress.  HENT:  Head: Normocephalic and atraumatic.  Eyes: Conjunctivae are normal.  Neck: Neck supple. No thyromegaly present.  Cardiovascular: Normal rate, regular rhythm and normal heart sounds.   No murmur heard. Pulmonary/Chest: Effort normal. She has wheezes.       Exp wheeze b/l bases  Abdominal: She exhibits no distension and no mass.  Musculoskeletal: She exhibits no edema.  Lymphadenopathy:    She has no cervical adenopathy.  Neurological: She is alert and oriented to person, place, and time.  Skin: Skin is warm and dry. No rash noted. She is not diaphoretic.  Psychiatric: Memory, affect and judgment normal.    Lab Results  Component Value Date   TSH 1.218 11/21/2011   Lab Results  Component Value Date   WBC 9.2 11/21/2011   HGB 14.0 11/21/2011   HCT 41.8 11/21/2011   MCV 97.9 11/21/2011   PLT 420* 11/21/2011   Lab Results  Component Value Date   CREATININE 0.82 11/21/2011   BUN 17 11/21/2011   NA 140 11/21/2011   K 4.5 11/21/2011   CL 107 11/21/2011   CO2 23 11/21/2011   Lab Results  Component Value Date   ALT 11 11/21/2011   AST 13 11/21/2011   ALKPHOS 118* 11/21/2011   BILITOT 0.6 11/21/2011   Lab Results  Component Value Date   CHOL 140 09/13/2010   Lab Results  Component Value Date   HDL 46.10  09/13/2010   Lab Results  Component Value Date   LDLCALC 88 09/13/2010   Lab Results  Component Value Date   TRIG 29.0 09/13/2010   Lab Results  Component Value Date   CHOLHDL 3 09/13/2010     Assessment & Plan  TOBACCO ABUSE Down to 1/4 ppd, needs to quit altogether  CTS (carpal tunnel syndrome) Diagnosed on recent trip to the [facility], encouraged ice and Aspercreme bid and given wrist splints  to use qhs and as needed during the day  VERTEBRAL FRACTURE, CERVICAL SPINE Has been seen by [facility] they have switched her to Robaxin from Flexeril and she feels it helps at night but she still does not tolerate this during the day. They have also switched her to Mobic from Ibuprofen and recommeded PT and asked [place] to continue the Percocet rx. We had her sign a new drug contract today and will proceed with random drug testing. She is in school full time and caring for her young children so is going to put off PT for now.  Acute bronchitis Recurrent needs to stop smoking and she is given a course of Ciprofloxacin and Mucinex, given albuterol to use prn  Anxiety and depression Has not tolerated the SSRIs but is doing better, may continue to use Klonazepam sparingly and for sleep

## 2012-06-07 NOTE — Assessment & Plan Note (Signed)
Has not tolerated the SSRIs but is doing better, may continue to use Klonazepam sparingly and for sleep

## 2012-06-07 NOTE — Assessment & Plan Note (Signed)
Diagnosed on recent trip to the [facility], encouraged ice and Aspercreme bid and given wrist splints to use qhs and as needed during the day

## 2012-07-10 ENCOUNTER — Ambulatory Visit (INDEPENDENT_AMBULATORY_CARE_PROVIDER_SITE_OTHER): Payer: Medicaid Other | Admitting: Family Medicine

## 2012-07-10 ENCOUNTER — Encounter: Payer: Self-pay | Admitting: Family Medicine

## 2012-07-10 VITALS — BP 122/81 | HR 105 | Temp 98.1°F | Ht 66.0 in | Wt 124.8 lb

## 2012-07-10 DIAGNOSIS — R112 Nausea with vomiting, unspecified: Secondary | ICD-10-CM

## 2012-07-10 DIAGNOSIS — R52 Pain, unspecified: Secondary | ICD-10-CM

## 2012-07-10 DIAGNOSIS — R569 Unspecified convulsions: Secondary | ICD-10-CM

## 2012-07-10 DIAGNOSIS — F17200 Nicotine dependence, unspecified, uncomplicated: Secondary | ICD-10-CM

## 2012-07-10 DIAGNOSIS — J40 Bronchitis, not specified as acute or chronic: Secondary | ICD-10-CM

## 2012-07-10 DIAGNOSIS — J069 Acute upper respiratory infection, unspecified: Secondary | ICD-10-CM

## 2012-07-10 HISTORY — DX: Nausea with vomiting, unspecified: R11.2

## 2012-07-10 MED ORDER — AZITHROMYCIN 250 MG PO TABS: ORAL_TABLET | ORAL | Status: DC

## 2012-07-10 MED ORDER — ONDANSETRON 4 MG PO TBDP: 4.0000 mg | ORAL_TABLET | Freq: Three times a day (TID) | ORAL | Status: DC | PRN

## 2012-07-10 MED ORDER — OXYCODONE-ACETAMINOPHEN 10-325 MG PO TABS: 1.0000 | ORAL_TABLET | ORAL | Status: DC | PRN

## 2012-07-10 MED ORDER — ALIGN PO CAPS: 1.0000 | ORAL_CAPSULE | Freq: Every day | ORAL | Status: DC

## 2012-07-10 MED ORDER — PROMETHAZINE HCL 25 MG PO TABS: 25.0000 mg | ORAL_TABLET | Freq: Three times a day (TID) | ORAL | Status: DC | PRN

## 2012-07-10 NOTE — Assessment & Plan Note (Signed)
Resolving but given refills on Promethazine and Ondansetron to use spraingly, prn

## 2012-07-10 NOTE — Progress Notes (Signed)
Patient ID: [name] [name], female   DOB: [birth year], 31 y.o.   MRN: [id number] LUNNA VOGELGESANG [id number] 1981/08/29 07/10/2012      Progress Note-Follow Up  Subjective  Chief Complaint  Chief Complaint  Patient presents with  . Follow-up    on bronchitis/ still not feeling well    HPI  Patient is a 31 year old Caucasian female who is in today complaining of roughly one week of illness. She has several children at home care for her ailing grandmother and mother. Over the last 2 weeks there've been running gastroenteritis in her home as well as sinus infections and bronchitis with a croupy cough. She has gone through a lot of congestion. She also had significant nausea vomiting fevers and anorexia several days ago. Those symptoms are somewhat improved but she continues to struggle with headache and sinus pressure as well as a cough that is productive of gray phlegm and fatigue. She did quit smoking about a week ago using a cigarette and is very pleased. No chest pain, palpitations or GU complaints are noted.   Past Medical History  Diagnosis Date  . Mental disorder   . Headache   . Preterm labor   . Fracture of spinal vertebra 2009    Surgery in 2011  . Seizures     Last seizure 2005  . Hemorrhoids thrombosed   . Anxiety   . Postpartum depression 11/27/2011  . PHARYNGITIS 10/13/2010    Qualifier: Diagnosis of  By: Abner Greenspan MD, Misty Stanley    . Acute bronchitis 10/13/2010    Qualifier: Diagnosis of  By: Abner Greenspan MD, Misty Stanley    . Anxiety and depression 11/27/2011  . Nausea & vomiting 07/10/2012    Past Surgical History  Procedure Date  . Cervical fusion 05/26/2011    She thinks it was C2    Family History  Problem Relation Age of Onset  . Arthritis Mother   . Coronary artery disease Father     s/p MIs/ smoker  . Alcohol abuse Father   . Heart attack Father   . Endometriosis Sister   . Aplastic anemia Maternal Grandmother   . Arthritis Maternal Grandmother     s/p hip replacement  for arthritis  . Anemia Maternal Grandmother     aplastic  . Scoliosis Maternal Grandfather     s/p surgeries  . Endometriosis Sister   . Cancer Sister     cervical  . Otitis media Daughter     History   Social History  . Marital Status: Single    Spouse Name: N/A    Number of Children: N/A  . Years of Education: N/A   Occupational History  . Not on file.   Social History Main Topics  . Smoking status: Current Every Day Smoker -- 0.5 packs/day for 17 years    Types: Cigarettes  . Smokeless tobacco: Never Used   Comment: e cigs  . Alcohol Use: No  . Drug Use: No  . Sexually Active: Yes     tubal   Other Topics Concern  . Not on file   Social History Narrative  . No narrative on file    Current Outpatient Prescriptions on File Prior to Visit  Medication Sig Dispense Refill  . albuterol (PROVENTIL HFA;VENTOLIN HFA) 108 (90 BASE) MCG/ACT inhaler Inhale 2 puffs into the lungs every 6 (six) hours as needed for wheezing.  1 Inhaler  0  . clonazePAM (KLONOPIN) 0.5 MG tablet Take 1 tablet (0.5 mg total) by mouth  2 (two) times daily as needed. For anxiety/ panic disorder  60 tablet  1  . docusate sodium (COLACE) 50 MG capsule Take by mouth 3 (three) times daily as needed.      Marland Kitchen guaiFENesin (MUCINEX) 600 MG 12 hr tablet Take 1 tablet (600 mg total) by mouth 2 (two) times daily.  20 tablet  0  . meloxicam (MOBIC) 15 MG tablet Take 15 mg by mouth daily.      Marland Kitchen acetaminophen (TYLENOL) 500 MG tablet Take 500-1,000 mg by mouth daily as needed. For pain       . DISCONTD: venlafaxine (EFFEXOR XR) 37.5 MG 24 hr capsule Take 1 capsule (37.5 mg total) by mouth daily.  30 capsule  1    Allergies  Allergen Reactions  . Cephalexin     Diarrhea, nausea, vomitting    Review of Systems  Review of Systems  Constitutional: Positive for malaise/fatigue. Negative for fever.  HENT: Positive for congestion and sore throat.   Eyes: Negative for discharge.  Respiratory: Positive for cough  and sputum production. Negative for shortness of breath and wheezing.   Cardiovascular: Negative for chest pain, palpitations and leg swelling.  Gastrointestinal: Positive for nausea and vomiting. Negative for abdominal pain and diarrhea.  Genitourinary: Negative for dysuria.  Musculoskeletal: Negative for falls.  Skin: Negative for rash.  Neurological: Positive for headaches. Negative for loss of consciousness.  Endo/Heme/Allergies: Negative for polydipsia.  Psychiatric/Behavioral: Negative for depression and suicidal ideas. The patient is not nervous/anxious and does not have insomnia.     Objective  BP 122/81  Pulse 105  Temp 98.1 F (36.7 C) (Temporal)  Ht 5\' 6"  (1.676 m)  Wt 124 lb 12.8 oz (56.609 kg)  BMI 20.14 kg/m2  SpO2 98%  LMP 07/09/2012  Breastfeeding? No  Physical Exam  Physical Exam  Constitutional: She is oriented to person, place, and time and well-developed, well-nourished, and in no distress. No distress.  HENT:  Head: Normocephalic and atraumatic.  Eyes: Conjunctivae normal are normal.  Neck: Neck supple. No thyromegaly present.  Cardiovascular: Normal rate, regular rhythm and normal heart sounds.   No murmur heard. Pulmonary/Chest: Effort normal and breath sounds normal. She has no wheezes.       Decreased breath sounds b/l bases  Abdominal: She exhibits no distension and no mass.  Musculoskeletal: She exhibits no edema.  Lymphadenopathy:    She has no cervical adenopathy.  Neurological: She is alert and oriented to person, place, and time.  Skin: Skin is warm and dry. No rash noted. She is not diaphoretic.  Psychiatric: Memory, affect and judgment normal.    Lab Results  Component Value Date   TSH 1.218 11/21/2011   Lab Results  Component Value Date   WBC 9.2 11/21/2011   HGB 14.0 11/21/2011   HCT 41.8 11/21/2011   MCV 97.9 11/21/2011   PLT 420* 11/21/2011   Lab Results  Component Value Date   CREATININE 0.82 11/21/2011   BUN 17 11/21/2011    NA 140 11/21/2011   K 4.5 11/21/2011   CL 107 11/21/2011   CO2 23 11/21/2011   Lab Results  Component Value Date   ALT 11 11/21/2011   AST 13 11/21/2011   ALKPHOS 118* 11/21/2011   BILITOT 0.6 11/21/2011   Lab Results  Component Value Date   CHOL 140 09/13/2010   Lab Results  Component Value Date   HDL 46.10 09/13/2010   Lab Results  Component Value Date   LDLCALC 88  09/13/2010   Lab Results  Component Value Date   TRIG 29.0 09/13/2010   Lab Results  Component Value Date   CHOLHDL 3 09/13/2010     Assessment & Plan  UPPER RESPIRATORY INFECTION Given Zpak and Mucinex, call if no improvement, increase rest and hydration  TOBACCO ABUSE Had her last cigarette a week ago is using an E. cigarette. Is encouraged to try to only use to be separate if she is unable to resist after 10 minutes of waiting. And certainly encouraged to continue avoiding cigarettes all costs. Reassess him  SEIZURE DISORDER No recent episodes.  Nausea & vomiting Resolving but given refills on Promethazine and Ondansetron to use spraingly, prn

## 2012-07-10 NOTE — Assessment & Plan Note (Signed)
Given Zpak and Mucinex, call if no improvement, increase rest and hydration

## 2012-07-10 NOTE — Patient Instructions (Addendum)

## 2012-07-10 NOTE — Assessment & Plan Note (Signed)
No recent episodes

## 2012-07-10 NOTE — Assessment & Plan Note (Signed)
Had her last cigarette a week ago is using an E. cigarette. Is encouraged to try to only use to be separate if she is unable to resist after 10 minutes of waiting. And certainly encouraged to continue avoiding cigarettes all costs. Reassess him

## 2012-08-06 ENCOUNTER — Ambulatory Visit (INDEPENDENT_AMBULATORY_CARE_PROVIDER_SITE_OTHER): Payer: Medicaid Other | Admitting: Family Medicine

## 2012-08-06 ENCOUNTER — Encounter: Payer: Self-pay | Admitting: Family Medicine

## 2012-08-06 VITALS — BP 111/63 | HR 97 | Temp 98.6°F | Ht 66.0 in | Wt 128.8 lb

## 2012-08-06 DIAGNOSIS — F17200 Nicotine dependence, unspecified, uncomplicated: Secondary | ICD-10-CM

## 2012-08-06 DIAGNOSIS — R569 Unspecified convulsions: Secondary | ICD-10-CM

## 2012-08-06 DIAGNOSIS — F410 Panic disorder [episodic paroxysmal anxiety] without agoraphobia: Secondary | ICD-10-CM

## 2012-08-06 DIAGNOSIS — R52 Pain, unspecified: Secondary | ICD-10-CM

## 2012-08-06 DIAGNOSIS — Z23 Encounter for immunization: Secondary | ICD-10-CM

## 2012-08-06 DIAGNOSIS — F341 Dysthymic disorder: Secondary | ICD-10-CM

## 2012-08-06 DIAGNOSIS — S129XXA Fracture of neck, unspecified, initial encounter: Secondary | ICD-10-CM

## 2012-08-06 DIAGNOSIS — F419 Anxiety disorder, unspecified: Secondary | ICD-10-CM

## 2012-08-06 DIAGNOSIS — J069 Acute upper respiratory infection, unspecified: Secondary | ICD-10-CM

## 2012-08-06 DIAGNOSIS — F329 Major depressive disorder, single episode, unspecified: Secondary | ICD-10-CM

## 2012-08-06 MED ORDER — OXYCODONE-ACETAMINOPHEN 10-325 MG PO TABS: 1.0000 | ORAL_TABLET | ORAL | Status: DC | PRN

## 2012-08-06 MED ORDER — CLONAZEPAM 0.5 MG PO TABS: 0.5000 mg | ORAL_TABLET | Freq: Two times a day (BID) | ORAL | Status: DC | PRN

## 2012-08-06 NOTE — Assessment & Plan Note (Signed)
Has cleared her lungs altogether

## 2012-08-06 NOTE — Assessment & Plan Note (Signed)
No recent activity. 

## 2012-08-06 NOTE — Assessment & Plan Note (Signed)
Has been using her Klonopin a bit more but still within prescribed limits secondary to her sister having her first child 2 months prematurely in [place] and the patient driving back and forth to help out. Is given a refill today

## 2012-08-06 NOTE — Assessment & Plan Note (Signed)
Doing well, stable on low doses of Oxycodone and using Meloxicam. Refills given today

## 2012-08-06 NOTE — Progress Notes (Signed)
Patient ID: [name] [name], female   DOB: 08-Dec-1980, 31 y.o.   MRN: [id number] [name] [name] [id number] 23-Jul-1981 08/06/2012      Progress Note-Follow Up  Subjective  Chief Complaint  Chief Complaint  Patient presents with  . Follow-up    HPI Patient is a 31 year old caucasian female actually doing very well. She has stopped smoking cigarettes using any cigarettes. She has been titrating herself down and reports that her breathing is better. There's been no signs of pharyngitis or bronchitis and she did this her headaches are somewhat better and she's not had any seizures. She is under a great deal of stress secondary to her sister having her first baby 2 months premature in [place]. She has been driving back and forth to help out. She is not the actual driver. She has been using her Klonopin twice daily more frequently than usual but finds that is helpful. Her back pain and neck pain are tolerable using low-dose oxycodone and meloxicam and she offers no new complaints. No chest pain, palpitations, shortness of breath, GI or GU  Past Medical History  Diagnosis Date  . Mental disorder   . Headache   . Preterm labor   . Fracture of spinal vertebra 2009    Surgery in 2011  . Seizures     Last seizure 2005  . Hemorrhoids thrombosed   . Anxiety   . Postpartum depression 11/27/2011  . PHARYNGITIS 10/13/2010    Qualifier: Diagnosis of  By: Abner Greenspan MD, Misty Stanley    . Acute bronchitis 10/13/2010    Qualifier: Diagnosis of  By: Abner Greenspan MD, Misty Stanley    . Anxiety and depression 11/27/2011  . Nausea & vomiting 07/10/2012    Past Surgical History  Procedure Date  . Cervical fusion 05/26/2011    She thinks it was C2    Family History  Problem Relation Age of Onset  . Arthritis Mother   . Coronary artery disease Father     s/p MIs/ smoker  . Alcohol abuse Father   . Heart attack Father   . Endometriosis Sister   . Aplastic anemia Maternal Grandmother   . Arthritis Maternal Grandmother     s/p hip replacement for arthritis  . Anemia Maternal Grandmother     aplastic  . Scoliosis Maternal Grandfather     s/p surgeries  . Endometriosis Sister   . Cancer Sister     cervical  . Otitis media Daughter     History   Social History  . Marital Status: Single    Spouse Name: N/A    Number of Children: N/A  . Years of Education: N/A   Occupational History  . Not on file.   Social History Main Topics  . Smoking status: Current Every Day Smoker -- 0.5 packs/day for 17 years    Types: Cigarettes  . Smokeless tobacco: Never Used   Comment: e cigs  . Alcohol Use: No  . Drug Use: No  . Sexually Active: Yes     tubal   Other Topics Concern  . Not on file   Social History Narrative  . No narrative on file    Current Outpatient Prescriptions on File Prior to Visit  Medication Sig Dispense Refill  . acetaminophen (TYLENOL) 500 MG tablet Take 500-1,000 mg by mouth daily as needed. For pain       . docusate sodium (COLACE) 50 MG capsule Take by mouth 3 (three) times daily as needed.      Marland Kitchen  guaiFENesin (MUCINEX) 600 MG 12 hr tablet Take 1 tablet (600 mg total) by mouth 2 (two) times daily.  20 tablet  0  . meloxicam (MOBIC) 15 MG tablet Take 15 mg by mouth daily.      . ondansetron (ZOFRAN ODT) 4 MG disintegrating tablet Take 1 tablet (4 mg total) by mouth every 8 (eight) hours as needed for nausea.  20 tablet  0  . promethazine (PHENERGAN) 25 MG tablet Take 1 tablet (25 mg total) by mouth every 8 (eight) hours as needed for nausea. For nausea.  30 tablet  1  . DISCONTD: clonazePAM (KLONOPIN) 0.5 MG tablet Take 1 tablet (0.5 mg total) by mouth 2 (two) times daily as needed. For anxiety/ panic disorder  60 tablet  1  . bifidobacterium infantis (ALIGN) capsule Take 1 capsule by mouth daily.      Marland Kitchen DISCONTD: venlafaxine (EFFEXOR XR) 37.5 MG 24 hr capsule Take 1 capsule (37.5 mg total) by mouth daily.  30 capsule  1    Allergies  Allergen Reactions  . Cephalexin      Diarrhea, nausea, vomitting    Review of Systems  Review of Systems  Constitutional: Positive for malaise/fatigue. Negative for fever.  HENT: Negative for congestion.   Eyes: Negative for discharge.  Respiratory: Negative for shortness of breath.   Cardiovascular: Negative for chest pain, palpitations and leg swelling.  Gastrointestinal: Negative for nausea, abdominal pain and diarrhea.  Genitourinary: Negative for dysuria.  Musculoskeletal: Positive for back pain. Negative for falls.  Skin: Negative for rash.  Neurological: Negative for loss of consciousness and headaches.  Endo/Heme/Allergies: Negative for polydipsia.  Psychiatric/Behavioral: Negative for depression and suicidal ideas. The patient is nervous/anxious. The patient does not have insomnia.     Objective  BP 111/63  Pulse 97  Temp 98.6 F (37 C) (Temporal)  Ht 5\' 6"  (1.676 m)  Wt 128 lb 12.8 oz (58.423 kg)  BMI 20.79 kg/m2  SpO2 99%  LMP 07/07/2012  Breastfeeding? No  Physical Exam  Physical Exam  Constitutional: She is oriented to person, place, and time and well-developed, well-nourished, and in no distress. No distress.  HENT:  Head: Normocephalic and atraumatic.  Eyes: Conjunctivae normal are normal.  Neck: Neck supple. No thyromegaly present.  Cardiovascular: Normal rate, regular rhythm and normal heart sounds.   No murmur heard. Pulmonary/Chest: Effort normal and breath sounds normal. She has no wheezes.  Abdominal: She exhibits no distension and no mass.  Musculoskeletal: She exhibits no edema.  Lymphadenopathy:    She has no cervical adenopathy.  Neurological: She is alert and oriented to person, place, and time.  Skin: Skin is warm and dry. No rash noted. She is not diaphoretic.  Psychiatric: Memory, affect and judgment normal.    Lab Results  Component Value Date   TSH 1.218 11/21/2011   Lab Results  Component Value Date   WBC 9.2 11/21/2011   HGB 14.0 11/21/2011   HCT 41.8 11/21/2011     MCV 97.9 11/21/2011   PLT 420* 11/21/2011   Lab Results  Component Value Date   CREATININE 0.82 11/21/2011   BUN 17 11/21/2011   NA 140 11/21/2011   K 4.5 11/21/2011   CL 107 11/21/2011   CO2 23 11/21/2011   Lab Results  Component Value Date   ALT 11 11/21/2011   AST 13 11/21/2011   ALKPHOS 118* 11/21/2011   BILITOT 0.6 11/21/2011   Lab Results  Component Value Date   CHOL 140 09/13/2010  Lab Results  Component Value Date   HDL 46.10 09/13/2010   Lab Results  Component Value Date   LDLCALC 88 09/13/2010   Lab Results  Component Value Date   TRIG 29.0 09/13/2010   Lab Results  Component Value Date   CHOLHDL 3 09/13/2010     Assessment & Plan  TOBACCO ABUSE Has switched to Ecig and is not smoking any cigarettes at all, is encouraged to quit the ecig altogether.  UPPER RESPIRATORY INFECTION Has cleared her lungs altogether  VERTEBRAL FRACTURE, CERVICAL SPINE Doing well, stable on low doses of Oxycodone and using Meloxicam. Refills given today  PANIC DISORDER Has been using her Klonopin a bit more but still within prescribed limits secondary to her sister having her first child 2 months prematurely in [place] and the patient driving back and forth to help out. Is given a refill today  SEIZURE DISORDER No recent activity

## 2012-08-06 NOTE — Assessment & Plan Note (Signed)
Has switched to Ecig and is not smoking any cigarettes at all, is encouraged to quit the ecig altogether.

## 2012-09-03 ENCOUNTER — Other Ambulatory Visit: Payer: Self-pay | Admitting: *Deleted

## 2012-09-03 DIAGNOSIS — R52 Pain, unspecified: Secondary | ICD-10-CM

## 2012-09-03 MED ORDER — OXYCODONE-ACETAMINOPHEN 10-325 MG PO TABS: 1.0000 | ORAL_TABLET | ORAL | Status: DC | PRN

## 2012-09-03 NOTE — Telephone Encounter (Signed)
Refill request for OXYCODONE Last filled- 08/06/12, and pt will run out this week Pt is in [place] helping sister and states her mother will pick up when ready and will bring to her in [place] closer to the end of the week. Is Refill OK?

## 2012-10-01 ENCOUNTER — Other Ambulatory Visit: Payer: Self-pay | Admitting: Family Medicine

## 2012-10-01 DIAGNOSIS — R52 Pain, unspecified: Secondary | ICD-10-CM

## 2012-10-01 MED ORDER — OXYCODONE-ACETAMINOPHEN 10-325 MG PO TABS: 1.0000 | ORAL_TABLET | ORAL | Status: DC | PRN

## 2012-10-01 NOTE — Telephone Encounter (Signed)
Patient has a question about her medicine. Please contact patient.

## 2012-10-01 NOTE — Telephone Encounter (Signed)
Please advise pain med refill? Pt states she is coming in for a pap and physical in January. Last RX was 09-03-12 quantity 180 with 0 refills

## 2012-10-01 NOTE — Telephone Encounter (Signed)
Nikki advised pt that RX could be picked up after 4 pm today

## 2012-10-07 ENCOUNTER — Other Ambulatory Visit: Payer: Self-pay | Admitting: Family Medicine

## 2012-10-08 NOTE — Telephone Encounter (Signed)
Faxed to pharmacy

## 2012-10-08 NOTE — Telephone Encounter (Signed)
Please advise refill? Last RX wrote on 08-06-12 quantity 60 with 1 refill.  If ok fax to [phone]

## 2012-10-24 NOTE — L&D Delivery Note (Signed)
Delivery Note At 4:59 PM a viable female was delivered via  (Presentation: ;  ).  APGAR: , ; weight .   Placenta status: , .  Cord:  with the following complications: .  Cord pH: not done  Anesthesia: Epidural  Episiotomy:  Lacerations:  Suture Repair: 2.0 Est. Blood Loss (mL):   Mom to postpartum.  Baby to nursery-stable.  Norma Montemurro A 07/02/2013, 5:06 PM

## 2012-10-30 ENCOUNTER — Encounter: Payer: Self-pay | Admitting: Family Medicine

## 2012-10-30 ENCOUNTER — Ambulatory Visit (INDEPENDENT_AMBULATORY_CARE_PROVIDER_SITE_OTHER): Payer: Medicaid Other | Admitting: Family Medicine

## 2012-10-30 VITALS — BP 106/71 | HR 110 | Temp 99.8°F | Ht 66.0 in | Wt 125.1 lb

## 2012-10-30 DIAGNOSIS — S129XXA Fracture of neck, unspecified, initial encounter: Secondary | ICD-10-CM

## 2012-10-30 DIAGNOSIS — F341 Dysthymic disorder: Secondary | ICD-10-CM

## 2012-10-30 DIAGNOSIS — J40 Bronchitis, not specified as acute or chronic: Secondary | ICD-10-CM

## 2012-10-30 DIAGNOSIS — F411 Generalized anxiety disorder: Secondary | ICD-10-CM

## 2012-10-30 DIAGNOSIS — R52 Pain, unspecified: Secondary | ICD-10-CM

## 2012-10-30 DIAGNOSIS — J069 Acute upper respiratory infection, unspecified: Secondary | ICD-10-CM

## 2012-10-30 DIAGNOSIS — F419 Anxiety disorder, unspecified: Secondary | ICD-10-CM

## 2012-10-30 DIAGNOSIS — F17200 Nicotine dependence, unspecified, uncomplicated: Secondary | ICD-10-CM

## 2012-10-30 MED ORDER — METHYLPREDNISOLONE 4 MG PO KIT: PACK | ORAL | Status: DC

## 2012-10-30 MED ORDER — CLONAZEPAM 0.5 MG PO TABS: 0.5000 mg | ORAL_TABLET | Freq: Two times a day (BID) | ORAL | Status: DC | PRN

## 2012-10-30 MED ORDER — AZITHROMYCIN 250 MG PO TABS: ORAL_TABLET | ORAL | Status: DC

## 2012-10-30 MED ORDER — OXYCODONE-ACETAMINOPHEN 10-325 MG PO TABS: 1.0000 | ORAL_TABLET | ORAL | Status: DC | PRN

## 2012-10-30 NOTE — Patient Instructions (Addendum)

## 2012-10-30 NOTE — Assessment & Plan Note (Signed)
Down to just 3 cigarettes daily encouraged complete cessation

## 2012-10-30 NOTE — Progress Notes (Signed)
Patient ID: [name] [name], female   DOB: 1980-11-13, 31 y.o.   MRN: [id number] [name] [name] [id number] Feb 08, 1981 10/30/2012      Progress Note-Follow Up  Subjective  Chief Complaint  Chief Complaint  Patient presents with  . Follow-up  . Nasal Congestion    Chest congestion, cough w/phlegm (green) X 2-3 weeks    HPI  Patient is a 32 year old Caucasian female who is in today for a 2 to three-week history of persistent cough. Cough is productive intermittently of green phlegm. No fevers or chills but she does have some malaise. She does use the over-the-counter with some partial temporary relief. She struggling with some myalgias and mild anorexia. Is down to just 3 cigarettes daily. No ear pain or throat pain noted. No significant, chest pain palpitations or shortness of breath. She continues to do a great deal of stress living with her mother but also can for her 3 young children as well as the infant child of her younger sister. Her mom also has custody of her older sister's children in the house is very hectic. Despite this she is doing well and using her Klonopin infrequently  Past Medical History  Diagnosis Date  . Mental disorder   . Headache   . Preterm labor   . Fracture of spinal vertebra 2009    Surgery in 2011  . Seizures     Last seizure 2005  . Hemorrhoids thrombosed   . Anxiety   . Postpartum depression 11/27/2011  . PHARYNGITIS 10/13/2010    Qualifier: Diagnosis of  By: Abner Greenspan MD, Misty Stanley    . Acute bronchitis 10/13/2010    Qualifier: Diagnosis of  By: Abner Greenspan MD, Misty Stanley    . Anxiety and depression 11/27/2011  . Nausea & vomiting 07/10/2012    Past Surgical History  Procedure Date  . Cervical fusion 05/26/2011    She thinks it was C2    Family History  Problem Relation Age of Onset  . Arthritis Mother   . Coronary artery disease Father     s/p MIs/ smoker  . Alcohol abuse Father   . Heart attack Father   . Endometriosis Sister   . Aplastic anemia  Maternal Grandmother   . Arthritis Maternal Grandmother     s/p hip replacement for arthritis  . Anemia Maternal Grandmother     aplastic  . Scoliosis Maternal Grandfather     s/p surgeries  . Endometriosis Sister   . Cancer Sister     cervical  . Otitis media Daughter     History   Social History  . Marital Status: Single    Spouse Name: N/A    Number of Children: N/A  . Years of Education: N/A   Occupational History  . Not on file.   Social History Main Topics  . Smoking status: Current Every Day Smoker -- 0.5 packs/day for 17 years    Types: Cigarettes  . Smokeless tobacco: Never Used     Comment: e cigs  . Alcohol Use: No  . Drug Use: No  . Sexually Active: Yes     Comment: tubal   Other Topics Concern  . Not on file   Social History Narrative  . No narrative on file    Current Outpatient Prescriptions on File Prior to Visit  Medication Sig Dispense Refill  . acetaminophen (TYLENOL) 500 MG tablet Take 500-1,000 mg by mouth daily as needed. For pain       . clonazePAM (KLONOPIN) 0.5  MG tablet Take 1 tablet (0.5 mg total) by mouth 2 (two) times daily as needed for anxiety.  60 tablet  2  . docusate sodium (COLACE) 50 MG capsule Take by mouth 3 (three) times daily as needed.      Marland Kitchen guaiFENesin (MUCINEX) 600 MG 12 hr tablet Take 1 tablet (600 mg total) by mouth 2 (two) times daily.  20 tablet  0  . ondansetron (ZOFRAN ODT) 4 MG disintegrating tablet Take 1 tablet (4 mg total) by mouth every 8 (eight) hours as needed for nausea.  20 tablet  0  . promethazine (PHENERGAN) 25 MG tablet Take 1 tablet (25 mg total) by mouth every 8 (eight) hours as needed for nausea. For nausea.  30 tablet  1  . bifidobacterium infantis (ALIGN) capsule Take 1 capsule by mouth daily.      . [DISCONTINUED] venlafaxine (EFFEXOR XR) 37.5 MG 24 hr capsule Take 1 capsule (37.5 mg total) by mouth daily.  30 capsule  1    Allergies  Allergen Reactions  . Cephalexin     Diarrhea, nausea,  vomitting    Review of Systems  Review of Systems  Constitutional: Positive for malaise/fatigue. Negative for fever.  HENT: Positive for congestion.   Eyes: Negative for discharge.  Respiratory: Positive for cough and sputum production. Negative for shortness of breath.   Cardiovascular: Negative for chest pain, palpitations and leg swelling.  Gastrointestinal: Negative for nausea, abdominal pain and diarrhea.  Genitourinary: Negative for dysuria.  Musculoskeletal: Negative for falls.  Skin: Negative for rash.  Neurological: Negative for loss of consciousness and headaches.  Endo/Heme/Allergies: Negative for polydipsia.  Psychiatric/Behavioral: Negative for depression and suicidal ideas. The patient is not nervous/anxious and does not have insomnia.     Objective  BP 106/71  Pulse 110  Temp 99.8 F (37.7 C) (Temporal)  Ht 5\' 6"  (1.676 m)  Wt 125 lb 1.9 oz (56.754 kg)  BMI 20.19 kg/m2  SpO2 98%  LMP 10/07/2012  Breastfeeding? No  Physical Exam  Physical Exam  Constitutional: She is oriented to person, place, and time and well-developed, well-nourished, and in no distress. No distress.  HENT:  Head: Normocephalic and atraumatic.  Eyes: Conjunctivae normal are normal.  Neck: Neck supple. No thyromegaly present.  Cardiovascular: Normal rate, regular rhythm and normal heart sounds.   No murmur heard. Pulmonary/Chest: Effort normal. She has no wheezes.       Rhonchi RLL  Abdominal: She exhibits no distension and no mass.  Musculoskeletal: She exhibits no edema.  Lymphadenopathy:    She has no cervical adenopathy.  Neurological: She is alert and oriented to person, place, and time.  Skin: Skin is warm and dry. No rash noted. She is not diaphoretic.  Psychiatric: Memory, affect and judgment normal.    Lab Results  Component Value Date   TSH 1.218 11/21/2011   Lab Results  Component Value Date   WBC 9.2 11/21/2011   HGB 14.0 11/21/2011   HCT 41.8 11/21/2011   MCV  97.9 11/21/2011   PLT 420* 11/21/2011   Lab Results  Component Value Date   CREATININE 0.82 11/21/2011   BUN 17 11/21/2011   NA 140 11/21/2011   K 4.5 11/21/2011   CL 107 11/21/2011   CO2 23 11/21/2011   Lab Results  Component Value Date   ALT 11 11/21/2011   AST 13 11/21/2011   ALKPHOS 118* 11/21/2011   BILITOT 0.6 11/21/2011   Lab Results  Component Value Date  CHOL 140 09/13/2010   Lab Results  Component Value Date   HDL 46.10 09/13/2010   Lab Results  Component Value Date   LDLCALC 88 09/13/2010   Lab Results  Component Value Date   TRIG 29.0 09/13/2010   Lab Results  Component Value Date   CHOLHDL 3 09/13/2010     Assessment & Plan  Anxiety and depression Given a refill on her Clonazepam to use prn has been able to only fill the prescription as prescribed.  VERTEBRAL FRACTURE, CERVICAL SPINE Tolerating her pain on Percocet use prn, is now caring for her 3 young children as well as her sister's baby well with manageable pain  UPPER RESPIRATORY INFECTION Given an rx for Zpak and Medrol dose pak, continue Mucinex and probiotics, increase hydration  TOBACCO ABUSE Down to just 3 cigarettes daily encouraged complete cessation

## 2012-10-30 NOTE — Assessment & Plan Note (Signed)
Given a refill on her Clonazepam to use prn has been able to only fill the prescription as prescribed.

## 2012-10-30 NOTE — Assessment & Plan Note (Signed)
Tolerating her pain on Percocet use prn, is now caring for her 3 young children as well as her sister's baby well with manageable pain

## 2012-10-30 NOTE — Assessment & Plan Note (Signed)
Given an rx for Zpak and Medrol dose pak, continue Mucinex and probiotics, increase hydration

## 2012-11-02 ENCOUNTER — Other Ambulatory Visit: Payer: Self-pay

## 2012-11-02 DIAGNOSIS — J40 Bronchitis, not specified as acute or chronic: Secondary | ICD-10-CM

## 2012-11-02 MED ORDER — AZITHROMYCIN 250 MG PO TABS: ORAL_TABLET | ORAL | Status: DC

## 2012-11-02 NOTE — Telephone Encounter (Signed)
Pt called in stating that her mom accidentally threw away her Zpak RX and would like a new one sent to [facility] in [place].  Verbal per Dr Abner Greenspan ok to send in another RX

## 2012-11-26 ENCOUNTER — Other Ambulatory Visit: Payer: Self-pay

## 2012-11-26 DIAGNOSIS — R52 Pain, unspecified: Secondary | ICD-10-CM

## 2012-11-26 MED ORDER — OXYCODONE-ACETAMINOPHEN 10-325 MG PO TABS: 1.0000 | ORAL_TABLET | ORAL | Status: DC | PRN

## 2012-11-26 NOTE — Telephone Encounter (Signed)
Pt informed per MD she can pick up RX on Thursday 11-29-12. RX in front cabinet

## 2012-11-26 NOTE — Telephone Encounter (Signed)
Pt called stating she will be out of her pain medication this week. Last RX refill was 10-30-12, please advise refill?

## 2012-12-26 ENCOUNTER — Other Ambulatory Visit: Payer: Self-pay

## 2012-12-26 MED ORDER — OXYCODONE-ACETAMINOPHEN 10-325 MG PO TABS: 1.0000 | ORAL_TABLET | ORAL | Status: DC | PRN

## 2012-12-26 NOTE — Telephone Encounter (Signed)
Pt informed that RX is ready to be picked up. RX at front desk

## 2012-12-26 NOTE — Telephone Encounter (Signed)
Pt left a message that she is going to be running out of her Oxy this weekend and would like to pick the RX up in OR.  Last RX was wrote on 11-26-12 quantity 180 with 0 refills. Pt also stated she would make an appt for next week  Please advise?

## 2013-01-21 ENCOUNTER — Telehealth: Payer: Self-pay

## 2013-01-21 NOTE — Telephone Encounter (Signed)
Patient left a message stating that she needs her pain medication refilled but is supposed to have a follow up appt. Pt states they can get her in at the OR office Friday at 10 but she can't do that due to her mom working. I ok'd for pt to be put on 01-24-13 at 3:30 at [facility]. Pt informed and voiced understanding

## 2013-01-24 ENCOUNTER — Ambulatory Visit (INDEPENDENT_AMBULATORY_CARE_PROVIDER_SITE_OTHER): Payer: Medicaid Other | Admitting: Family Medicine

## 2013-01-24 ENCOUNTER — Encounter: Payer: Self-pay | Admitting: Family Medicine

## 2013-01-24 VITALS — BP 128/72 | HR 111 | Temp 97.8°F | Ht 66.0 in | Wt 126.0 lb

## 2013-01-24 DIAGNOSIS — R569 Unspecified convulsions: Secondary | ICD-10-CM

## 2013-01-24 DIAGNOSIS — F411 Generalized anxiety disorder: Secondary | ICD-10-CM

## 2013-01-24 DIAGNOSIS — F341 Dysthymic disorder: Secondary | ICD-10-CM

## 2013-01-24 DIAGNOSIS — R112 Nausea with vomiting, unspecified: Secondary | ICD-10-CM

## 2013-01-24 DIAGNOSIS — R52 Pain, unspecified: Secondary | ICD-10-CM

## 2013-01-24 DIAGNOSIS — M479 Spondylosis, unspecified: Secondary | ICD-10-CM

## 2013-01-24 DIAGNOSIS — F17200 Nicotine dependence, unspecified, uncomplicated: Secondary | ICD-10-CM

## 2013-01-24 DIAGNOSIS — K5900 Constipation, unspecified: Secondary | ICD-10-CM

## 2013-01-24 DIAGNOSIS — F419 Anxiety disorder, unspecified: Secondary | ICD-10-CM

## 2013-01-24 MED ORDER — SENNA-DOCUSATE SODIUM 8.6-50 MG PO TABS: ORAL_TABLET | ORAL | Status: DC

## 2013-01-24 MED ORDER — CLONAZEPAM 0.5 MG PO TABS: 0.5000 mg | ORAL_TABLET | Freq: Two times a day (BID) | ORAL | Status: DC | PRN

## 2013-01-24 MED ORDER — OXYCODONE-ACETAMINOPHEN 10-325 MG PO TABS: 1.0000 | ORAL_TABLET | ORAL | Status: DC | PRN

## 2013-01-24 MED ORDER — ONDANSETRON 4 MG PO TBDP: 4.0000 mg | ORAL_TABLET | Freq: Three times a day (TID) | ORAL | Status: DC | PRN

## 2013-01-24 NOTE — Patient Instructions (Addendum)
Try a probiotic such as Digestive Advantage or a generic daily  Next visit annual gyn exam   Constipation, Adult Constipation is when a person has fewer than 3 bowel movements a week; has difficulty having a bowel movement; or has stools that are dry, hard, or larger than normal. As people grow older, constipation is more common. If you try to fix constipation with medicines that make you have a bowel movement (laxatives), the problem may get worse. Long-term laxative use may cause the muscles of the colon to become weak. A low-fiber diet, not taking in enough fluids, and taking certain medicines may make constipation worse. CAUSES   Certain medicines, such as antidepressants, pain medicine, iron supplements, antacids, and water pills.   Certain diseases, such as diabetes, irritable bowel syndrome (IBS), thyroid disease, or depression.   Not drinking enough water.   Not eating enough fiber-rich foods.   Stress or travel.  Lack of physical activity or exercise.  Not going to the restroom when there is the urge to have a bowel movement.  Ignoring the urge to have a bowel movement.  Using laxatives too much. SYMPTOMS   Having fewer than 3 bowel movements a week.   Straining to have a bowel movement.   Having hard, dry, or larger than normal stools.   Feeling full or bloated.   Pain in the lower abdomen.  Not feeling relief after having a bowel movement. DIAGNOSIS  Your caregiver will take a medical history and perform a physical exam. Further testing may be done for severe constipation. Some tests may include:   A barium enema X-ray to examine your rectum, colon, and sometimes, your small intestine.  A sigmoidoscopy to examine your lower colon.  A colonoscopy to examine your entire colon. TREATMENT  Treatment will depend on the severity of your constipation and what is causing it. Some dietary treatments include drinking more fluids and eating more fiber-rich  foods. Lifestyle treatments may include regular exercise. If these diet and lifestyle recommendations do not help, your caregiver may recommend taking over-the-counter laxative medicines to help you have bowel movements. Prescription medicines may be prescribed if over-the-counter medicines do not work.  HOME CARE INSTRUCTIONS   Increase dietary fiber in your diet, such as fruits, vegetables, whole grains, and beans. Limit high-fat and processed sugars in your diet, such as [place] fries, hamburgers, cookies, candies, and soda.   A fiber supplement may be added to your diet if you cannot get enough fiber from foods.   Drink enough fluids to keep your urine clear or pale yellow.   Exercise regularly or as directed by your caregiver.   Go to the restroom when you have the urge to go. Do not hold it.  Only take medicines as directed by your caregiver. Do not take other medicines for constipation without talking to your caregiver first. SEEK IMMEDIATE MEDICAL CARE IF:   You have bright red blood in your stool.   Your constipation lasts for more than 4 days or gets worse.   You have abdominal or rectal pain.   You have thin, pencil-like stools.  You have unexplained weight loss. MAKE SURE YOU:   Understand these instructions.  Will watch your condition.  Will get help right away if you are not doing well or get worse. Document Released: 07/08/2004 Document Revised: 01/02/2012 Document Reviewed: 09/13/2011 [facility] Patient Information 2013 [place], [place].

## 2013-01-25 ENCOUNTER — Encounter: Payer: Self-pay | Admitting: Family Medicine

## 2013-01-25 NOTE — Assessment & Plan Note (Signed)
Patient with ongoing neck and back pain, is doing relatively well on current dose of meds, acknowledges increased pain with increased lifting of kids but does not feel she needs any further meds. Given refill today

## 2013-01-25 NOTE — Progress Notes (Signed)
Patient ID: [name] [name], female   DOB: August 30, 1981, 30 y.o.   MRN: [id number] [name] [name] [id number] January 03, 1981 01/25/2013      Progress Note-Follow Up  Subjective  Chief Complaint  Chief Complaint  Patient presents with  . Follow-up    oxy refill    HPI  Patient is a 32 year old Caucasian female who is in today for followup. Overall she's doing well but does continue to struggle with chronic neck and back pain secondary to her previous surgeries. She has been compliant with her pain regimen but is in need of a refill. She's not had a recent bronchitis and sinusitis. No fevers or chills. No GI or GU complaints. Varicose through, palpitations, shortness of breath does acknowledge mildly increased anxiety secondary caring for her daughters young children as well as her own. Does not feel it is bad enough to attempt a daily medication at this point.  Past Medical History  Diagnosis Date  . Mental disorder   . Headache   . Preterm labor   . Fracture of spinal vertebra 2009    Surgery in 2011  . Seizures     Last seizure 2005  . Hemorrhoids thrombosed   . Anxiety   . Postpartum depression 11/27/2011  . PHARYNGITIS 10/13/2010    Qualifier: Diagnosis of  By: Abner Greenspan MD, Misty Stanley    . Acute bronchitis 10/13/2010    Qualifier: Diagnosis of  By: Abner Greenspan MD, Misty Stanley    . Anxiety and depression 11/27/2011  . Nausea & vomiting 07/10/2012    Past Surgical History  Procedure Laterality Date  . Cervical fusion  05/26/2011    She thinks it was C2    Family History  Problem Relation Age of Onset  . Arthritis Mother   . Coronary artery disease Father     s/p MIs/ smoker  . Alcohol abuse Father   . Heart attack Father   . Endometriosis Sister   . Aplastic anemia Maternal Grandmother   . Arthritis Maternal Grandmother     s/p hip replacement for arthritis  . Anemia Maternal Grandmother     aplastic  . Scoliosis Maternal Grandfather     s/p surgeries  . Endometriosis Sister   .  Cancer Sister     cervical  . Otitis media Daughter     History   Social History  . Marital Status: Single    Spouse Name: N/A    Number of Children: N/A  . Years of Education: N/A   Occupational History  . Not on file.   Social History Main Topics  . Smoking status: Current Every Day Smoker -- 0.50 packs/day for 17 years    Types: Cigarettes  . Smokeless tobacco: Never Used     Comment: e cigs  . Alcohol Use: No  . Drug Use: No  . Sexually Active: Yes     Comment: tubal   Other Topics Concern  . Not on file   Social History Narrative  . No narrative on file    Current Outpatient Prescriptions on File Prior to Visit  Medication Sig Dispense Refill  . acetaminophen (TYLENOL) 500 MG tablet Take 500-1,000 mg by mouth daily as needed. For pain       . promethazine (PHENERGAN) 25 MG tablet Take 1 tablet (25 mg total) by mouth every 8 (eight) hours as needed for nausea. For nausea.  30 tablet  1  . bifidobacterium infantis (ALIGN) capsule Take 1 capsule by mouth daily.      . [  DISCONTINUED] venlafaxine (EFFEXOR XR) 37.5 MG 24 hr capsule Take 1 capsule (37.5 mg total) by mouth daily.  30 capsule  1   No current facility-administered medications on file prior to visit.    Allergies  Allergen Reactions  . Cephalexin     Diarrhea, nausea, vomitting    Review of Systems  Review of Systems  Constitutional: Negative for fever and malaise/fatigue.  HENT: Negative for congestion.   Eyes: Negative for discharge.  Respiratory: Negative for shortness of breath.   Cardiovascular: Negative for chest pain, palpitations and leg swelling.  Gastrointestinal: Negative for nausea, abdominal pain and diarrhea.  Genitourinary: Negative for dysuria.  Musculoskeletal: Negative for falls.  Skin: Negative for rash.  Neurological: Negative for loss of consciousness and headaches.  Endo/Heme/Allergies: Negative for polydipsia.  Psychiatric/Behavioral: Negative for depression and suicidal  ideas. The patient is not nervous/anxious and does not have insomnia.     Objective  BP 128/72  Pulse 111  Temp(Src) 97.8 F (36.6 C) (Oral)  Ht 5\' 6"  (1.676 m)  Wt 126 lb 0.6 oz (57.171 kg)  BMI 20.35 kg/m2  SpO2 97%  LMP 01/10/2013  Breastfeeding? No  Physical Exam  Physical Exam  Constitutional: She is oriented to person, place, and time and well-developed, well-nourished, and in no distress. No distress.  HENT:  Head: Normocephalic and atraumatic.  Eyes: Conjunctivae are normal.  Neck: Neck supple. No thyromegaly present.  Cardiovascular: Normal rate, regular rhythm and normal heart sounds.   No murmur heard. Pulmonary/Chest: Effort normal and breath sounds normal. She has no wheezes.  Abdominal: She exhibits no distension and no mass.  Musculoskeletal: She exhibits no edema.  Lymphadenopathy:    She has no cervical adenopathy.  Neurological: She is alert and oriented to person, place, and time.  Skin: Skin is warm and dry. No rash noted. She is not diaphoretic.  Psychiatric: Memory, affect and judgment normal.    Lab Results  Component Value Date   TSH 1.218 11/21/2011   Lab Results  Component Value Date   WBC 9.2 11/21/2011   HGB 14.0 11/21/2011   HCT 41.8 11/21/2011   MCV 97.9 11/21/2011   PLT 420* 11/21/2011   Lab Results  Component Value Date   CREATININE 0.82 11/21/2011   BUN 17 11/21/2011   NA 140 11/21/2011   K 4.5 11/21/2011   CL 107 11/21/2011   CO2 23 11/21/2011   Lab Results  Component Value Date   ALT 11 11/21/2011   AST 13 11/21/2011   ALKPHOS 118* 11/21/2011   BILITOT 0.6 11/21/2011   Lab Results  Component Value Date   CHOL 140 09/13/2010   Lab Results  Component Value Date   HDL 46.10 09/13/2010   Lab Results  Component Value Date   LDLCALC 88 09/13/2010   Lab Results  Component Value Date   TRIG 29.0 09/13/2010   Lab Results  Component Value Date   CHOLHDL 3 09/13/2010     Assessment & Plan  TOBACCO ABUSE Unfortunately has  increased her smoking again, encouraged ongoing attempts at cessation  ARTHRITIS, CERVICAL SPINE Patient with ongoing neck and back pain, is doing relatively well on current dose of meds, acknowledges increased pain with increased lifting of kids but does not feel she needs any further meds. Given refill today  Anxiety and depression Patient is a acknowledging increased stressors but declines daily meds, may continue prn use of clonazepam at this time  SEIZURE DISORDER No seizure activity recently

## 2013-01-25 NOTE — Assessment & Plan Note (Signed)
Patient is a acknowledging increased stressors but declines daily meds, may continue prn use of clonazepam at this time

## 2013-01-25 NOTE — Assessment & Plan Note (Signed)
Unfortunately has increased her smoking again, encouraged ongoing attempts at cessation

## 2013-01-25 NOTE — Assessment & Plan Note (Signed)
No seizure activity recently

## 2013-02-19 ENCOUNTER — Other Ambulatory Visit: Payer: Self-pay | Admitting: Family Medicine

## 2013-02-19 DIAGNOSIS — R52 Pain, unspecified: Secondary | ICD-10-CM

## 2013-02-19 MED ORDER — OXYCODONE-ACETAMINOPHEN 10-325 MG PO TABS: 1.0000 | ORAL_TABLET | ORAL | Status: DC | PRN

## 2013-02-19 NOTE — Telephone Encounter (Signed)
Please advise if June is ok for physical and Oxycodone refill? Last RX was on 01-24-13 quantity 180 with 0 refill

## 2013-02-20 MED ORDER — OXYCODONE-ACETAMINOPHEN 10-325 MG PO TABS: 1.0000 | ORAL_TABLET | ORAL | Status: DC | PRN

## 2013-02-20 NOTE — Telephone Encounter (Signed)
Pt informed RX is ready for pick up.

## 2013-02-20 NOTE — Addendum Note (Signed)
Addended by: Court Joy on: 02/20/2013 08:50 AM   Modules accepted: Orders

## 2013-02-20 NOTE — Telephone Encounter (Signed)
Reprinted RX due to MD printing at [facility]. Trisha shredded that [employer]

## 2013-02-21 NOTE — Telephone Encounter (Signed)
Pt was advised RX was ready

## 2013-02-25 ENCOUNTER — Telehealth: Payer: Self-pay

## 2013-02-25 NOTE — Telephone Encounter (Signed)
Stop phenergan and Clonazepam and taper off the Percocet. If taking tid then drop to bid x 5-7 days then 1 daily x 5-7 days then stop til seen by [facility]

## 2013-02-25 NOTE — Telephone Encounter (Signed)
Pt informed

## 2013-02-25 NOTE — Telephone Encounter (Signed)
Patient left me a message stating that she just found out that she is pregnant again. Pt stated in the vm that she doesn't believe in abortions and will have the baby. Pt said that now this will put a "damper" on her getting her back surgery. Pt is wandering if she should stop all her medication or continue taking until she sees the OBGYN? Please advise?

## 2013-02-28 ENCOUNTER — Telehealth: Payer: Self-pay | Admitting: *Deleted

## 2013-03-01 ENCOUNTER — Encounter: Payer: Medicaid Other | Admitting: Obstetrics

## 2013-03-01 ENCOUNTER — Encounter: Payer: Self-pay | Admitting: Obstetrics

## 2013-03-01 NOTE — Telephone Encounter (Signed)
Error

## 2013-03-05 ENCOUNTER — Ambulatory Visit (INDEPENDENT_AMBULATORY_CARE_PROVIDER_SITE_OTHER): Payer: Medicaid Other

## 2013-03-05 VITALS — BP 101/68 | Temp 98.6°F | Wt 132.0 lb

## 2013-03-05 DIAGNOSIS — N760 Acute vaginitis: Secondary | ICD-10-CM

## 2013-03-05 DIAGNOSIS — Z3200 Encounter for pregnancy test, result unknown: Secondary | ICD-10-CM

## 2013-03-05 DIAGNOSIS — Z3201 Encounter for pregnancy test, result positive: Secondary | ICD-10-CM

## 2013-03-05 DIAGNOSIS — Z113 Encounter for screening for infections with a predominantly sexual mode of transmission: Secondary | ICD-10-CM

## 2013-03-05 DIAGNOSIS — Z3482 Encounter for supervision of other normal pregnancy, second trimester: Secondary | ICD-10-CM

## 2013-03-05 LAB — OB RESULTS CONSOLE GC/CHLAMYDIA: Gonorrhea: NEGATIVE

## 2013-03-05 LAB — POCT URINALYSIS DIPSTICK
Bilirubin, UA: NEGATIVE
Blood, UA: NEGATIVE
Ketones, UA: NEGATIVE
Spec Grav, UA: 1.01
pH, UA: 8

## 2013-03-05 MED ORDER — CITRANATAL ASSURE 35-1 & 300 MG PO MISC: 1.0000 | Freq: Every day | ORAL | Status: DC

## 2013-03-05 NOTE — Progress Notes (Unsigned)
Pulse-118   Subjective:    [name] [name] is being seen today for her first obstetrical visit.  This is not a planned pregnancy. She is at [redacted]w[redacted]d gestation. Her obstetrical history is significant for {ob risk factors:10154}. Relationship with FOB: not involved. Patient does not intend to breast feed. Pregnancy history fully reviewed.  Menstrual History: OB History   Grav Para Term Preterm Abortions TAB SAB Ect Mult Living   4 3 2 1      3       Menarche age: 88  Patient's last menstrual period was 10/12/2012.    The following portions of the patient's history were reviewed and updated as appropriate: allergies, current medications, past family history, past medical history, past social history, past surgical history and problem list.  Review of Systems {ros; complete:30496}    Objective:    {exam; complete:18323}    Assessment:    Pregnancy at [redacted]w[redacted]d weeks    Plan:    Initial labs drawn. Prenatal vitamins. Problem list reviewed and updated. AFP3 discussed: {requests/ordered/declines:14581}. Role of ultrasound in pregnancy discussed; fetal survey: {requests/ordered/declines:14581}. Amniocentesis discussed: {amniocentesis:14582}. Follow up in {numbers 0-4:31231} weeks. ***% of *** min visit spent on counseling and coordination of care.

## 2013-03-06 LAB — OBSTETRIC PANEL
Basophils Relative: 0 % (ref 0–1)
Eosinophils Absolute: 0.1 10*3/uL (ref 0.0–0.7)
Eosinophils Relative: 1 % (ref 0–5)
Hemoglobin: 11.2 g/dL — ABNORMAL LOW (ref 12.0–15.0)
Lymphs Abs: 2.3 10*3/uL (ref 0.7–4.0)
MCH: 31.5 pg (ref 26.0–34.0)
MCHC: 34.6 g/dL (ref 30.0–36.0)
MCV: 91 fL (ref 78.0–100.0)
Monocytes Relative: 5 % (ref 3–12)
Platelets: 286 10*3/uL (ref 150–400)
RBC: 3.56 MIL/uL — ABNORMAL LOW (ref 3.87–5.11)
Rh Type: POSITIVE

## 2013-03-06 LAB — GC/CHLAMYDIA PROBE AMP
CT Probe RNA: NEGATIVE
GC Probe RNA: NEGATIVE

## 2013-03-06 LAB — PAP IG W/ RFLX HPV ASCU

## 2013-03-06 LAB — AFP, QUAD SCREEN
HCG, Total: 16920 m[IU]/mL
Interpretation-AFP: NEGATIVE
MoM for AFP: 0.89
MoM for hCG: 1.09
Open Spina bifida: NEGATIVE
Osb Risk: 1:19700 {titer}
Tri 18 Scr Risk Est: NEGATIVE
uE3 Mom: 1.34
uE3 Value: 1.8 ng/mL

## 2013-03-06 LAB — WET PREP BY MOLECULAR PROBE
Gardnerella vaginalis: NEGATIVE
Trichomonas vaginosis: NEGATIVE

## 2013-03-06 LAB — HIV ANTIBODY (ROUTINE TESTING W REFLEX): HIV: NONREACTIVE

## 2013-03-08 LAB — HEMOGLOBINOPATHY EVALUATION
Hgb A2 Quant: 2.7 % (ref 2.2–3.2)
Hgb A: 97.3 % (ref 96.8–97.8)

## 2013-03-11 ENCOUNTER — Encounter: Payer: Self-pay | Admitting: Obstetrics

## 2013-03-11 ENCOUNTER — Ambulatory Visit (HOSPITAL_COMMUNITY)
Admission: RE | Admit: 2013-03-11 | Discharge: 2013-03-11 | Disposition: A | Discharge status: Home / Self Care | Payer: Medicaid Other | Source: Ambulatory Visit | Attending: Obstetrics | Admitting: Obstetrics

## 2013-03-11 ENCOUNTER — Other Ambulatory Visit: Payer: Self-pay | Admitting: Obstetrics

## 2013-03-11 DIAGNOSIS — Z3689 Encounter for other specified antenatal screening: Secondary | ICD-10-CM | POA: Insufficient documentation

## 2013-03-11 DIAGNOSIS — Z113 Encounter for screening for infections with a predominantly sexual mode of transmission: Secondary | ICD-10-CM

## 2013-03-11 DIAGNOSIS — Z8751 Personal history of pre-term labor: Secondary | ICD-10-CM | POA: Insufficient documentation

## 2013-03-11 DIAGNOSIS — O99330 Smoking (tobacco) complicating pregnancy, unspecified trimester: Secondary | ICD-10-CM | POA: Insufficient documentation

## 2013-03-11 LAB — US OB DETAIL + 14 WK

## 2013-04-02 ENCOUNTER — Other Ambulatory Visit: Payer: Medicaid Other

## 2013-04-02 ENCOUNTER — Ambulatory Visit (INDEPENDENT_AMBULATORY_CARE_PROVIDER_SITE_OTHER): Payer: Medicaid Other | Admitting: Obstetrics

## 2013-04-02 VITALS — BP 97/60 | Temp 98.0°F | Wt 135.2 lb

## 2013-04-02 DIAGNOSIS — Z3482 Encounter for supervision of other normal pregnancy, second trimester: Secondary | ICD-10-CM

## 2013-04-02 DIAGNOSIS — K219 Gastro-esophageal reflux disease without esophagitis: Secondary | ICD-10-CM | POA: Insufficient documentation

## 2013-04-02 DIAGNOSIS — Z3480 Encounter for supervision of other normal pregnancy, unspecified trimester: Secondary | ICD-10-CM

## 2013-04-02 DIAGNOSIS — G894 Chronic pain syndrome: Secondary | ICD-10-CM | POA: Insufficient documentation

## 2013-04-02 DIAGNOSIS — R112 Nausea with vomiting, unspecified: Secondary | ICD-10-CM

## 2013-04-02 MED ORDER — ONDANSETRON 4 MG PO TBDP: 4.0000 mg | ORAL_TABLET | Freq: Three times a day (TID) | ORAL | Status: DC | PRN

## 2013-04-02 MED ORDER — OMEPRAZOLE 20 MG PO CPDR: 20.0000 mg | DELAYED_RELEASE_CAPSULE | Freq: Every day | ORAL | Status: DC

## 2013-04-02 MED ORDER — OXYCODONE HCL 5 MG PO TABS: 10.0000 mg | ORAL_TABLET | Freq: Four times a day (QID) | ORAL | Status: DC | PRN

## 2013-04-02 NOTE — Progress Notes (Signed)
Pulse: 88

## 2013-04-03 LAB — CBC
HCT: 31.5 % — ABNORMAL LOW (ref 36.0–46.0)
Hemoglobin: 10.5 g/dL — ABNORMAL LOW (ref 12.0–15.0)
MCH: 31.4 pg (ref 26.0–34.0)
MCHC: 33.3 g/dL (ref 30.0–36.0)

## 2013-04-03 LAB — GLUCOSE TOLERANCE, 2 HOURS W/ 1HR
Glucose, 1 hour: 65 mg/dL — ABNORMAL LOW (ref 70–170)
Glucose, 2 hour: 69 mg/dL — ABNORMAL LOW (ref 70–139)
Glucose, Fasting: 57 mg/dL — ABNORMAL LOW (ref 70–99)

## 2013-04-16 ENCOUNTER — Encounter: Payer: Medicaid Other | Admitting: Obstetrics

## 2013-04-17 ENCOUNTER — Encounter: Payer: Self-pay | Admitting: Obstetrics & Gynecology

## 2013-04-22 ENCOUNTER — Telehealth: Payer: Self-pay | Admitting: *Deleted

## 2013-04-22 NOTE — Telephone Encounter (Signed)
Pt left message requesting to change from Oxycodone to Hydrocodone due to it being "too strong".   5:50 PM Spoke with patient, reports hx of "displaced narrowing" in back and is on "bed rest". Pt reports was on pain management contract prior to pregnancy. Pt c/o Oxycodone 5mg  causing "migraines" and has to take half Tylenol 500 mg, which makes here "groggy". Pt is requesting Hydrocodone for replacement. Pt has appointment April 23, 2013 @ 2:30 pm. Pt is [redacted]w[redacted]d.

## 2013-04-23 ENCOUNTER — Ambulatory Visit (INDEPENDENT_AMBULATORY_CARE_PROVIDER_SITE_OTHER): Payer: Medicaid Other | Admitting: Obstetrics

## 2013-04-23 VITALS — BP 109/71 | Temp 98.7°F | Wt 140.0 lb

## 2013-04-23 DIAGNOSIS — Z3482 Encounter for supervision of other normal pregnancy, second trimester: Secondary | ICD-10-CM

## 2013-04-23 DIAGNOSIS — Z3480 Encounter for supervision of other normal pregnancy, unspecified trimester: Secondary | ICD-10-CM

## 2013-04-23 DIAGNOSIS — O4403 Placenta previa specified as without hemorrhage, third trimester: Secondary | ICD-10-CM

## 2013-04-23 DIAGNOSIS — M549 Dorsalgia, unspecified: Secondary | ICD-10-CM

## 2013-04-23 DIAGNOSIS — O4400 Placenta previa specified as without hemorrhage, unspecified trimester: Secondary | ICD-10-CM | POA: Insufficient documentation

## 2013-04-23 MED ORDER — HYDROCODONE-ACETAMINOPHEN 10-325 MG PO TABS: 1.0000 | ORAL_TABLET | Freq: Three times a day (TID) | ORAL | Status: DC | PRN

## 2013-04-23 NOTE — Progress Notes (Signed)
Pulse- 114. Yellow abnormal discharge.

## 2013-04-23 NOTE — Telephone Encounter (Signed)
Per Dr. [name] -advised pt to bring all medications to office visit and will address pain management at that time. Pt expressed understanding.

## 2013-04-24 ENCOUNTER — Encounter: Payer: Self-pay | Admitting: Obstetrics

## 2013-04-24 ENCOUNTER — Ambulatory Visit (HOSPITAL_COMMUNITY)
Admission: RE | Admit: 2013-04-24 | Discharge: 2013-04-24 | Disposition: A | Discharge status: Home / Self Care | Payer: Medicaid Other | Source: Ambulatory Visit | Attending: Obstetrics | Admitting: Obstetrics

## 2013-04-24 ENCOUNTER — Other Ambulatory Visit: Payer: Self-pay | Admitting: Obstetrics

## 2013-04-24 DIAGNOSIS — O99330 Smoking (tobacco) complicating pregnancy, unspecified trimester: Secondary | ICD-10-CM | POA: Insufficient documentation

## 2013-04-24 DIAGNOSIS — M549 Dorsalgia, unspecified: Secondary | ICD-10-CM | POA: Insufficient documentation

## 2013-04-24 DIAGNOSIS — O4400 Placenta previa specified as without hemorrhage, unspecified trimester: Secondary | ICD-10-CM | POA: Insufficient documentation

## 2013-04-24 DIAGNOSIS — Z3689 Encounter for other specified antenatal screening: Secondary | ICD-10-CM | POA: Insufficient documentation

## 2013-04-24 DIAGNOSIS — O4403 Placenta previa specified as without hemorrhage, third trimester: Secondary | ICD-10-CM

## 2013-04-24 DIAGNOSIS — Z8751 Personal history of pre-term labor: Secondary | ICD-10-CM | POA: Insufficient documentation

## 2013-05-07 ENCOUNTER — Encounter: Payer: Medicaid Other | Admitting: Obstetrics

## 2013-05-16 ENCOUNTER — Encounter: Payer: Medicaid Other | Admitting: Obstetrics

## 2013-05-20 ENCOUNTER — Other Ambulatory Visit: Payer: Self-pay | Admitting: Obstetrics

## 2013-05-21 ENCOUNTER — Other Ambulatory Visit: Payer: Self-pay | Admitting: Obstetrics

## 2013-05-21 ENCOUNTER — Encounter: Payer: Medicaid Other | Admitting: Obstetrics

## 2013-05-21 NOTE — Telephone Encounter (Signed)
Please Review and address

## 2013-05-22 ENCOUNTER — Ambulatory Visit (INDEPENDENT_AMBULATORY_CARE_PROVIDER_SITE_OTHER): Payer: Medicaid Other | Admitting: Advanced Practice Midwife

## 2013-05-22 VITALS — BP 113/73 | Temp 98.2°F | Wt 145.0 lb

## 2013-05-22 DIAGNOSIS — B3731 Acute candidiasis of vulva and vagina: Secondary | ICD-10-CM

## 2013-05-22 DIAGNOSIS — B373 Candidiasis of vulva and vagina: Secondary | ICD-10-CM

## 2013-05-22 DIAGNOSIS — Z3483 Encounter for supervision of other normal pregnancy, third trimester: Secondary | ICD-10-CM

## 2013-05-22 DIAGNOSIS — Z3480 Encounter for supervision of other normal pregnancy, unspecified trimester: Secondary | ICD-10-CM

## 2013-05-22 LAB — POCT URINALYSIS DIPSTICK
Glucose, UA: NEGATIVE
Ketones, UA: NEGATIVE
Leukocytes, UA: NEGATIVE
Urobilinogen, UA: NEGATIVE

## 2013-05-22 MED ORDER — FLUCONAZOLE 150 MG PO TABS: 150.0000 mg | ORAL_TABLET | Freq: Once | ORAL | Status: DC

## 2013-05-22 NOTE — Progress Notes (Signed)
Pt states she had a stomach virus about 4 days ago. Pt states she had an increased amount of nausea and vomiting. Pt states she was feeling some contractions during the time that she was sick. Pt states she is having thick white discharge

## 2013-05-22 NOTE — Progress Notes (Signed)
Subjective:    [name] [name] is a 32 y.o. female being seen today for her obstetrical visit. She is at [redacted]w[redacted]d gestation. Patient reports vaginal irritation. Fetal movement: normal.  Had viral stomach infection last week and experienced intense contractions, reports current resolution.  Menstrual History: OB History   Grav Para Term Preterm Abortions TAB SAB Ect Mult Living   4 3 2 1      3        Patient's last menstrual period was 10/12/2012.    The following portions of the patient's history were reviewed and updated as appropriate: allergies, current medications and problem list.  Review of Systems Gastrointestinal: negative Genitourinary:positive for vaginal discharge , irritation  Objective:    BP 113/73  Temp(Src) 98.2 F (36.8 C)  Wt 145 lb (65.772 kg)  BMI 23.41 kg/m2  LMP 10/12/2012 FHT:  150 BPM  Uterine Size: 31 cm  Presentation: cephalic    Microscopic wet-mount exam shows hyphae.  Cervix 1, long, high  Assessment:    Pregnancy 31 and 5/7 weeks  Yeast Infection See problem list  Plan:    28-week labs reviewed, normal Reviewed [place] and resolution of placenta previa. Follow up in 2 Weeks. Declined filling hydrocodone Rx today, instructed patient Dr. [name] has already been sent the request.  Diflucan sent to pharmacy. Patient to use refills for zofran.    Visit time 20 min, 90% spent in counseling and coordination of care.  Yazhini Mcaulay Wilson Singer CNM

## 2013-05-27 ENCOUNTER — Encounter: Payer: Medicaid Other | Admitting: Obstetrics

## 2013-06-05 ENCOUNTER — Ambulatory Visit (INDEPENDENT_AMBULATORY_CARE_PROVIDER_SITE_OTHER): Payer: Medicaid Other | Admitting: Obstetrics

## 2013-06-05 ENCOUNTER — Encounter: Payer: Self-pay | Admitting: Obstetrics

## 2013-06-05 VITALS — BP 113/64 | Temp 97.6°F | Wt 150.8 lb

## 2013-06-05 DIAGNOSIS — Z3480 Encounter for supervision of other normal pregnancy, unspecified trimester: Secondary | ICD-10-CM

## 2013-06-05 DIAGNOSIS — O09299 Supervision of pregnancy with other poor reproductive or obstetric history, unspecified trimester: Secondary | ICD-10-CM

## 2013-06-05 DIAGNOSIS — Z3483 Encounter for supervision of other normal pregnancy, third trimester: Secondary | ICD-10-CM

## 2013-06-05 LAB — POCT URINALYSIS DIPSTICK
Leukocytes, UA: NEGATIVE
Nitrite, UA: NEGATIVE
pH, UA: 5

## 2013-06-05 NOTE — Progress Notes (Signed)
Pulse-87  No complaints  

## 2013-06-07 ENCOUNTER — Other Ambulatory Visit: Payer: Self-pay | Admitting: Obstetrics

## 2013-06-07 DIAGNOSIS — G894 Chronic pain syndrome: Secondary | ICD-10-CM

## 2013-06-10 ENCOUNTER — Encounter: Payer: Self-pay | Admitting: Obstetrics

## 2013-06-10 ENCOUNTER — Ambulatory Visit (HOSPITAL_COMMUNITY): Admission: RE | Admit: 2013-06-10 | Payer: Medicaid Other | Source: Ambulatory Visit

## 2013-06-21 ENCOUNTER — Other Ambulatory Visit: Payer: Self-pay | Admitting: Obstetrics

## 2013-06-25 ENCOUNTER — Inpatient Hospital Stay (HOSPITAL_COMMUNITY)
Admission: AD | Admit: 2013-06-25 | Discharge: 2013-06-25 | Disposition: A | Discharge status: Home / Self Care | Payer: Medicaid Other | Source: Ambulatory Visit | Attending: Obstetrics | Admitting: Obstetrics

## 2013-06-25 ENCOUNTER — Encounter (HOSPITAL_COMMUNITY): Payer: Self-pay

## 2013-06-25 DIAGNOSIS — R51 Headache: Secondary | ICD-10-CM | POA: Insufficient documentation

## 2013-06-25 DIAGNOSIS — O4700 False labor before 37 completed weeks of gestation, unspecified trimester: Secondary | ICD-10-CM | POA: Insufficient documentation

## 2013-06-25 DIAGNOSIS — O479 False labor, unspecified: Secondary | ICD-10-CM

## 2013-06-25 DIAGNOSIS — O471 False labor at or after 37 completed weeks of gestation: Secondary | ICD-10-CM

## 2013-06-25 MED ORDER — ZOLPIDEM TARTRATE 5 MG PO TABS
5.0000 mg | ORAL_TABLET | Freq: Once | ORAL | Status: DC
  Filled 2013-06-25: qty 1

## 2013-06-25 NOTE — MAU Provider Note (Signed)
History     CSN: [id number]  Arrival date and time: 06/25/13 1846   None     Chief Complaint  Patient presents with  . Headache  . Contractions   Headache     [name] [name] is a 32 y.o. [id number] at [redacted]w[redacted]d who presents today with contractions. She states that she has been having contractions for about 2 weeks. She does not have an appointment to be seen in the future, and has not been seen in about 2 weeks at the office because she "can't stand up for longer than 15 mins". She states that when she has contractions it is difficult to breathe and she feels her heart rate increasing.    She states that she was induced at 37 weeks with her last pregnancy because she has degenerative disk disease and could not walk and she was also having some bleeding. She denies any bleeding or LOF and confirms fetal movement at this time.  Past Medical History  Diagnosis Date  . Mental disorder   . Headache(784.0)   . Preterm labor   . Fracture of spinal vertebra 2009    Surgery in 2011  . Seizures     Last seizure 2005  . Hemorrhoids thrombosed   . Anxiety   . Postpartum depression 11/27/2011  . PHARYNGITIS 10/13/2010    Qualifier: Diagnosis of  By: Abner Greenspan MD, Misty Stanley    . Acute bronchitis 10/13/2010    Qualifier: Diagnosis of  By: Abner Greenspan MD, Misty Stanley    . Anxiety and depression 11/27/2011  . Nausea & vomiting 07/10/2012    Past Surgical History  Procedure Laterality Date  . Cervical fusion  05/26/2011    She thinks it was C2    Family History  Problem Relation Age of Onset  . Arthritis Mother   . Coronary artery disease Father     s/p MIs/ smoker  . Alcohol abuse Father   . Heart attack Father   . Endometriosis Sister   . Aplastic anemia Maternal Grandmother   . Arthritis Maternal Grandmother     s/p hip replacement for arthritis  . Anemia Maternal Grandmother     aplastic  . Scoliosis Maternal Grandfather     s/p surgeries  . Endometriosis Sister   . Cancer Sister     cervical   . Otitis media Daughter     History  Substance Use Topics  . Smoking status: Current Every Day Smoker -- 0.50 packs/day for 17 years    Types: Cigarettes  . Smokeless tobacco: Never Used     Comment: e cigs  . Alcohol Use: No    Allergies:  Allergies  Allergen Reactions  . Cephalexin     Diarrhea, nausea, vomitting    Prescriptions prior to admission  Medication Sig Dispense Refill  . acetaminophen (TYLENOL) 500 MG tablet Take 500-1,000 mg by mouth daily as needed. For pain       . Docosahexaenoic Acid (PRENATAL DHA PO) Take 1 tablet by mouth daily.       Marland Kitchen omeprazole (PRILOSEC) 20 MG capsule Take 1 capsule (20 mg total) by mouth daily.  60 capsule  5  . ondansetron (ZOFRAN ODT) 4 MG disintegrating tablet Take 1 tablet (4 mg total) by mouth every 8 (eight) hours as needed for nausea.  40 tablet  5    Review of Systems  Neurological: Positive for headaches.   Physical Exam   Blood pressure 128/74, pulse 104, temperature 98.8 F (37.1 C), temperature source  Oral, resp. rate 18, last menstrual period 10/12/2012.  Physical Exam  Nursing note and vitals reviewed. Constitutional: She is oriented to person, place, and time. She appears well-developed and well-nourished. No distress.  Cardiovascular: Normal rate.   Respiratory: Effort normal.  GI: Soft. There is no tenderness.  Neurological: She is alert and oriented to person, place, and time.  Skin: Skin is warm and dry.  Psychiatric: She has a normal mood and affect.   NST: 140, moderate with 15x15 accels, no decels Toco: UI with occasional UC  MAU Course  Procedures   2045: RN spoke with Dr. Gaynell Face. Patient will be dc home with ambien. Call for an appointment to be seen in the office tomorrow.   Assessment and Plan   1. False labor after 37 weeks of gestation without delivery    Labor precautions Fetal kick counts FU with the office tomorrow   Tawnya Crook 06/25/2013, 8:20 PM

## 2013-06-25 NOTE — MAU Note (Signed)
Pt states uc are making her head hurt and her face feel like "her face is on fire" Has been going on for 2 days. States good FM. Increase in vaginal discharge, but no vaginal bleeding.

## 2013-06-26 ENCOUNTER — Ambulatory Visit (INDEPENDENT_AMBULATORY_CARE_PROVIDER_SITE_OTHER): Payer: Medicaid Other | Admitting: Obstetrics

## 2013-06-26 ENCOUNTER — Encounter: Payer: Self-pay | Admitting: Obstetrics

## 2013-06-26 ENCOUNTER — Other Ambulatory Visit: Payer: Self-pay | Admitting: Obstetrics

## 2013-06-26 VITALS — BP 127/75

## 2013-06-26 DIAGNOSIS — R52 Pain, unspecified: Secondary | ICD-10-CM

## 2013-06-26 DIAGNOSIS — Z3483 Encounter for supervision of other normal pregnancy, third trimester: Secondary | ICD-10-CM

## 2013-06-26 DIAGNOSIS — Z3480 Encounter for supervision of other normal pregnancy, unspecified trimester: Secondary | ICD-10-CM

## 2013-06-26 MED ORDER — OXYCODONE-ACETAMINOPHEN 10-325 MG PO TABS: 1.0000 | ORAL_TABLET | ORAL | Status: DC | PRN

## 2013-06-26 NOTE — Progress Notes (Signed)
Pulse- 91.  Contractions about three an hour.

## 2013-06-27 ENCOUNTER — Telehealth (HOSPITAL_COMMUNITY): Payer: Self-pay | Admitting: *Deleted

## 2013-06-27 ENCOUNTER — Encounter (HOSPITAL_COMMUNITY): Payer: Self-pay | Admitting: *Deleted

## 2013-06-27 ENCOUNTER — Encounter: Payer: Self-pay | Admitting: Obstetrics

## 2013-06-27 NOTE — Telephone Encounter (Signed)
Preadmission screen  

## 2013-07-02 ENCOUNTER — Inpatient Hospital Stay (HOSPITAL_COMMUNITY)
Admission: RE | Admit: 2013-07-02 | Discharge: 2013-07-04 | DRG: 775 | Disposition: A | Discharge status: Home / Self Care | Payer: Medicaid Other | Source: Ambulatory Visit | Attending: Obstetrics | Admitting: Obstetrics

## 2013-07-02 ENCOUNTER — Inpatient Hospital Stay (HOSPITAL_COMMUNITY): Payer: Medicaid Other | Admitting: Anesthesiology

## 2013-07-02 ENCOUNTER — Encounter (HOSPITAL_COMMUNITY): Payer: Self-pay

## 2013-07-02 ENCOUNTER — Encounter (HOSPITAL_COMMUNITY): Payer: Self-pay | Admitting: Anesthesiology

## 2013-07-02 DIAGNOSIS — O99892 Other specified diseases and conditions complicating childbirth: Principal | ICD-10-CM | POA: Diagnosis present

## 2013-07-02 DIAGNOSIS — M549 Dorsalgia, unspecified: Secondary | ICD-10-CM | POA: Diagnosis present

## 2013-07-02 LAB — CBC
MCHC: 34.2 g/dL (ref 30.0–36.0)
Platelets: 237 10*3/uL (ref 150–400)
RDW: 13.5 % (ref 11.5–15.5)

## 2013-07-02 LAB — RPR: RPR Ser Ql: NONREACTIVE

## 2013-07-02 LAB — RAPID URINE DRUG SCREEN, HOSP PERFORMED
Barbiturates: NOT DETECTED
Benzodiazepines: NOT DETECTED
Cocaine: NOT DETECTED
Tetrahydrocannabinol: NOT DETECTED

## 2013-07-02 MED ORDER — ZOLPIDEM TARTRATE 5 MG PO TABS: 5.0000 mg | ORAL_TABLET | Freq: Every evening | ORAL | Status: DC | PRN

## 2013-07-02 MED ORDER — OXYCODONE-ACETAMINOPHEN 5-325 MG PO TABS
1.0000 | ORAL_TABLET | ORAL | Status: DC | PRN
  Administered 2013-07-02 (×2): 1 via ORAL
  Administered 2013-07-03 – 2013-07-04 (×6): 2 via ORAL
  Filled 2013-07-02: qty 1
  Filled 2013-07-02 (×6): qty 2
  Filled 2013-07-02: qty 1

## 2013-07-02 MED ORDER — OXYTOCIN 40 UNITS IN LACTATED RINGERS INFUSION - SIMPLE MED: 62.5000 mL/h | INTRAVENOUS | Status: DC

## 2013-07-02 MED ORDER — WITCH HAZEL-GLYCERIN EX PADS: 1.0000 "application " | MEDICATED_PAD | CUTANEOUS | Status: DC | PRN

## 2013-07-02 MED ORDER — FERROUS SULFATE 325 (65 FE) MG PO TABS
325.0000 mg | ORAL_TABLET | Freq: Two times a day (BID) | ORAL | Status: DC
  Administered 2013-07-03 – 2013-07-04 (×3): 325 mg via ORAL
  Filled 2013-07-02 (×3): qty 1

## 2013-07-02 MED ORDER — ACETAMINOPHEN 325 MG PO TABS: 650.0000 mg | ORAL_TABLET | ORAL | Status: DC | PRN

## 2013-07-02 MED ORDER — TETANUS-DIPHTH-ACELL PERTUSSIS 5-2.5-18.5 LF-MCG/0.5 IM SUSP: 0.5000 mL | Freq: Once | INTRAMUSCULAR | Status: DC

## 2013-07-02 MED ORDER — NALBUPHINE HCL 10 MG/ML IJ SOLN
10.0000 mg | INTRAMUSCULAR | Status: DC | PRN
  Filled 2013-07-02: qty 1

## 2013-07-02 MED ORDER — OXYTOCIN BOLUS FROM INFUSION: 500.0000 mL | INTRAVENOUS | Status: DC

## 2013-07-02 MED ORDER — DIPHENHYDRAMINE HCL 50 MG/ML IJ SOLN: 12.5000 mg | INTRAMUSCULAR | Status: DC | PRN

## 2013-07-02 MED ORDER — ONDANSETRON HCL 4 MG PO TABS
4.0000 mg | ORAL_TABLET | ORAL | Status: DC | PRN
  Administered 2013-07-02: 4 mg via ORAL
  Filled 2013-07-02: qty 1

## 2013-07-02 MED ORDER — CITRIC ACID-SODIUM CITRATE 334-500 MG/5ML PO SOLN: 30.0000 mL | ORAL | Status: DC | PRN

## 2013-07-02 MED ORDER — BENZOCAINE-MENTHOL 20-0.5 % EX AERO
1.0000 "application " | INHALATION_SPRAY | CUTANEOUS | Status: DC | PRN
  Administered 2013-07-03: 1 via TOPICAL
  Filled 2013-07-02: qty 56

## 2013-07-02 MED ORDER — PHENYLEPHRINE 40 MCG/ML (10ML) SYRINGE FOR IV PUSH (FOR BLOOD PRESSURE SUPPORT)
80.0000 ug | PREFILLED_SYRINGE | INTRAVENOUS | Status: DC | PRN
  Filled 2013-07-02: qty 2

## 2013-07-02 MED ORDER — FENTANYL 2.5 MCG/ML BUPIVACAINE 1/10 % EPIDURAL INFUSION (WH - ANES)
14.0000 mL/h | INTRAMUSCULAR | Status: DC | PRN
  Administered 2013-07-02: 14 mL/h via EPIDURAL
  Filled 2013-07-02: qty 125

## 2013-07-02 MED ORDER — ONDANSETRON HCL 4 MG/2ML IJ SOLN: 4.0000 mg | INTRAMUSCULAR | Status: DC | PRN

## 2013-07-02 MED ORDER — OXYTOCIN 40 UNITS IN LACTATED RINGERS INFUSION - SIMPLE MED
1.0000 m[IU]/min | INTRAVENOUS | Status: DC
  Administered 2013-07-02: 2 m[IU]/min via INTRAVENOUS
  Filled 2013-07-02: qty 1000

## 2013-07-02 MED ORDER — IBUPROFEN 600 MG PO TABS
600.0000 mg | ORAL_TABLET | Freq: Four times a day (QID) | ORAL | Status: DC | PRN
  Administered 2013-07-03: 600 mg via ORAL
  Filled 2013-07-02 (×3): qty 1

## 2013-07-02 MED ORDER — SIMETHICONE 80 MG PO CHEW: 80.0000 mg | CHEWABLE_TABLET | ORAL | Status: DC | PRN

## 2013-07-02 MED ORDER — DIBUCAINE 1 % RE OINT: 1.0000 "application " | TOPICAL_OINTMENT | RECTAL | Status: DC | PRN

## 2013-07-02 MED ORDER — DIPHENHYDRAMINE HCL 25 MG PO CAPS: 25.0000 mg | ORAL_CAPSULE | Freq: Four times a day (QID) | ORAL | Status: DC | PRN

## 2013-07-02 MED ORDER — EPHEDRINE 5 MG/ML INJ
10.0000 mg | INTRAVENOUS | Status: DC | PRN
  Filled 2013-07-02: qty 4
  Filled 2013-07-02: qty 2

## 2013-07-02 MED ORDER — TERBUTALINE SULFATE 1 MG/ML IJ SOLN: 0.2500 mg | Freq: Once | INTRAMUSCULAR | Status: DC | PRN

## 2013-07-02 MED ORDER — SENNOSIDES-DOCUSATE SODIUM 8.6-50 MG PO TABS
2.0000 | ORAL_TABLET | ORAL | Status: DC
  Administered 2013-07-04: 2 via ORAL

## 2013-07-02 MED ORDER — PRENATAL MULTIVITAMIN CH
1.0000 | ORAL_TABLET | Freq: Every day | ORAL | Status: DC
  Administered 2013-07-03: 1 via ORAL
  Filled 2013-07-02: qty 1

## 2013-07-02 MED ORDER — OXYCODONE-ACETAMINOPHEN 5-325 MG PO TABS: 1.0000 | ORAL_TABLET | ORAL | Status: DC | PRN

## 2013-07-02 MED ORDER — LACTATED RINGERS IV SOLN: 500.0000 mL | Freq: Once | INTRAVENOUS | Status: DC

## 2013-07-02 MED ORDER — LACTATED RINGERS IV SOLN
INTRAVENOUS | Status: DC
  Administered 2013-07-02 (×3): via INTRAVENOUS

## 2013-07-02 MED ORDER — PROMETHAZINE HCL 25 MG/ML IJ SOLN: 25.0000 mg | Freq: Four times a day (QID) | INTRAMUSCULAR | Status: DC | PRN

## 2013-07-02 MED ORDER — PHENYLEPHRINE 40 MCG/ML (10ML) SYRINGE FOR IV PUSH (FOR BLOOD PRESSURE SUPPORT)
80.0000 ug | PREFILLED_SYRINGE | INTRAVENOUS | Status: DC | PRN
  Filled 2013-07-02: qty 2
  Filled 2013-07-02: qty 5

## 2013-07-02 MED ORDER — LIDOCAINE HCL (PF) 1 % IJ SOLN
30.0000 mL | INTRAMUSCULAR | Status: DC | PRN
  Filled 2013-07-02: qty 30

## 2013-07-02 MED ORDER — LACTATED RINGERS IV SOLN: 500.0000 mL | INTRAVENOUS | Status: DC | PRN

## 2013-07-02 MED ORDER — FLEET ENEMA 7-19 GM/118ML RE ENEM: 1.0000 | ENEMA | RECTAL | Status: DC | PRN

## 2013-07-02 MED ORDER — IBUPROFEN 600 MG PO TABS
600.0000 mg | ORAL_TABLET | Freq: Four times a day (QID) | ORAL | Status: DC
  Administered 2013-07-02 – 2013-07-04 (×5): 600 mg via ORAL
  Filled 2013-07-02 (×3): qty 1

## 2013-07-02 MED ORDER — ONDANSETRON HCL 4 MG/2ML IJ SOLN: 4.0000 mg | Freq: Four times a day (QID) | INTRAMUSCULAR | Status: DC | PRN

## 2013-07-02 MED ORDER — LOPERAMIDE HCL 2 MG PO CAPS
2.0000 mg | ORAL_CAPSULE | ORAL | Status: DC | PRN
  Administered 2013-07-03: 2 mg via ORAL
  Filled 2013-07-02: qty 1

## 2013-07-02 MED ORDER — LIDOCAINE HCL (PF) 1 % IJ SOLN
INTRAMUSCULAR | Status: DC | PRN
  Administered 2013-07-02 (×2): 5 mL

## 2013-07-02 MED ORDER — EPHEDRINE 5 MG/ML INJ
10.0000 mg | INTRAVENOUS | Status: DC | PRN
  Filled 2013-07-02: qty 2

## 2013-07-02 MED ORDER — LANOLIN HYDROUS EX OINT: TOPICAL_OINTMENT | CUTANEOUS | Status: DC | PRN

## 2013-07-02 NOTE — Anesthesia Preprocedure Evaluation (Signed)
Anesthesia Evaluation  Patient identified by MRN, date of birth, ID band Patient awake    Reviewed: Allergy & Precautions, H&P , Patient's Chart, lab work & pertinent test results  Airway Mallampati: II TM Distance: >3 FB Neck ROM: full    Dental no notable dental hx.    Pulmonary neg pulmonary ROS,  breath sounds clear to auscultation  Pulmonary exam normal       Cardiovascular negative cardio ROS  Rhythm:regular Rate:Normal     Neuro/Psych  Headaches, Seizures -,  PSYCHIATRIC DISORDERS Anxiety Depression  Neuromuscular disease negative neurological ROS  negative psych ROS   GI/Hepatic negative GI ROS, Neg liver ROS, GERD-  ,  Endo/Other  negative endocrine ROS  Renal/GU negative Renal ROS     Musculoskeletal   Abdominal   Peds  Hematology negative hematology ROS (+)   Anesthesia Other Findings     Fracture of spinal vertebra 2009 Surgery in 2011    Seizures   Last seizure 2005   Anxiety and depression Nausea & vomiting    Reproductive/Obstetrics (+) Pregnancy                           Anesthesia Physical Anesthesia Plan  ASA: II  Anesthesia Plan: Epidural   Post-op Pain Management:    Induction:   Airway Management Planned:   Additional Equipment:   Intra-op Plan:   Post-operative Plan:   Informed Consent: I have reviewed the patients History and Physical, chart, labs and discussed the procedure including the risks, benefits and alternatives for the proposed anesthesia with the patient or authorized representative who has indicated his/her understanding and acceptance.     Plan Discussed with:   Anesthesia Plan Comments:         Anesthesia Quick Evaluation

## 2013-07-02 NOTE — Anesthesia Procedure Notes (Signed)
Epidural Patient location during procedure: OB Start time: 07/02/2013 2:42 PM  Staffing Anesthesiologist: Angus Seller., Harrell Gave. Performed by: anesthesiologist   Preanesthetic Checklist Completed: patient identified, site marked, surgical consent, pre-op evaluation, timeout performed, IV checked, risks and benefits discussed and monitors and equipment checked  Epidural Patient position: sitting Prep: site prepped and draped and DuraPrep Patient monitoring: continuous pulse ox and blood pressure Approach: midline Injection technique: LOR air  Needle:  Needle type: Tuohy  Needle gauge: 17 G Needle length: 9 cm and 9 Needle insertion depth: 5 cm cm Catheter type: closed end flexible Catheter size: 19 Gauge Catheter at skin depth: 10 cm Test dose: negative  Assessment Events: blood not aspirated, injection not painful, no injection resistance, negative IV test and no paresthesia  Additional Notes Patient identified.  Risk benefits discussed including failed block, incomplete pain control, headache, nerve damage, paralysis, blood pressure changes, nausea, vomiting, reactions to medication both toxic or allergic, and postpartum back pain.  Patient expressed understanding and wished to proceed.  All questions were answered.  Sterile technique used throughout procedure and epidural site dressed with sterile barrier dressing. No paresthesia or other complications noted.The patient did not experience any signs of intravascular injection such as tinnitus or metallic taste in mouth nor signs of intrathecal spread such as rapid motor block. Please see nursing notes for vital signs.

## 2013-07-02 NOTE — Progress Notes (Addendum)
Pt epidural not showing in epic as being done. Req Dr.Jackson chart it. He investigated states it shows up as placed in his notes. Unable to get it to show up in pts chart. Epidural pulled tip intact.

## 2013-07-02 NOTE — Progress Notes (Signed)
Remell ALAIYAH BOLLMAN is a 32 y.o. [id number] at [redacted]w[redacted]d by ultrasound (dated by a 23 week [place]) admitted for induction of labor due to Elective at term.  Subjective: Currently coping well. Plans CLE.   Objective: BP 121/57  Pulse 80  Temp(Src) 98.2 F (36.8 C) (Oral)  Resp 18  Ht 5\' 6"  (1.676 m)  Wt 150 lb (68.04 kg)  BMI 24.22 kg/m2  SpO2 96%  LMP 10/12/2012      FHT:  FHR: 140 bpm, variability: absent,  accelerations:  Present,  decelerations:  Absent UC:   regular, every 3 minutes SVE:  4/80/-2 ballotable, AROM deferred at this time.  Labs: Lab Results  Component Value Date   WBC 11.5* 07/02/2013   HGB 11.5* 07/02/2013   HCT 33.6* 07/02/2013   MCV 91.8 07/02/2013   PLT 237 07/02/2013    Assessment / Plan: Induction of labor due to Term, chronic pain issues,  progressing well on pitocin Placenta previa resolved.  Labor: Progressing normally and Progressing on Pitocin, will continue to increase then AROM Preeclampsia:  no signs or symptoms of toxicity Fetal Wellbeing:  Category I Pain Control:  Labor support without medications I/D:  n/a Anticipated MOD:  NSVD  Boris Engelmann 07/02/2013, 3:34 PM

## 2013-07-02 NOTE — H&P (Signed)
[name] [name] is a 32 y.o. female presenting for IOL. Maternal Medical History:  Reason for admission: 32 yo G4 P2103,  EDC 07-02-13.  Presents for elective IOL. Multiparity.  Severe back pain.  Favorable cervix.  Fetal activity: Perceived fetal activity is normal.   Last perceived fetal movement was within the past hour.    Prenatal complications: no prenatal complications Prenatal Complications - Diabetes: none.    OB History   Grav Para Term Preterm Abortions TAB SAB Ect Mult Living   4 3 2 1      3      Past Medical History  Diagnosis Date  . Mental disorder   . Headache(784.0)   . Preterm labor   . Fracture of spinal vertebra 2009    Surgery in 2011  . Seizures     Last seizure 2005  . Hemorrhoids thrombosed   . Anxiety   . Postpartum depression 11/27/2011  . PHARYNGITIS 10/13/2010    Qualifier: Diagnosis of  By: Abner Greenspan MD, Misty Stanley    . Acute bronchitis 10/13/2010    Qualifier: Diagnosis of  By: Abner Greenspan MD, Misty Stanley    . Anxiety and depression 11/27/2011  . Nausea & vomiting 07/10/2012   Past Surgical History  Procedure Laterality Date  . Cervical fusion  05/26/2011    She thinks it was C2   Family History: family history includes Alcohol abuse in her father; Anemia in her maternal grandmother; Aplastic anemia in her maternal grandmother; Arthritis in her maternal grandmother and mother; COPD in her maternal grandmother; Cancer in her sister; Coronary artery disease in her father; Endometriosis in her sister and sister; Heart attack in her father; Heart disease in her maternal grandmother; Hypertension in her mother and sister; Otitis media in her daughter; Scoliosis in her maternal grandfather. Social History:  reports that she has been smoking Cigarettes.  She has a 8.5 pack-year smoking history. She has never used smokeless tobacco. She reports that she does not drink alcohol or use illicit drugs.   Prenatal Transfer Tool  Maternal Diabetes: No Genetic Screening:  Normal Maternal Ultrasounds/Referrals: Normal Fetal Ultrasounds or other Referrals:  None Maternal Substance Abuse:  No Significant Maternal Medications:  None Significant Maternal Lab Results:  None Other Comments:  None  Review of Systems  Musculoskeletal: Positive for myalgias and back pain.  All other systems reviewed and are negative.    Dilation: 4 Effacement (%): 60 Station: -2 Exam by:: J.Cox, RN Blood pressure 114/66, pulse 90, temperature 98.2 F (36.8 C), temperature source Oral, resp. rate 18, height 5\' 6"  (1.676 m), weight 150 lb (68.04 kg), last menstrual period 10/12/2012. Maternal Exam:  Abdomen: Patient reports no abdominal tenderness. Fetal presentation: vertex     Physical Exam  Nursing note and vitals reviewed. Constitutional: She is oriented to person, place, and time. She appears well-developed and well-nourished.  HENT:  Head: Normocephalic and atraumatic.  Eyes: Conjunctivae are normal. Pupils are equal, round, and reactive to light.  Neck: Normal range of motion. Neck supple.  Cardiovascular: Normal rate and regular rhythm.   Respiratory: Effort normal.  GI: Soft.  Musculoskeletal: Normal range of motion.  Neurological: She is alert and oriented to person, place, and time.  Skin: Skin is warm and dry.  Psychiatric: She has a normal mood and affect. Her behavior is normal. Judgment and thought content normal.    Prenatal labs: ABO, Rh: O/POS/-- (05/13 1638) Antibody: NEG (05/13 1638) Rubella: 3.71 (05/13 1638) RPR: NON REAC (06/10 0926)  HBsAg:  NEGATIVE (05/13 1638)  HIV: NON REACTIVE (06/10 0926)  GBS:     Assessment/Plan: 40 weeks.  Multiparity.  Severe back pain.  Favorable cervix.  Elective IOL.  Low dose pitocin per protocol.   Esmirna Ravan A 07/02/2013, 8:36 AM

## 2013-07-03 ENCOUNTER — Encounter: Payer: Medicaid Other | Admitting: Obstetrics

## 2013-07-03 ENCOUNTER — Encounter (HOSPITAL_COMMUNITY): Payer: Self-pay

## 2013-07-03 LAB — CBC
HCT: 29.5 % — ABNORMAL LOW (ref 36.0–46.0)
MCHC: 34.6 g/dL (ref 30.0–36.0)
MCV: 92.5 fL (ref 78.0–100.0)
Platelets: 205 10*3/uL (ref 150–400)
RDW: 13.5 % (ref 11.5–15.5)
WBC: 10.3 10*3/uL (ref 4.0–10.5)

## 2013-07-03 NOTE — Progress Notes (Signed)
Post Partum Day 1 Subjective: no complaints  Objective: Blood pressure 117/74, pulse 56, temperature 97.6 F (36.4 C), temperature source Oral, resp. rate 16, height 5\' 6"  (1.676 m), weight 150 lb (68.04 kg), last menstrual period 10/12/2012, SpO2 100.00%, unknown if currently breastfeeding.  Physical Exam:  General: alert and no distress Lochia: appropriate Uterine Fundus: firm Incision: none DVT Evaluation: No evidence of DVT seen on physical exam.   Recent Labs  07/02/13 0801 07/03/13 0550  HGB 11.5* 10.2*  HCT 33.6* 29.5*    Assessment/Plan: Plan for discharge tomorrow   LOS: 1 day   HARPER,CHARLES A 07/03/2013, 8:50 AM

## 2013-07-03 NOTE — Clinical Social Work Maternal (Signed)
        Clinical Social Work Department PSYCHOSOCIAL ASSESSMENT - MATERNAL/CHILD 07/03/2013  Patient:  [name] [name], [name] [name]  Account Number:  [id number]  Admit Date:  07/02/2013  Marjo Bicker Name:   [name] [name]    Clinical Social Worker:  [name] Putnam, LCSW   Date/Time:  07/03/2013 11:55 AM  Date Referred:  07/03/2013   Referral source  CN     Referred reason  Behavioral Health Issues  Substance Abuse   Other referral source:    I:  FAMILY / HOME ENVIRONMENT Child's legal guardian:  PARENT  Guardian - Name Guardian - Age Guardian - Address  [name] [name] 32 [address]; [place], [place] [address]  [name] [name] 30    Other household support members/support persons Name Relationship DOB   MOTHER    BROTHER    DAUGHTER 09/11/00   DAUGHTER 06/16/09   SON 11/11/11   NEPHEW 64 year old   NEPHEW 67 year old   Other support:   Pt's Grandfather    II  PSYCHOSOCIAL DATA Information Source:  Patient Interview  Event organiser Employment:   Surveyor, quantity resources:  [employer] If Medicaid - County:  [place] Other  Sales executive  WIC   School / Grade:   Maternity Care Coordinator / Child Services Coordination / Early Interventions:  Cultural issues impacting care:    III  STRENGTHS Strengths  Adequate Resources  Home prepared for Child (including basic supplies)  Supportive family/friends   Strength comment:    IV  RISK FACTORS AND CURRENT PROBLEMS Current Problem:  YES   Risk Factor & Current Problem Patient Issue Family Issue Risk Factor / Current Problem Comment  Mental Illness Y N Hx of PP depression & anxiety  Substance Abuse Y N Hx MJ & Etoh    V  SOCIAL WORK ASSESSMENT CSW referral received to assess pt's history of PP depression, anxiety & substance use.  Pt was diagnosed with PP depression after the birth of her son in 2013 however pt denies depression symptoms.  Pt explained that her doctor thought she was depressed because she & her  son's father were not together.  Pt was prescribed medication, of which she did not take.  Pt & FOB are not in a relationship now, as she thinks he is on drugs.  She told CSW that he is irresponsible & does not want him around her or their children.   She denies any domestic violence between them. Pt is not interested in counseling or medication at this time.  She denies any depressed feelings, as she told CSW that she is older & "over all that."  No SI history, as per pt.  Pt is currently living with her mother who she describes as her primary support person.  She has all the necessary supplies for the infant.  Pt spoke openly with CSW & appears to be appropriate at this time.  CSW encouraged pt to seek medical attention if PP depression symptoms arise & pt agrees.  CSW available to assist further if needed.      VI SOCIAL WORK PLAN Social Work Plan  No Further Intervention Required / No Barriers to Discharge   Type of pt/family education:   If child protective services report - county:   If child protective services report - date:   Information/referral to community resources comment:   Other social work plan:

## 2013-07-03 NOTE — Anesthesia Postprocedure Evaluation (Signed)
Anesthesia Post Note  Patient: [name] [name]  Procedure(s) Performed: * No procedures listed *  Anesthesia type: Epidural  Patient location: Mother/Baby  Post pain: Pain level controlled  Post assessment: Post-op Vital signs reviewed  Last Vitals:  Filed Vitals:   07/03/13 0615  BP: 117/74  Pulse: 56  Temp: 36.4 C  Resp: 16    Post vital signs: Reviewed  Level of consciousness:alert  Complications: No apparent anesthesia complications

## 2013-07-03 NOTE — Progress Notes (Signed)
UR chart review completed.  

## 2013-07-04 MED ORDER — OXYCODONE-ACETAMINOPHEN 5-325 MG PO TABS: 1.0000 | ORAL_TABLET | ORAL | Status: DC | PRN

## 2013-07-04 NOTE — Discharge Summary (Signed)
Obstetric Discharge Summary Reason for Admission: induction of labor Prenatal Procedures: none Intrapartum Procedures: spontaneous vaginal delivery Postpartum Procedures: none Complications-Operative and Postpartum: none Hemoglobin  Date Value Range Status  07/03/2013 10.2* 12.0 - 15.0 g/dL Final     HCT  Date Value Range Status  07/03/2013 29.5* 36.0 - 46.0 % Final    Physical Exam:  General: alert and cooperative Lochia: appropriate Uterine Fundus: firm Incision: NA DVT Evaluation: No evidence of DVT seen on physical exam.  Discharge Diagnoses: Term Pregnancy-delivered  Discharge Information: Date: 07/04/2013 Activity: pelvic rest Diet: routine Medications: Percocet Condition: stable Instructions: refer to practice specific booklet Discharge to: home  Patient to make PCP appointment for f/u form chronic back pain and pain management. In addition patient had GI bleeding (not currently active) r/t ibuprofen intake prior to pregnancy and h/o ulcers. Hct stable at this time. Do not recommend ibuprofen at this time. Patient to notify PCP of complaint ASAP for further management.   Patient plans L[facility] vs BTL will call to schedule appt.  Newborn Data: Live born female  Birth Weight: 8 lb 3.2 oz (3720 g) APGAR: 8, 9  Home with mother.  [name] [name] 07/04/2013, 9:55 AM

## 2013-07-05 ENCOUNTER — Telehealth: Payer: Self-pay

## 2013-07-05 NOTE — Telephone Encounter (Signed)
I left a message for patient to return my call. 

## 2013-07-05 NOTE — Telephone Encounter (Signed)
Patient returned phone call. Best # [phone]

## 2013-07-05 NOTE — Telephone Encounter (Signed)
Pt left a message stating that she has delivered her baby girl on Tues and her back hurts 10 times more now. Please advise?

## 2013-07-05 NOTE — Telephone Encounter (Signed)
Having Epilepsy issues and anxiety that comes with it. Wants to see a specialist for her back. Can't stand longer than 15 mins.  Pt is coming in on Monday at 3 to discuss this

## 2013-07-08 ENCOUNTER — Ambulatory Visit (INDEPENDENT_AMBULATORY_CARE_PROVIDER_SITE_OTHER): Payer: Medicaid Other | Admitting: Family Medicine

## 2013-07-08 ENCOUNTER — Ambulatory Visit (HOSPITAL_BASED_OUTPATIENT_CLINIC_OR_DEPARTMENT_OTHER)
Admission: RE | Admit: 2013-07-08 | Discharge: 2013-07-08 | Disposition: A | Discharge status: Home / Self Care | Payer: Medicaid Other | Source: Ambulatory Visit | Attending: Family Medicine | Admitting: Family Medicine

## 2013-07-08 ENCOUNTER — Encounter: Payer: Self-pay | Admitting: Family Medicine

## 2013-07-08 VITALS — BP 110/68 | HR 63 | Temp 98.1°F | Ht 66.0 in | Wt 129.1 lb

## 2013-07-08 DIAGNOSIS — F419 Anxiety disorder, unspecified: Secondary | ICD-10-CM

## 2013-07-08 DIAGNOSIS — M47819 Spondylosis without myelopathy or radiculopathy, site unspecified: Secondary | ICD-10-CM

## 2013-07-08 DIAGNOSIS — G4700 Insomnia, unspecified: Secondary | ICD-10-CM

## 2013-07-08 DIAGNOSIS — F411 Generalized anxiety disorder: Secondary | ICD-10-CM

## 2013-07-08 DIAGNOSIS — M479 Spondylosis, unspecified: Secondary | ICD-10-CM

## 2013-07-08 DIAGNOSIS — Z23 Encounter for immunization: Secondary | ICD-10-CM

## 2013-07-08 DIAGNOSIS — M545 Low back pain, unspecified: Secondary | ICD-10-CM | POA: Insufficient documentation

## 2013-07-08 DIAGNOSIS — M549 Dorsalgia, unspecified: Secondary | ICD-10-CM

## 2013-07-08 MED ORDER — CLONAZEPAM 0.5 MG PO TABS: 0.5000 mg | ORAL_TABLET | Freq: Two times a day (BID) | ORAL | Status: DC | PRN

## 2013-07-08 MED ORDER — OXYCODONE-ACETAMINOPHEN 5-325 MG PO TABS: 1.0000 | ORAL_TABLET | ORAL | Status: DC | PRN

## 2013-07-08 NOTE — Progress Notes (Signed)
Patient ID: [name] [name], female   DOB: January 28, 1981, 32 y.o.   MRN: [id number] [name] [name] [id number] Apr 15, 1981 07/08/2013      Progress Note-Follow Up  Subjective  Chief Complaint  Chief Complaint  Patient presents with  . Follow-up    on back pain  . Injections    flu    HPI  Patient is a 32 year old Caucasian female in today to discuss worsening back pain, has a long history of neck pain but lower back pain is worsening. She is unable to stand for more than 15 minutes without low back pain becoming significant and having radicular pain down both legs. When she rests the pain improves but never resolves. She did recently give her to an 8 lbs. 3 oz. baby and acknowledges that the pain has been worse since then. No other recent illness. Continues to smoke about 3 cigarettes a day. No chest pain or palpitations. No shortness of breath GI or GU concerns. Is having trouble sleeping but denies any significant flare and depression and  Past Medical History  Diagnosis Date  . Mental disorder   . Headache(784.0)   . Preterm labor   . Fracture of spinal vertebra 2009    Surgery in 2011  . Seizures     Last seizure 2005  . Hemorrhoids thrombosed   . Anxiety   . Postpartum depression 11/27/2011  . PHARYNGITIS 10/13/2010    Qualifier: Diagnosis of  By: Abner Greenspan MD, Misty Stanley    . Acute bronchitis 10/13/2010    Qualifier: Diagnosis of  By: Abner Greenspan MD, Misty Stanley    . Anxiety and depression 11/27/2011  . Nausea & vomiting 07/10/2012    Past Surgical History  Procedure Laterality Date  . Cervical fusion  05/26/2011    She thinks it was C2    Family History  Problem Relation Age of Onset  . Arthritis Mother   . Hypertension Mother   . Coronary artery disease Father     s/p MIs/ smoker  . Alcohol abuse Father   . Heart attack Father   . Endometriosis Sister   . Aplastic anemia Maternal Grandmother   . Arthritis Maternal Grandmother     s/p hip replacement for arthritis  . Anemia  Maternal Grandmother     aplastic  . COPD Maternal Grandmother   . Heart disease Maternal Grandmother   . Scoliosis Maternal Grandfather     s/p surgeries  . Endometriosis Sister   . Cancer Sister     cervical  . Hypertension Sister   . Otitis media Daughter     History   Social History  . Marital Status: Single    Spouse Name: N/A    Number of Children: 3  . Years of Education: N/A   Occupational History  . Student     Full time [employer]   Social History Main Topics  . Smoking status: Current Every Day Smoker -- 0.50 packs/day for 17 years    Types: Cigarettes  . Smokeless tobacco: Never Used  . Alcohol Use: No  . Drug Use: No  . Sexual Activity: Not Currently    Partners: Male   Other Topics Concern  . Not on file   Social History Narrative  . No narrative on file    Current Outpatient Prescriptions on File Prior to Visit  Medication Sig Dispense Refill  . [DISCONTINUED] venlafaxine (EFFEXOR XR) 37.5 MG 24 hr capsule Take 1 capsule (37.5 mg total) by mouth daily.  31  capsule  1   No current facility-administered medications on file prior to visit.    Allergies  Allergen Reactions  . Cephalexin Diarrhea and Nausea And Vomiting    Review of Systems  Review of Systems  Constitutional: Negative for fever and malaise/fatigue.  HENT: Negative for congestion.   Eyes: Negative for discharge.  Respiratory: Negative for shortness of breath.   Cardiovascular: Negative for chest pain, palpitations and leg swelling.  Gastrointestinal: Negative for nausea, abdominal pain and diarrhea.  Genitourinary: Negative for dysuria.  Musculoskeletal: Positive for back pain. Negative for falls.  Skin: Negative for rash.  Neurological: Negative for loss of consciousness and headaches.  Endo/Heme/Allergies: Negative for polydipsia.  Psychiatric/Behavioral: Negative for depression and suicidal ideas. The patient is nervous/anxious and has insomnia.     Objective  BP 110/68   Pulse 63  Temp(Src) 98.1 F (36.7 C) (Oral)  Ht 5\' 6"  (1.676 m)  Wt 129 lb 1.9 oz (58.568 kg)  BMI 20.85 kg/m2  SpO2 98%  LMP 10/12/2012  Breastfeeding? No  Physical Exam  Physical Exam  Constitutional: She is oriented to person, place, and time and well-developed, well-nourished, and in no distress. No distress.  HENT:  Head: Normocephalic and atraumatic.  Eyes: Conjunctivae are normal.  Neck: Neck supple. No thyromegaly present.  Cardiovascular: Normal rate, regular rhythm and normal heart sounds.   No murmur heard. Pulmonary/Chest: Effort normal and breath sounds normal. She has no wheezes.  Abdominal: She exhibits no distension and no mass.  Musculoskeletal: She exhibits no edema.  Lymphadenopathy:    She has no cervical adenopathy.  Neurological: She is alert and oriented to person, place, and time.  Skin: Skin is warm and dry. No rash noted. She is not diaphoretic.  Psychiatric: Memory, affect and judgment normal.    Lab Results  Component Value Date   TSH 1.218 11/21/2011   Lab Results  Component Value Date   WBC 10.3 07/03/2013   HGB 10.2* 07/03/2013   HCT 29.5* 07/03/2013   MCV 92.5 07/03/2013   PLT 205 07/03/2013   Lab Results  Component Value Date   CREATININE 0.82 11/21/2011   BUN 17 11/21/2011   NA 140 11/21/2011   K 4.5 11/21/2011   CL 107 11/21/2011   CO2 23 11/21/2011   Lab Results  Component Value Date   ALT 11 11/21/2011   AST 13 11/21/2011   ALKPHOS 118* 11/21/2011   BILITOT 0.6 11/21/2011   Lab Results  Component Value Date   CHOL 140 09/13/2010   Lab Results  Component Value Date   HDL 46.10 09/13/2010   Lab Results  Component Value Date   LDLCALC 88 09/13/2010   Lab Results  Component Value Date   TRIG 29.0 09/13/2010   Lab Results  Component Value Date   CHOLHDL 3 09/13/2010     Assessment & Plan  Arthritis of spine H/o fracture and arthritis s/p surgical stabilization in cervical spine now having increased pain and symptoms in  lower back since her recent pregnancy. Referred back to neurosurgery for further consideration. Given refill on pain meds and xray of low back obtained  Insomnia Discussed good sleep hygiene and allowed to use Clonazepam prn

## 2013-07-08 NOTE — Patient Instructions (Signed)
Back Pain, Adult  Low back pain is very common. About 1 in 5 people have back pain. The cause of low back pain is rarely dangerous. The pain often gets better over time. About half of people with a sudden onset of back pain feel better in just 2 weeks. About 8 in 10 people feel better by 6 weeks.   CAUSES  Some common causes of back pain include:  · Strain of the muscles or ligaments supporting the spine.  · Wear and tear (degeneration) of the spinal discs.  · Arthritis.  · Direct injury to the back.  DIAGNOSIS  Most of the time, the direct cause of low back pain is not known. However, back pain can be treated effectively even when the exact cause of the pain is unknown. Answering your caregiver's questions about your overall health and symptoms is one of the most accurate ways to make sure the cause of your pain is not dangerous. If your caregiver needs more information, he or she may order lab work or imaging tests (X-rays or MRIs). However, even if imaging tests show changes in your back, this usually does not require surgery.  HOME CARE INSTRUCTIONS  For many people, back pain returns. Since low back pain is rarely dangerous, it is often a condition that people can learn to manage on their own.   · Remain active. It is stressful on the back to sit or stand in one place. Do not sit, drive, or stand in one place for more than 30 minutes at a time. Take short walks on level surfaces as soon as pain allows. Try to increase the length of time you walk each day.  · Do not stay in bed. Resting more than 1 or 2 days can delay your recovery.  · Do not avoid exercise or work. Your body is made to move. It is not dangerous to be active, even though your back may hurt. Your back will likely heal faster if you return to being active before your pain is gone.  · Pay attention to your body when you  bend and lift. Many people have less discomfort when lifting if they bend their knees, keep the load close to their bodies, and  avoid twisting. Often, the most comfortable positions are those that put less stress on your recovering back.  · Find a comfortable position to sleep. Use a firm mattress and lie on your side with your knees slightly bent. If you lie on your back, put a pillow under your knees.  · Only take over-the-counter or prescription medicines as directed by your caregiver. Over-the-counter medicines to reduce pain and inflammation are often the most helpful. Your caregiver may prescribe muscle relaxant drugs. These medicines help dull your pain so you can more quickly return to your normal activities and healthy exercise.  · Put ice on the injured area.  · Put ice in a plastic bag.  · Place a towel between your skin and the bag.  · Leave the ice on for 15-20 minutes, 3-4 times a day for the first 2 to 3 days. After that, ice and heat may be alternated to reduce pain and spasms.  · Ask your caregiver about trying back exercises and gentle massage. This may be of some benefit.  · Avoid feeling anxious or stressed. Stress increases muscle tension and can worsen back pain. It is important to recognize when you are anxious or stressed and learn ways to manage it. Exercise is a great option.  SEEK MEDICAL CARE IF:  · You have pain that is not relieved with rest or   medicine.  · You have pain that does not improve in 1 week.  · You have new symptoms.  · You are generally not feeling well.  SEEK IMMEDIATE MEDICAL CARE IF:   · You have pain that radiates from your back into your legs.  · You develop new bowel or bladder control problems.  · You have unusual weakness or numbness in your arms or legs.  · You develop nausea or vomiting.  · You develop abdominal pain.  · You feel faint.  Document Released: 10/10/2005 Document Revised: 04/10/2012 Document Reviewed: 02/28/2011  Exit[employer]® Patient Information ©2014 [employer].

## 2013-07-09 NOTE — Progress Notes (Signed)
Quick Note:  Patient Informed and voiced understanding ______ 

## 2013-07-11 ENCOUNTER — Ambulatory Visit: Payer: Medicaid Other | Admitting: Family Medicine

## 2013-07-13 ENCOUNTER — Encounter: Payer: Self-pay | Admitting: Family Medicine

## 2013-07-13 DIAGNOSIS — G4700 Insomnia, unspecified: Secondary | ICD-10-CM | POA: Insufficient documentation

## 2013-07-13 HISTORY — DX: Insomnia, unspecified: G47.00

## 2013-07-13 NOTE — Assessment & Plan Note (Signed)
H/o fracture and arthritis s/p surgical stabilization in cervical spine now having increased pain and symptoms in lower back since her recent pregnancy. Referred back to neurosurgery for further consideration. Given refill on pain meds and xray of low back obtained

## 2013-07-13 NOTE — Assessment & Plan Note (Signed)
Discussed good sleep hygiene and allowed to use Clonazepam prn

## 2013-07-22 ENCOUNTER — Other Ambulatory Visit: Payer: Self-pay

## 2013-07-22 DIAGNOSIS — M549 Dorsalgia, unspecified: Secondary | ICD-10-CM

## 2013-07-22 NOTE — Telephone Encounter (Signed)
No her rx was supposed to be a month supply if that is not enough we need an appt to change rx

## 2013-07-22 NOTE — Telephone Encounter (Signed)
Patient left a message stating that she is going to be running out of her pain medication. Pt stated she has been having to take 2 tabs q 6 hours. Pt would like a new RX?  Please advise refill? Last RX was done on 07-08-13 quantity 120 with 0 refills

## 2013-07-23 NOTE — Telephone Encounter (Signed)
Patient informed and voiced understanding. Appt scheduled for tomorrow at 11:15

## 2013-07-24 ENCOUNTER — Encounter: Payer: Self-pay | Admitting: Family Medicine

## 2013-07-24 ENCOUNTER — Ambulatory Visit (INDEPENDENT_AMBULATORY_CARE_PROVIDER_SITE_OTHER): Payer: Medicaid Other | Admitting: Family Medicine

## 2013-07-24 ENCOUNTER — Telehealth: Payer: Self-pay

## 2013-07-24 VITALS — BP 104/68 | HR 103 | Temp 98.2°F | Ht 66.0 in | Wt 127.1 lb

## 2013-07-24 DIAGNOSIS — S129XXA Fracture of neck, unspecified, initial encounter: Secondary | ICD-10-CM

## 2013-07-24 DIAGNOSIS — M549 Dorsalgia, unspecified: Secondary | ICD-10-CM

## 2013-07-24 DIAGNOSIS — R569 Unspecified convulsions: Secondary | ICD-10-CM

## 2013-07-24 MED ORDER — OXYCODONE-ACETAMINOPHEN 5-325 MG PO TABS: 1.0000 | ORAL_TABLET | ORAL | Status: DC | PRN

## 2013-07-24 MED ORDER — FENTANYL 50 MCG/HR TD PT72: 1.0000 | MEDICATED_PATCH | TRANSDERMAL | Status: DC

## 2013-07-24 NOTE — Telephone Encounter (Signed)
Controlled substance paperwork sent to be scanned 

## 2013-07-24 NOTE — Patient Instructions (Signed)
Back Pain, Adult  Low back pain is very common. About 1 in 5 people have back pain. The cause of low back pain is rarely dangerous. The pain often gets better over time. About half of people with a sudden onset of back pain feel better in just 2 weeks. About 8 in 10 people feel better by 6 weeks.   CAUSES  Some common causes of back pain include:  · Strain of the muscles or ligaments supporting the spine.  · Wear and tear (degeneration) of the spinal discs.  · Arthritis.  · Direct injury to the back.  DIAGNOSIS  Most of the time, the direct cause of low back pain is not known. However, back pain can be treated effectively even when the exact cause of the pain is unknown. Answering your caregiver's questions about your overall health and symptoms is one of the most accurate ways to make sure the cause of your pain is not dangerous. If your caregiver needs more information, he or she may order lab work or imaging tests (X-rays or MRIs). However, even if imaging tests show changes in your back, this usually does not require surgery.  HOME CARE INSTRUCTIONS  For many people, back pain returns. Since low back pain is rarely dangerous, it is often a condition that people can learn to manage on their own.   · Remain active. It is stressful on the back to sit or stand in one place. Do not sit, drive, or stand in one place for more than 30 minutes at a time. Take short walks on level surfaces as soon as pain allows. Try to increase the length of time you walk each day.  · Do not stay in bed. Resting more than 1 or 2 days can delay your recovery.  · Do not avoid exercise or work. Your body is made to move. It is not dangerous to be active, even though your back may hurt. Your back will likely heal faster if you return to being active before your pain is gone.  · Pay attention to your body when you  bend and lift. Many people have less discomfort when lifting if they bend their knees, keep the load close to their bodies, and  avoid twisting. Often, the most comfortable positions are those that put less stress on your recovering back.  · Find a comfortable position to sleep. Use a firm mattress and lie on your side with your knees slightly bent. If you lie on your back, put a pillow under your knees.  · Only take over-the-counter or prescription medicines as directed by your caregiver. Over-the-counter medicines to reduce pain and inflammation are often the most helpful. Your caregiver may prescribe muscle relaxant drugs. These medicines help dull your pain so you can more quickly return to your normal activities and healthy exercise.  · Put ice on the injured area.  · Put ice in a plastic bag.  · Place a towel between your skin and the bag.  · Leave the ice on for 15-20 minutes, 3-4 times a day for the first 2 to 3 days. After that, ice and heat may be alternated to reduce pain and spasms.  · Ask your caregiver about trying back exercises and gentle massage. This may be of some benefit.  · Avoid feeling anxious or stressed. Stress increases muscle tension and can worsen back pain. It is important to recognize when you are anxious or stressed and learn ways to manage it. Exercise is a great option.  SEEK MEDICAL CARE IF:  · You have pain that is not relieved with rest or   medicine.  · You have pain that does not improve in 1 week.  · You have new symptoms.  · You are generally not feeling well.  SEEK IMMEDIATE MEDICAL CARE IF:   · You have pain that radiates from your back into your legs.  · You develop new bowel or bladder control problems.  · You have unusual weakness or numbness in your arms or legs.  · You develop nausea or vomiting.  · You develop abdominal pain.  · You feel faint.  Document Released: 10/10/2005 Document Revised: 04/10/2012 Document Reviewed: 02/28/2011  Exit[employer]® Patient Information ©2014 [employer].

## 2013-07-26 ENCOUNTER — Telehealth: Payer: Self-pay

## 2013-07-26 NOTE — Telephone Encounter (Signed)
PA for Fentanyl Patch sent

## 2013-07-28 ENCOUNTER — Encounter: Payer: Self-pay | Admitting: Family Medicine

## 2013-07-28 NOTE — Assessment & Plan Note (Addendum)
Chronic pain in neck is worsening and low back pain is also worsening, she has a number of her own small children and now cares for some of her sister's children. Her pain is worsening and she is awaiting consultation with neurosurgery. She has been on opiates for quite some time for her pain. Will try long acting Fentanyl as her baseline and continue breakthrough pain control on Oxycodone

## 2013-07-28 NOTE — Progress Notes (Signed)
Patient ID: MARGERT EDSALL, female   DOB: 1981-08-02, 32 y.o.   MRN: [id number] ASHNA DOROUGH [id number] 06-17-1981 07/28/2013      Progress Note-Follow Up  Subjective  Chief Complaint  Chief Complaint  Patient presents with  . Follow-up    on pain medication    HPI  Patient is a 32 year old Caucasian female who is in to discuss worsening back pain. She has chronic neck pain and has had to have surgery in the past after an injury. Also has chronic low back pain which is worsening. It began to worsen during her last pregnancy and presents now. No other symptoms or incontinence. No recent seizures. No chest pain or palpitations. No falls, no radicular pain down the legs. She cares for numerous small children and acknowledges she has significant pain each day gets only minimal relief and oxycodone.  Past Medical History  Diagnosis Date  . Mental disorder   . Headache(784.0)   . Preterm labor   . Fracture of spinal vertebra 2009    Surgery in 2011  . Seizures     Last seizure 2005  . Hemorrhoids thrombosed   . Anxiety   . Postpartum depression 11/27/2011  . PHARYNGITIS 10/13/2010    Qualifier: Diagnosis of  By: Abner Greenspan MD, Misty Stanley    . Acute bronchitis 10/13/2010    Qualifier: Diagnosis of  By: Abner Greenspan MD, Misty Stanley    . Anxiety and depression 11/27/2011  . Nausea & vomiting 07/10/2012  . Arthritis of spine 08/16/2010    Qualifier: Diagnosis of  By: Abner Greenspan MD, Misty Stanley    . Insomnia 07/13/2013    Past Surgical History  Procedure Laterality Date  . Cervical fusion  05/26/2011    She thinks it was C2    Family History  Problem Relation Age of Onset  . Arthritis Mother   . Hypertension Mother   . Coronary artery disease Father     s/p MIs/ smoker  . Alcohol abuse Father   . Heart attack Father   . Endometriosis Sister   . Aplastic anemia Maternal Grandmother   . Arthritis Maternal Grandmother     s/p hip replacement for arthritis  . Anemia Maternal Grandmother     aplastic   . COPD Maternal Grandmother   . Heart disease Maternal Grandmother   . Scoliosis Maternal Grandfather     s/p surgeries  . Endometriosis Sister   . Cancer Sister     cervical  . Hypertension Sister   . Otitis media Daughter     History   Social History  . Marital Status: Single    Spouse Name: N/A    Number of Children: 3  . Years of Education: N/A   Occupational History  . Student     Full time [employer]   Social History Main Topics  . Smoking status: Current Every Day Smoker -- 0.50 packs/day for 17 years    Types: Cigarettes  . Smokeless tobacco: Never Used  . Alcohol Use: No  . Drug Use: No  . Sexual Activity: Not Currently    Partners: Male   Other Topics Concern  . Not on file   Social History Narrative  . No narrative on file    Current Outpatient Prescriptions on File Prior to Visit  Medication Sig Dispense Refill  . clonazePAM (KLONOPIN) 0.5 MG tablet Take 1 tablet (0.5 mg total) by mouth 2 (two) times daily as needed for anxiety.  60 tablet  2  . [DISCONTINUED] venlafaxine [facility]  XR) 37.5 MG 24 hr capsule Take 1 capsule (37.5 mg total) by mouth daily.  30 capsule  1   No current facility-administered medications on file prior to visit.    Allergies  Allergen Reactions  . Cephalexin Diarrhea and Nausea And Vomiting    Review of Systems  Review of Systems  Constitutional: Positive for malaise/fatigue. Negative for fever.  HENT: Positive for neck pain. Negative for congestion.   Eyes: Negative for discharge.  Respiratory: Negative for shortness of breath.   Cardiovascular: Negative for chest pain, palpitations and leg swelling.  Gastrointestinal: Negative for nausea, abdominal pain and diarrhea.  Genitourinary: Negative for dysuria.  Musculoskeletal: Positive for myalgias and back pain. Negative for joint pain and falls.  Skin: Negative for rash.  Neurological: Negative for loss of consciousness and headaches.  Endo/Heme/Allergies: Negative for  polydipsia.  Psychiatric/Behavioral: Negative for depression and suicidal ideas. The patient is nervous/anxious. The patient does not have insomnia.     Objective  BP 104/68  Pulse 103  Temp(Src) 98.2 F (36.8 C) (Oral)  Ht 5\' 6"  (1.676 m)  Wt 127 lb 1.3 oz (57.643 kg)  BMI 20.52 kg/m2  SpO2 97%  LMP 10/12/2012  Physical Exam  Physical Exam  Constitutional: She is oriented to person, place, and time and well-developed, well-nourished, and in no distress. No distress.  HENT:  Head: Normocephalic and atraumatic.  Eyes: Conjunctivae are normal.  Neck: Neck supple. No thyromegaly present.  Cardiovascular: Normal rate, regular rhythm and normal heart sounds.   No murmur heard. Pulmonary/Chest: Effort normal and breath sounds normal. She has no wheezes.  Abdominal: She exhibits no distension and no mass.  Musculoskeletal: She exhibits no edema.  Lymphadenopathy:    She has no cervical adenopathy.  Neurological: She is alert and oriented to person, place, and time.  Skin: Skin is warm and dry. No rash noted. She is not diaphoretic.  Psychiatric: Memory, affect and judgment normal.    Lab Results  Component Value Date   TSH 1.218 11/21/2011   Lab Results  Component Value Date   WBC 10.3 07/03/2013   HGB 10.2* 07/03/2013   HCT 29.5* 07/03/2013   MCV 92.5 07/03/2013   PLT 205 07/03/2013   Lab Results  Component Value Date   CREATININE 0.82 11/21/2011   BUN 17 11/21/2011   NA 140 11/21/2011   K 4.5 11/21/2011   CL 107 11/21/2011   CO2 23 11/21/2011   Lab Results  Component Value Date   ALT 11 11/21/2011   AST 13 11/21/2011   ALKPHOS 118* 11/21/2011   BILITOT 0.6 11/21/2011   Lab Results  Component Value Date   CHOL 140 09/13/2010   Lab Results  Component Value Date   HDL 46.10 09/13/2010   Lab Results  Component Value Date   LDLCALC 88 09/13/2010   Lab Results  Component Value Date   TRIG 29.0 09/13/2010   Lab Results  Component Value Date   CHOLHDL 3 09/13/2010      Assessment & Plan  VERTEBRAL FRACTURE, CERVICAL SPINE Chronic pain in neck is worsening and low back pain is also worsening, she has a number of her own small children and now cares for some of her sister's children. Her pain is worsening and she is awaiting consultation with neurosurgery. She has been on opiates for quite some time for her pain. Will try long acting Fentanyl as her baseline and continue breakthrough pain control on Oxycodone  SEIZURE DISORDER No recent episodes.

## 2013-07-28 NOTE — Assessment & Plan Note (Signed)
No recent episodes

## 2013-07-30 ENCOUNTER — Telehealth: Payer: Self-pay | Admitting: Family Medicine

## 2013-07-30 NOTE — Telephone Encounter (Signed)
Frustration. Please let patient know the situation and see if we can get her in with someone at [facility] or [facility].

## 2013-07-30 NOTE — Telephone Encounter (Signed)
She is a patient at [facility].  She has seen Dr Allena Katz.  He does injections for pain.  The Neurosurgery doctors will not see her as she has had surgery and has hardware in place.  They want her to go to the doctor who placed the hardware.  Spoke with the patient and the Dr that placed the hardware is Dr Franky Mach[facility].  They will not see the patient as she has had too many no show appointments and has a large unpaid balance

## 2013-07-31 ENCOUNTER — Telehealth: Payer: Self-pay | Admitting: *Deleted

## 2013-07-31 NOTE — Telephone Encounter (Signed)
Patient [name] [name] to inform you that Medicaid did not approve Fentanyl patch, please call pt on home phone as cell phone has been disconnected/SLS

## 2013-08-01 NOTE — Telephone Encounter (Signed)
Morphine sulfate ER 15 mg po bid, #60

## 2013-08-01 NOTE — Telephone Encounter (Signed)
Patient informed and is going to try to go by the pharmacy to get the RX and bring it tomorrow.  Please advise new RX

## 2013-08-01 NOTE — Telephone Encounter (Signed)
I will need to refer to pain management perhaps. If she wants I have one more long acting I can try but she will have to come back in with the Fentanyl rx so we can exchange

## 2013-08-01 NOTE — Telephone Encounter (Signed)
Please advise 

## 2013-08-02 ENCOUNTER — Other Ambulatory Visit: Payer: Self-pay | Admitting: Family Medicine

## 2013-08-02 ENCOUNTER — Telehealth: Payer: Self-pay | Admitting: Family Medicine

## 2013-08-02 DIAGNOSIS — R52 Pain, unspecified: Secondary | ICD-10-CM

## 2013-08-02 MED ORDER — OXYCODONE HCL 15 MG PO TABS: 15.0000 mg | ORAL_TABLET | Freq: Four times a day (QID) | ORAL | Status: DC | PRN

## 2013-08-02 MED ORDER — MORPHINE SULFATE ER 15 MG PO TBCR: 15.0000 mg | EXTENDED_RELEASE_TABLET | Freq: Two times a day (BID) | ORAL | Status: DC

## 2013-08-02 NOTE — Telephone Encounter (Signed)
Please advise 

## 2013-08-02 NOTE — Telephone Encounter (Signed)
Patient states that her insurance is requiring a prior auth for the morphine. She says that she would just like to go back to oxycodone 10mg . She will bring back in the morphine prescription today.

## 2013-08-02 NOTE — Telephone Encounter (Signed)
Patient came in and Dr Abner Greenspan gave pt RX

## 2013-08-02 NOTE — Telephone Encounter (Signed)
RX at front desk. Pt needs to leave Fentanyl Patch RX to pick these up.

## 2013-08-02 NOTE — Telephone Encounter (Signed)
Once she brings back the Morphine we can rewrite the Oxycodone 15 mg tabs, disp #120, 1 tab po q 6 hours prn pain, (notice the strength is higher per pill so she can take less

## 2013-08-02 NOTE — Progress Notes (Signed)
Took Morphine prescription from patient and placed in shred receptacle, handed patient prescription for Oxycodone 15 mg tabs as prescribed

## 2013-08-02 NOTE — Telephone Encounter (Signed)
Per pt PA not approved

## 2013-08-28 ENCOUNTER — Encounter: Payer: Self-pay | Admitting: Family Medicine

## 2013-08-28 DIAGNOSIS — R52 Pain, unspecified: Secondary | ICD-10-CM

## 2013-08-29 MED ORDER — OXYCODONE HCL 15 MG PO TABS: 15.0000 mg | ORAL_TABLET | Freq: Four times a day (QID) | ORAL | Status: DC | PRN

## 2013-08-29 NOTE — Telephone Encounter (Signed)
Patient informed and voices understanding 

## 2013-09-24 ENCOUNTER — Ambulatory Visit: Payer: Medicaid Other | Admitting: Family Medicine

## 2013-09-27 ENCOUNTER — Encounter: Payer: Self-pay | Admitting: Family Medicine

## 2013-09-27 ENCOUNTER — Ambulatory Visit (INDEPENDENT_AMBULATORY_CARE_PROVIDER_SITE_OTHER): Payer: Medicaid Other | Admitting: Family Medicine

## 2013-09-27 VITALS — BP 130/78 | HR 83 | Temp 98.3°F | Ht 66.0 in | Wt 133.1 lb

## 2013-09-27 DIAGNOSIS — S129XXA Fracture of neck, unspecified, initial encounter: Secondary | ICD-10-CM

## 2013-09-27 DIAGNOSIS — H6691 Otitis media, unspecified, right ear: Secondary | ICD-10-CM

## 2013-09-27 DIAGNOSIS — H6690 Otitis media, unspecified, unspecified ear: Secondary | ICD-10-CM

## 2013-09-27 DIAGNOSIS — R52 Pain, unspecified: Secondary | ICD-10-CM

## 2013-09-27 MED ORDER — OXYCODONE HCL 15 MG PO TABS: 15.0000 mg | ORAL_TABLET | Freq: Four times a day (QID) | ORAL | Status: DC | PRN

## 2013-09-27 MED ORDER — AMOXICILLIN-POT CLAVULANATE 875-125 MG PO TABS: 1.0000 | ORAL_TABLET | Freq: Two times a day (BID) | ORAL | Status: DC

## 2013-09-27 NOTE — Progress Notes (Signed)
Pre visit review using our clinic review tool, if applicable. No additional management support is needed unless otherwise documented below in the visit note. 

## 2013-09-27 NOTE — Patient Instructions (Signed)
Mucinex twice a day x 10 days and probiotics daily Otitis Media, Adult A middle ear infection is an infection in the space behind the eardrum. The medical name for this is "otitis media." It may happen after a common cold. It is caused by a germ that starts growing in that space. You may feel swollen glands in your neck on the side of the ear infection. HOME CARE INSTRUCTIONS   Take your medicine as directed until it is gone, even if you feel better after the first few days.  Only take over-the-counter or prescription medicines for pain, discomfort, or fever as directed by your caregiver.  Occasional use of a nasal decongestant a couple times per day may help with discomfort and help the eustachian tube to drain better. Follow up with your caregiver in 10 to 14 days or as directed, to be certain that the infection has cleared. Not keeping the appointment could result in a chronic or permanent injury, pain, hearing loss and disability. If there is any problem keeping the appointment, you must call back to this facility for assistance. SEEK IMMEDIATE MEDICAL CARE IF:   You are not getting better in 2 to 3 days.  You have pain that is not controlled with medication.  You feel worse instead of better.  You cannot use the medication as directed.  You develop swelling, redness or pain around the ear or stiffness in your neck. MAKE SURE YOU:   Understand these instructions.  Will watch your condition.  Will get help right away if you are not doing well or get worse. Document Released: 07/15/2004 Document Revised: 01/02/2012 Document Reviewed: 05/07/2013 [facility] Patient Information 2014 [place], [place].

## 2013-09-30 ENCOUNTER — Encounter: Payer: Self-pay | Admitting: Family Medicine

## 2013-09-30 DIAGNOSIS — H6690 Otitis media, unspecified, unspecified ear: Secondary | ICD-10-CM | POA: Insufficient documentation

## 2013-09-30 NOTE — Progress Notes (Signed)
Patient ID: [name] [name], female   DOB: February 14, 1981, 32 y.o.   MRN: [id number] [name] [name] [id number] 01-May-1981 09/30/2013      Progress Note-Follow Up  Subjective  Chief Complaint  Chief Complaint  Patient presents with  . chest congestion    X 2 weeks  . Cough    green mucus  . Otalgia    right ear    HPI  Patient's Caucasian female who is in today with complaints of 2 weeks worth of worsening congestion. She has both head and chest congestion. Cough is productive of green phlegm. Chest some ear pressure worse on the right than the left. Has had some low-grade chills but denies pain or shortness of breath. No GI complaints. Continues to have neck and back pain daily  Past Medical History  Diagnosis Date  . Mental disorder   . Headache(784.0)   . Preterm labor   . Fracture of spinal vertebra 2009    Surgery in 2011  . Seizures     Last seizure 2005  . Hemorrhoids thrombosed   . Anxiety   . Postpartum depression 11/27/2011  . PHARYNGITIS 10/13/2010    Qualifier: Diagnosis of  By: Abner Greenspan MD, Misty Stanley    . Acute bronchitis 10/13/2010    Qualifier: Diagnosis of  By: Abner Greenspan MD, Misty Stanley    . Anxiety and depression 11/27/2011  . Nausea & vomiting 07/10/2012  . Arthritis of spine 08/16/2010    Qualifier: Diagnosis of  By: Abner Greenspan MD, Misty Stanley    . Insomnia 07/13/2013    Past Surgical History  Procedure Laterality Date  . Cervical fusion  05/26/2011    She thinks it was C2    Family History  Problem Relation Age of Onset  . Arthritis Mother   . Hypertension Mother   . Coronary artery disease Father     s/p MIs/ smoker  . Alcohol abuse Father   . Heart attack Father   . Endometriosis Sister   . Aplastic anemia Maternal Grandmother   . Arthritis Maternal Grandmother     s/p hip replacement for arthritis  . Anemia Maternal Grandmother     aplastic  . COPD Maternal Grandmother   . Heart disease Maternal Grandmother   . Scoliosis Maternal Grandfather     s/p surgeries   . Endometriosis Sister   . Cancer Sister     cervical  . Hypertension Sister   . Otitis media Daughter     History   Social History  . Marital Status: Single    Spouse Name: N/A    Number of Children: 3  . Years of Education: N/A   Occupational History  . Student     Full time [employer]   Social History Main Topics  . Smoking status: Current Every Day Smoker -- 0.50 packs/day for 17 years    Types: Cigarettes  . Smokeless tobacco: Never Used  . Alcohol Use: No  . Drug Use: No  . Sexual Activity: Not Currently    Partners: Male   Other Topics Concern  . Not on file   Social History Narrative  . No narrative on file    Current Outpatient Prescriptions on File Prior to Visit  Medication Sig Dispense Refill  . clonazePAM (KLONOPIN) 0.5 MG tablet Take 1 tablet (0.5 mg total) by mouth 2 (two) times daily as needed for anxiety.  60 tablet  2  . [DISCONTINUED] venlafaxine (EFFEXOR XR) 37.5 MG 24 hr capsule Take 1 capsule (37.5 mg total)  by mouth daily.  30 capsule  1   No current facility-administered medications on file prior to visit.    Allergies  Allergen Reactions  . Cephalexin Diarrhea and Nausea And Vomiting    Review of Systems  Review of Systems  Constitutional: Positive for chills and malaise/fatigue. Negative for fever.  HENT: Positive for congestion and ear pain. Negative for hearing loss and nosebleeds.   Eyes: Negative for discharge.  Respiratory: Positive for cough and sputum production. Negative for shortness of breath and wheezing.   Cardiovascular: Negative for chest pain, palpitations and leg swelling.  Gastrointestinal: Negative for heartburn, nausea, vomiting, abdominal pain, diarrhea, constipation and blood in stool.  Genitourinary: Negative for dysuria, urgency, frequency and hematuria.  Musculoskeletal: Positive for back pain and neck pain. Negative for falls and myalgias.  Skin: Negative for rash.  Neurological: Negative for dizziness,  tremors, sensory change, focal weakness, loss of consciousness, weakness and headaches.  Endo/Heme/Allergies: Negative for polydipsia. Does not bruise/bleed easily.  Psychiatric/Behavioral: Negative for depression and suicidal ideas. The patient is not nervous/anxious and does not have insomnia.     Objective  BP 130/78  Pulse 83  Temp(Src) 98.3 F (36.8 C) (Oral)  Ht 5\' 6"  (1.676 m)  Wt 133 lb 1.3 oz (60.365 kg)  BMI 21.49 kg/m2  SpO2 98%  LMP 09/16/2013  Physical Exam Physical Exam  Constitutional: She is oriented to person, place, and time and well-developed, well-nourished, and in no distress. No distress.  HENT:  Head: Normocephalic and atraumatic.  Eyes: Conjunctivae are normal.  Neck: Neck supple. No thyromegaly present.  Cardiovascular: Normal rate, regular rhythm and normal heart sounds.   No murmur heard. Pulmonary/Chest: Effort normal and breath sounds normal. She has no wheezes.  Abdominal: She exhibits no distension and no mass.  Musculoskeletal: She exhibits no edema.  Lymphadenopathy:    She has no cervical adenopathy.  Neurological: She is alert and oriented to person, place, and time.  Skin: Skin is warm and dry. No rash noted. She is not diaphoretic.  Psychiatric: Memory, affect and judgment normal.    Lab Results  Component Value Date   TSH 1.218 11/21/2011   Lab Results  Component Value Date   WBC 10.3 07/03/2013   HGB 10.2* 07/03/2013   HCT 29.5* 07/03/2013   MCV 92.5 07/03/2013   PLT 205 07/03/2013   Lab Results  Component Value Date   CREATININE 0.82 11/21/2011   BUN 17 11/21/2011   NA 140 11/21/2011   K 4.5 11/21/2011   CL 107 11/21/2011   CO2 23 11/21/2011   Lab Results  Component Value Date   ALT 11 11/21/2011   AST 13 11/21/2011   ALKPHOS 118* 11/21/2011   BILITOT 0.6 11/21/2011   Lab Results  Component Value Date   CHOL 140 09/13/2010   Lab Results  Component Value Date   HDL 46.10 09/13/2010   Lab Results  Component Value Date    LDLCALC 88 09/13/2010   Lab Results  Component Value Date   TRIG 29.0 09/13/2010   Lab Results  Component Value Date   CHOLHDL 3 09/13/2010     Assessment & Plan  VERTEBRAL FRACTURE, CERVICAL SPINE Continues to struggle with daily neck pain status post her injury and surgery but also his low back pain now as well. He is awaiting evaluation at [facility]. May continue medications when necessary as prescribed  Otitis media Started on antibiotics, probiotics and mucinex

## 2013-09-30 NOTE — Assessment & Plan Note (Signed)
Started on antibiotics, probiotics and mucinex

## 2013-09-30 NOTE — Assessment & Plan Note (Signed)
Continues to struggle with daily neck pain status post her injury and surgery but also his low back pain now as well. He is awaiting evaluation at [facility]. May continue medications when necessary as prescribed

## 2013-10-28 ENCOUNTER — Encounter: Payer: Self-pay | Admitting: Family Medicine

## 2013-10-28 MED ORDER — OXYCODONE-ACETAMINOPHEN 5-325 MG PO TABS: 1.0000 | ORAL_TABLET | Freq: Four times a day (QID) | ORAL | Status: DC | PRN

## 2013-10-28 NOTE — Telephone Encounter (Signed)
Melissa,  Can you sign Rx in Dr's absence? Last rx printed 09/27/13.

## 2013-10-28 NOTE — Telephone Encounter (Signed)
See RX.  Please confirm with pt that she is no longer breast feeding.

## 2013-10-29 ENCOUNTER — Telehealth: Payer: Self-pay

## 2013-10-29 DIAGNOSIS — R52 Pain, unspecified: Secondary | ICD-10-CM

## 2013-10-29 MED ORDER — OXYCODONE HCL 15 MG PO TABS: 15.0000 mg | ORAL_TABLET | Freq: Four times a day (QID) | ORAL | Status: DC | PRN

## 2013-10-29 NOTE — Telephone Encounter (Signed)
Per md ok to print the 15 mg

## 2013-10-29 NOTE — Telephone Encounter (Signed)
Patients mother came to pick up the Oxy RX that [employer].  pts mother states that it is supposed to be the 15 mg Oxy?  Please advise? Last RX was done on 09-27-13 quantity 120 with 0 refills

## 2013-10-30 NOTE — Telephone Encounter (Signed)
Rx completed.  See 10/29/13 phone note.

## 2013-11-06 ENCOUNTER — Other Ambulatory Visit: Payer: Self-pay | Admitting: Family Medicine

## 2013-11-06 DIAGNOSIS — F329 Major depressive disorder, single episode, unspecified: Secondary | ICD-10-CM

## 2013-11-06 DIAGNOSIS — F419 Anxiety disorder, unspecified: Principal | ICD-10-CM

## 2013-11-08 NOTE — Telephone Encounter (Signed)
Please advise refill? Last RX was wrote on 07-08-13 quantity 60 with 2 refills  If ok fax to [phone]

## 2013-11-08 NOTE — Telephone Encounter (Signed)
Can we verify the patient does not breast feed?  If not, ok to send refill to pharmacy.  Medication pending -- Klonopin 0.5 mg BID Quantity 60; no additional refill.

## 2013-11-08 NOTE — Telephone Encounter (Signed)
She does not breast feed.  RX sent

## 2013-11-28 ENCOUNTER — Telehealth: Payer: Self-pay

## 2013-11-28 DIAGNOSIS — R52 Pain, unspecified: Secondary | ICD-10-CM

## 2013-11-28 MED ORDER — OXYCODONE HCL 15 MG PO TABS: 15.0000 mg | ORAL_TABLET | Freq: Three times a day (TID) | ORAL | Status: DC | PRN

## 2013-11-28 NOTE — Telephone Encounter (Signed)
Will refill quantity 90 with instruction to take 1 tablet every 8 hours as needed for severe pain.  Further refills will have to come from patient PCP.

## 2013-11-28 NOTE — Telephone Encounter (Signed)
Pt left a message stating that she needs a refill on her Oxy?  Please advise?  Last RX was done on 10-29-13 quantity 120 with 0 refills

## 2013-12-06 ENCOUNTER — Other Ambulatory Visit: Payer: Self-pay | Admitting: Family Medicine

## 2013-12-06 NOTE — Telephone Encounter (Signed)
Rx request to pharmacy/SLS  

## 2013-12-06 NOTE — Telephone Encounter (Signed)
Last Rx provided 11/06/13, #60 x no refills. Rx printed and forwarded to PRovider for signature.  Medication name:  Name from pharmacy:  clonazePAM (KLONOPIN) 0.5 MG tablet  CLONAZEPAM 0.5 MG TABLET Sig: TAKE 1 TABLET BY MOUTH TWICE DAILY Dispense: 60 tablet Refills: 0 Start: 12/06/2013 Class: Normal Requested on: 11/08/2013 Originally ordered on: 11/22/2010 Last refill: 11/08/2013

## 2013-12-18 ENCOUNTER — Encounter: Payer: Self-pay | Admitting: Family Medicine

## 2013-12-18 ENCOUNTER — Other Ambulatory Visit: Payer: Self-pay | Admitting: Family Medicine

## 2013-12-18 ENCOUNTER — Telehealth: Payer: Self-pay | Admitting: Family Medicine

## 2013-12-18 DIAGNOSIS — R52 Pain, unspecified: Secondary | ICD-10-CM

## 2013-12-18 MED ORDER — OXYCODONE HCL 15 MG PO TABS: 15.0000 mg | ORAL_TABLET | Freq: Four times a day (QID) | ORAL | Status: DC | PRN

## 2013-12-18 NOTE — Telephone Encounter (Signed)
Left message for patient informing her that her rx was ready for pickup.  Varney Daily, RMA [place] [place]            Please call and notify patient that her Oxy RX is ready to be picked up

## 2013-12-23 ENCOUNTER — Encounter: Payer: Self-pay | Admitting: Family Medicine

## 2013-12-28 ENCOUNTER — Other Ambulatory Visit: Payer: Self-pay | Admitting: Family Medicine

## 2013-12-30 NOTE — Telephone Encounter (Signed)
RX on MD's desk to sign stating that it can't be filled until 01-01-14. Last RX was done on 12-06-13 quantity 60 with 0 refills  We will fax RX

## 2013-12-31 ENCOUNTER — Ambulatory Visit: Payer: Medicaid Other | Admitting: Family Medicine

## 2014-01-23 ENCOUNTER — Encounter: Payer: Self-pay | Admitting: Family Medicine

## 2014-01-23 ENCOUNTER — Ambulatory Visit (INDEPENDENT_AMBULATORY_CARE_PROVIDER_SITE_OTHER): Payer: Medicaid Other | Admitting: Family Medicine

## 2014-01-23 VITALS — BP 142/90 | HR 114 | Temp 97.9°F | Ht 66.0 in | Wt 120.1 lb

## 2014-01-23 DIAGNOSIS — R52 Pain, unspecified: Secondary | ICD-10-CM

## 2014-01-23 DIAGNOSIS — F329 Major depressive disorder, single episode, unspecified: Secondary | ICD-10-CM

## 2014-01-23 DIAGNOSIS — F419 Anxiety disorder, unspecified: Secondary | ICD-10-CM

## 2014-01-23 DIAGNOSIS — F411 Generalized anxiety disorder: Secondary | ICD-10-CM

## 2014-01-23 DIAGNOSIS — J069 Acute upper respiratory infection, unspecified: Secondary | ICD-10-CM

## 2014-01-23 DIAGNOSIS — S129XXA Fracture of neck, unspecified, initial encounter: Secondary | ICD-10-CM

## 2014-01-23 DIAGNOSIS — F32A Depression, unspecified: Secondary | ICD-10-CM

## 2014-01-23 DIAGNOSIS — F17200 Nicotine dependence, unspecified, uncomplicated: Secondary | ICD-10-CM

## 2014-01-23 DIAGNOSIS — J329 Chronic sinusitis, unspecified: Secondary | ICD-10-CM

## 2014-01-23 DIAGNOSIS — F341 Dysthymic disorder: Secondary | ICD-10-CM

## 2014-01-23 MED ORDER — DOXYCYCLINE HYCLATE 100 MG PO TABS: 100.0000 mg | ORAL_TABLET | Freq: Two times a day (BID) | ORAL | Status: DC

## 2014-01-23 MED ORDER — CLONAZEPAM 0.5 MG PO TABS: 0.5000 mg | ORAL_TABLET | Freq: Two times a day (BID) | ORAL | Status: DC | PRN

## 2014-01-23 MED ORDER — OXYCODONE HCL 15 MG PO TABS: 15.0000 mg | ORAL_TABLET | Freq: Four times a day (QID) | ORAL | Status: DC | PRN

## 2014-01-23 NOTE — Patient Instructions (Signed)

## 2014-01-23 NOTE — Progress Notes (Signed)
Pre visit review using our clinic review tool, if applicable. No additional management support is needed unless otherwise documented below in the visit note. 

## 2014-01-24 NOTE — Assessment & Plan Note (Signed)
Encouraged complete cessation. Discussed need to quit as relates to risk of numerous cancers, cardiac and pulmonary disease as well as neurologic complications. Counseled for greater than 3 minutes 

## 2014-01-24 NOTE — Assessment & Plan Note (Signed)
Given refills on current pain medications.

## 2014-01-24 NOTE — Assessment & Plan Note (Signed)
Doing well allowed refill on Klonopin

## 2014-01-24 NOTE — Progress Notes (Signed)
Patient ID: [name] [name], female   DOB: 1980-11-22, 33 y.o.   MRN: [id number] [name] [name] [id number] [birth year] 01/24/2014      Progress Note-Follow Up  Subjective  Chief Complaint  Chief Complaint  Patient presents with  . Follow-up    HPI  Patient is a 33 year old female in today for routine medical care. He is struggling with recent increase in congestion again. Has some head congestion as well as a mild cough. No fevers or chills. Had an episode of gastroenteritis with nausea vomiting and diarrhea but that has resolved. Neck pain is persistent. Responded to meds. Denies CP/palp/SOB/HA/congestion/fevers/GI or GU c/o. Taking meds as prescribed  Past Medical History  Diagnosis Date  . Mental disorder   . Headache(784.0)   . Preterm labor   . Fracture of spinal vertebra 2009    Surgery in 2011  . Seizures     Last seizure 2005  . Hemorrhoids thrombosed   . Anxiety   . Postpartum depression 11/27/2011  . PHARYNGITIS 10/13/2010    Qualifier: Diagnosis of  By: Charlett Blake MD, Erline Levine    . Acute bronchitis 10/13/2010    Qualifier: Diagnosis of  By: Charlett Blake MD, Erline Levine    . Anxiety and depression 11/27/2011  . Nausea & vomiting 07/10/2012  . Arthritis of spine 08/16/2010    Qualifier: Diagnosis of  By: Charlett Blake MD, Erline Levine    . Insomnia 07/13/2013    Past Surgical History  Procedure Laterality Date  . Cervical fusion  05/26/2011    She thinks it was C2    Family History  Problem Relation Age of Onset  . Arthritis Mother   . Hypertension Mother   . Coronary artery disease Father     s/p MIs/ smoker  . Alcohol abuse Father   . Heart attack Father   . Endometriosis Sister   . Aplastic anemia Maternal Grandmother   . Arthritis Maternal Grandmother     s/p hip replacement for arthritis  . Anemia Maternal Grandmother     aplastic  . COPD Maternal Grandmother   . Heart disease Maternal Grandmother   . Scoliosis Maternal Grandfather     s/p surgeries  . Endometriosis Sister    . Cancer Sister     cervical  . Hypertension Sister   . Otitis media Daughter     History   Social History  . Marital Status: Single    Spouse Name: N/A    Number of Children: 3  . Years of Education: N/A   Occupational History  . Student     Full time [employer]   Social History Main Topics  . Smoking status: Current Every Day Smoker -- 0.50 packs/day for 17 years    Types: Cigarettes  . Smokeless tobacco: Never Used  . Alcohol Use: No  . Drug Use: No  . Sexual Activity: Not Currently    Partners: Male   Other Topics Concern  . Not on file   Social History Narrative  . No narrative on file    Current Outpatient Prescriptions on File Prior to Visit  Medication Sig Dispense Refill  . [DISCONTINUED] venlafaxine (EFFEXOR XR) 37.5 MG 24 hr capsule Take 1 capsule (37.5 mg total) by mouth daily.  30 capsule  1   No current facility-administered medications on file prior to visit.    Allergies  Allergen Reactions  . Cephalexin Diarrhea and Nausea And Vomiting    Review of Systems  Review of Systems  Constitutional: Negative for  fever and malaise/fatigue.  HENT: Negative for congestion.   Eyes: Negative for discharge.  Respiratory: Negative for shortness of breath.   Cardiovascular: Negative for chest pain, palpitations and leg swelling.  Gastrointestinal: Negative for nausea, abdominal pain and diarrhea.  Genitourinary: Negative for dysuria.  Musculoskeletal: Negative for falls.  Skin: Negative for rash.  Neurological: Negative for loss of consciousness and headaches.  Endo/Heme/Allergies: Negative for polydipsia.  Psychiatric/Behavioral: Negative for depression and suicidal ideas. The patient is not nervous/anxious and does not have insomnia.     Objective  BP 142/90  Pulse 114  Temp(Src) 97.9 F (36.6 C) (Oral)  Ht 5\' 6"  (1.676 m)  Wt 120 lb 1.3 oz (54.468 kg)  BMI 19.39 kg/m2  SpO2 98%  LMP 01/10/2014  Physical Exam  Physical Exam  Constitutional:  She is oriented to person, place, and time and well-developed, well-nourished, and in no distress. No distress.  HENT:  Head: Normocephalic and atraumatic.  Eyes: Conjunctivae are normal.  Neck: Neck supple. No thyromegaly present.  Cardiovascular: Normal rate, regular rhythm and normal heart sounds.   No murmur heard. Pulmonary/Chest: Effort normal and breath sounds normal. She has no wheezes.  Abdominal: She exhibits no distension and no mass.  Musculoskeletal: She exhibits no edema.  Lymphadenopathy:    She has no cervical adenopathy.  Neurological: She is alert and oriented to person, place, and time.  Skin: Skin is warm and dry. No rash noted. She is not diaphoretic.  Psychiatric: Memory, affect and judgment normal.    Lab Results  Component Value Date   TSH 1.218 11/21/2011   Lab Results  Component Value Date   WBC 10.3 07/03/2013   HGB 10.2* 07/03/2013   HCT 29.5* 07/03/2013   MCV 92.5 07/03/2013   PLT 205 07/03/2013   Lab Results  Component Value Date   CREATININE 0.82 11/21/2011   BUN 17 11/21/2011   NA 140 11/21/2011   K 4.5 11/21/2011   CL 107 11/21/2011   CO2 23 11/21/2011   Lab Results  Component Value Date   ALT 11 11/21/2011   AST 13 11/21/2011   ALKPHOS 118* 11/21/2011   BILITOT 0.6 11/21/2011   Lab Results  Component Value Date   CHOL 140 09/13/2010   Lab Results  Component Value Date   HDL 46.10 09/13/2010   Lab Results  Component Value Date   LDLCALC 88 09/13/2010   Lab Results  Component Value Date   TRIG 29.0 09/13/2010   Lab Results  Component Value Date   CHOLHDL 3 09/13/2010     Assessment & Plan  TOBACCO ABUSE Encouraged complete cessation. Discussed need to quit as relates to risk of numerous cancers, cardiac and pulmonary disease as well as neurologic complications. Counseled for greater than 3 minutes  UPPER RESPIRATORY INFECTION Encouraged increased rest and hydration, add probiotics, zinc such as Coldeze or Xicam. Treat fevers as  needed. If symptoms worsen given a paper copy of an antibiotic to try  VERTEBRAL FRACTURE, CERVICAL SPINE Given refills on current pain medications.  Anxiety and depression Doing well allowed refill on Klonopin

## 2014-01-24 NOTE — Assessment & Plan Note (Signed)
Encouraged increased rest and hydration, add probiotics, zinc such as Coldeze or Xicam. Treat fevers as needed. If symptoms worsen given a paper copy of an antibiotic to try

## 2014-02-17 ENCOUNTER — Encounter: Payer: Self-pay | Admitting: Family Medicine

## 2014-02-17 ENCOUNTER — Other Ambulatory Visit: Payer: Self-pay | Admitting: Family Medicine

## 2014-02-17 DIAGNOSIS — R52 Pain, unspecified: Secondary | ICD-10-CM

## 2014-02-17 NOTE — Telephone Encounter (Signed)
Please advise refill? Last RX was done on 01-23-14 quantity 120 with 0 refills?

## 2014-02-18 MED ORDER — OXYCODONE HCL 15 MG PO TABS: 15.0000 mg | ORAL_TABLET | Freq: Four times a day (QID) | ORAL | Status: DC | PRN

## 2014-02-18 NOTE — Telephone Encounter (Signed)
rx printed for md to sign and then will be put at front desk

## 2014-03-18 ENCOUNTER — Other Ambulatory Visit: Payer: Self-pay | Admitting: Family Medicine

## 2014-03-18 DIAGNOSIS — R52 Pain, unspecified: Secondary | ICD-10-CM

## 2014-03-18 MED ORDER — OXYCODONE HCL 15 MG PO TABS: 15.0000 mg | ORAL_TABLET | Freq: Four times a day (QID) | ORAL | Status: DC | PRN

## 2014-03-18 NOTE — Telephone Encounter (Signed)
RX printed for md to sign. Last RX was done on 02-18-14

## 2014-04-22 ENCOUNTER — Other Ambulatory Visit: Payer: Self-pay | Admitting: Family Medicine

## 2014-04-22 DIAGNOSIS — R52 Pain, unspecified: Secondary | ICD-10-CM

## 2014-04-22 MED ORDER — OXYCODONE HCL 15 MG PO TABS: 15.0000 mg | ORAL_TABLET | Freq: Four times a day (QID) | ORAL | Status: DC | PRN

## 2014-04-22 NOTE — Telephone Encounter (Signed)
Last RX was wrote on 03-18-14.  RX printed and put on mds desk to put at front desk.  My chart message sent to patient

## 2014-05-16 ENCOUNTER — Telehealth: Payer: Self-pay | Admitting: Family Medicine

## 2014-05-16 DIAGNOSIS — R52 Pain, unspecified: Secondary | ICD-10-CM

## 2014-05-20 MED ORDER — OXYCODONE HCL 15 MG PO TABS: 15.0000 mg | ORAL_TABLET | Freq: Four times a day (QID) | ORAL | Status: DC | PRN

## 2014-05-20 NOTE — Telephone Encounter (Signed)
Last RX done on 04-22-14  RX printed for md to sign and pt to pick up  Will notify pt through [employer]

## 2014-05-20 NOTE — Addendum Note (Signed)
Addended by: Varney Daily on: 05/20/2014 04:37 PM   Modules accepted: Orders

## 2014-06-13 ENCOUNTER — Telehealth: Payer: Self-pay | Admitting: Family Medicine

## 2014-06-13 NOTE — Telephone Encounter (Signed)
Refill- ondansetron  [facility]

## 2014-06-16 ENCOUNTER — Encounter: Payer: Self-pay | Admitting: Family Medicine

## 2014-06-16 ENCOUNTER — Ambulatory Visit (INDEPENDENT_AMBULATORY_CARE_PROVIDER_SITE_OTHER): Payer: Medicaid Other | Admitting: Family Medicine

## 2014-06-16 VITALS — BP 118/62 | HR 81 | Temp 98.2°F | Ht 66.0 in | Wt 123.0 lb

## 2014-06-16 DIAGNOSIS — K219 Gastro-esophageal reflux disease without esophagitis: Secondary | ICD-10-CM

## 2014-06-16 DIAGNOSIS — R5381 Other malaise: Secondary | ICD-10-CM

## 2014-06-16 DIAGNOSIS — F988 Other specified behavioral and emotional disorders with onset usually occurring in childhood and adolescence: Secondary | ICD-10-CM

## 2014-06-16 DIAGNOSIS — F411 Generalized anxiety disorder: Secondary | ICD-10-CM

## 2014-06-16 DIAGNOSIS — R52 Pain, unspecified: Secondary | ICD-10-CM

## 2014-06-16 DIAGNOSIS — F17200 Nicotine dependence, unspecified, uncomplicated: Secondary | ICD-10-CM

## 2014-06-16 DIAGNOSIS — R112 Nausea with vomiting, unspecified: Secondary | ICD-10-CM

## 2014-06-16 DIAGNOSIS — F341 Dysthymic disorder: Secondary | ICD-10-CM

## 2014-06-16 DIAGNOSIS — R41840 Attention and concentration deficit: Secondary | ICD-10-CM

## 2014-06-16 DIAGNOSIS — F419 Anxiety disorder, unspecified: Secondary | ICD-10-CM

## 2014-06-16 DIAGNOSIS — R5383 Other fatigue: Secondary | ICD-10-CM

## 2014-06-16 DIAGNOSIS — F329 Major depressive disorder, single episode, unspecified: Secondary | ICD-10-CM

## 2014-06-16 DIAGNOSIS — Z23 Encounter for immunization: Secondary | ICD-10-CM

## 2014-06-16 MED ORDER — CLONAZEPAM 0.5 MG PO TABS: 0.5000 mg | ORAL_TABLET | Freq: Two times a day (BID) | ORAL | Status: DC | PRN

## 2014-06-16 MED ORDER — AMPHETAMINE-DEXTROAMPHETAMINE 10 MG PO TABS: 10.0000 mg | ORAL_TABLET | Freq: Two times a day (BID) | ORAL | Status: DC

## 2014-06-16 MED ORDER — ONDANSETRON 4 MG PO TBDP: 4.0000 mg | ORAL_TABLET | Freq: Three times a day (TID) | ORAL | Status: DC | PRN

## 2014-06-16 MED ORDER — OXYCODONE HCL 15 MG PO TABS: 15.0000 mg | ORAL_TABLET | Freq: Four times a day (QID) | ORAL | Status: DC | PRN

## 2014-06-16 NOTE — Patient Instructions (Signed)

## 2014-06-16 NOTE — Telephone Encounter (Signed)
Please advise? Pt does have an appt this afternoon if you would like to address then?

## 2014-06-16 NOTE — Progress Notes (Signed)
Pre visit review using our clinic review tool, if applicable. No additional management support is needed unless otherwise documented below in the visit note. 

## 2014-06-16 NOTE — Telephone Encounter (Signed)
She can have a refill on the ondansetron same strenght, same sig, same #, 3 rf

## 2014-06-16 NOTE — Telephone Encounter (Signed)
Will send in visit

## 2014-06-18 ENCOUNTER — Encounter: Payer: Self-pay | Admitting: Family Medicine

## 2014-06-18 DIAGNOSIS — F988 Other specified behavioral and emotional disorders with onset usually occurring in childhood and adolescence: Secondary | ICD-10-CM | POA: Insufficient documentation

## 2014-06-18 HISTORY — DX: Other specified behavioral and emotional disorders with onset usually occurring in childhood and adolescence: F98.8

## 2014-06-18 NOTE — Assessment & Plan Note (Signed)
Had diagnosis and took meds in childhood, is back in school and struggling to stay focused. Is given an rx for Adderall to try and reassess at next visit. Referred to Behavioral health for further work up.

## 2014-06-18 NOTE — Progress Notes (Signed)
Patient ID: [name] [name], female   DOB: [birth year], 33 y.o.   MRN: [id number] BRALYN ESPINO [id number] 1981/09/10 06/18/2014      Progress Note-Follow Up  Subjective  Chief Complaint  Chief Complaint  Patient presents with  . Follow-up    would like labs and an xray  . Injections    flu    HPI  Patient is a 33 year old female in today for routine medical care. Patient is in today for followup. Has persistent back and neck pain but it is tolerable with medications. Is reporting she is back in school and in the process of getting her home set up his foster home. She's having trouble concentrating he notes as a child she was diagnosed with ADD and took medications to help her. Denies any recent illness. Denies headache, chest pain or palpitations. No shortness of breath. Is noting some intermittent heartburn and nausea and requesting a refill on Zofran.  Past Medical History  Diagnosis Date  . Mental disorder   . Headache(784.0)   . Preterm labor   . Fracture of spinal vertebra 2009    Surgery in 2011  . Seizures     Last seizure 2005  . Hemorrhoids thrombosed   . Anxiety   . Postpartum depression 11/27/2011  . PHARYNGITIS 10/13/2010    Qualifier: Diagnosis of  By: Charlett Blake MD, Erline Levine    . Acute bronchitis 10/13/2010    Qualifier: Diagnosis of  By: Charlett Blake MD, Erline Levine    . Anxiety and depression 11/27/2011  . Nausea & vomiting 07/10/2012  . Arthritis of spine 08/16/2010    Qualifier: Diagnosis of  By: Charlett Blake MD, Erline Levine    . Insomnia 07/13/2013  . ADD (attention deficit disorder) 06/18/2014    Past Surgical History  Procedure Laterality Date  . Cervical fusion  05/26/2011    She thinks it was C2    Family History  Problem Relation Age of Onset  . Arthritis Mother   . Hypertension Mother   . Coronary artery disease Father     s/p MIs/ smoker  . Alcohol abuse Father   . Heart attack Father   . Endometriosis Sister   . Aplastic anemia Maternal Grandmother   . Arthritis  Maternal Grandmother     s/p hip replacement for arthritis  . Anemia Maternal Grandmother     aplastic  . COPD Maternal Grandmother   . Heart disease Maternal Grandmother   . Scoliosis Maternal Grandfather     s/p surgeries  . Endometriosis Sister   . Cancer Sister     cervical  . Hypertension Sister   . Otitis media Daughter     History   Social History  . Marital Status: Single    Spouse Name: N/A    Number of Children: 3  . Years of Education: N/A   Occupational History  . Student     Full time [employer]   Social History Main Topics  . Smoking status: Current Every Day Smoker -- 0.50 packs/day for 17 years    Types: Cigarettes  . Smokeless tobacco: Never Used  . Alcohol Use: No  . Drug Use: No  . Sexual Activity: Not Currently    Partners: Male   Other Topics Concern  . Not on file   Social History Narrative  . No narrative on file    Current Outpatient Prescriptions on File Prior to Visit  Medication Sig Dispense Refill  . [DISCONTINUED] venlafaxine (EFFEXOR XR) 37.5 MG 24 hr  capsule Take 1 capsule (37.5 mg total) by mouth daily.  30 capsule  1   No current facility-administered medications on file prior to visit.    Allergies  Allergen Reactions  . Cephalexin Diarrhea and Nausea And Vomiting    Review of Systems  Review of Systems  Constitutional: Negative for fever and malaise/fatigue.  HENT: Negative for congestion.   Eyes: Negative for discharge.  Respiratory: Negative for shortness of breath.   Cardiovascular: Negative for chest pain, palpitations and leg swelling.  Gastrointestinal: Positive for heartburn and nausea. Negative for vomiting, abdominal pain and diarrhea.  Genitourinary: Negative for dysuria.  Musculoskeletal: Positive for back pain. Negative for falls.  Skin: Negative for rash.  Neurological: Negative for loss of consciousness and headaches.  Endo/Heme/Allergies: Negative for polydipsia.  Psychiatric/Behavioral: Positive for  depression. Negative for suicidal ideas. The patient is nervous/anxious. The patient does not have insomnia.     Objective  BP 118/62  Pulse 81  Temp(Src) 98.2 F (36.8 C) (Oral)  Ht 5\' 6"  (1.676 m)  Wt 123 lb (55.792 kg)  BMI 19.86 kg/m2  SpO2 98%  LMP 05/23/2014  Breastfeeding? No  Physical Exam  Physical Exam  Constitutional: She is oriented to person, place, and time and well-developed, well-nourished, and in no distress. No distress.  HENT:  Head: Normocephalic and atraumatic.  Eyes: Conjunctivae are normal.  Neck: Neck supple. No thyromegaly present.  Cardiovascular: Normal rate, regular rhythm and normal heart sounds.   No murmur heard. Pulmonary/Chest: Effort normal and breath sounds normal. She has no wheezes.  Abdominal: She exhibits no distension and no mass.  Genitourinary: Left adnexa normal.  Musculoskeletal: She exhibits no edema.  Lymphadenopathy:    She has no cervical adenopathy.  Neurological: She is alert and oriented to person, place, and time.  Skin: Skin is warm and dry. No rash noted. She is not diaphoretic.  Psychiatric: Memory, affect and judgment normal.    Lab Results  Component Value Date   TSH 1.218 11/21/2011   Lab Results  Component Value Date   WBC 10.3 07/03/2013   HGB 10.2* 07/03/2013   HCT 29.5* 07/03/2013   MCV 92.5 07/03/2013   PLT 205 07/03/2013   Lab Results  Component Value Date   CREATININE 0.82 11/21/2011   BUN 17 11/21/2011   NA 140 11/21/2011   K 4.5 11/21/2011   CL 107 11/21/2011   CO2 23 11/21/2011   Lab Results  Component Value Date   ALT 11 11/21/2011   AST 13 11/21/2011   ALKPHOS 118* 11/21/2011   BILITOT 0.6 11/21/2011   Lab Results  Component Value Date   CHOL 140 09/13/2010   Lab Results  Component Value Date   HDL 46.10 09/13/2010   Lab Results  Component Value Date   LDLCALC 88 09/13/2010   Lab Results  Component Value Date   TRIG 29.0 09/13/2010   Lab Results  Component Value Date   CHOLHDL 3  09/13/2010     Assessment & Plan  TOBACCO ABUSE Down to less than 1/4 ppd. Encouraged complete cessation  ADD (attention deficit disorder) Had diagnosis and took meds in childhood, is back in school and struggling to stay focused. Is given an rx for Adderall to try and reassess at next visit. Referred to Behavioral health for further work up.  Anxiety and depression Despite stressors is doing fairly well. May continue Clonazepam prn  Backache Chronic pain is tolerable. No changes today. Continue current meds  GERD without esophagitis  Avoid offending foods, start probiotics. Do not eat large meals in late evening and consider raising head of bed. Given refill on Zofran for intermittent nausea

## 2014-06-18 NOTE — Assessment & Plan Note (Signed)
Down to less than 1/4 ppd. Encouraged complete cessation

## 2014-06-18 NOTE — Assessment & Plan Note (Signed)
Despite stressors is doing fairly well. May continue Clonazepam prn

## 2014-06-18 NOTE — Assessment & Plan Note (Signed)
Chronic pain is tolerable. No changes today. Continue current meds

## 2014-06-18 NOTE — Assessment & Plan Note (Signed)
Avoid offending foods, start probiotics. Do not eat large meals in late evening and consider raising head of bed. Given refill on Zofran for intermittent nausea

## 2014-06-24 ENCOUNTER — Telehealth: Payer: Self-pay

## 2014-06-24 NOTE — Telephone Encounter (Signed)
Paperwork for [employer] put on MD's desk

## 2014-06-26 NOTE — Telephone Encounter (Signed)
Paperwork put in fax folder

## 2014-07-16 ENCOUNTER — Encounter: Payer: Self-pay | Admitting: Family Medicine

## 2014-07-16 ENCOUNTER — Other Ambulatory Visit: Payer: Self-pay | Admitting: Family Medicine

## 2014-07-16 DIAGNOSIS — R52 Pain, unspecified: Secondary | ICD-10-CM

## 2014-07-16 DIAGNOSIS — R5381 Other malaise: Secondary | ICD-10-CM

## 2014-07-16 DIAGNOSIS — R5383 Other fatigue: Secondary | ICD-10-CM

## 2014-07-16 DIAGNOSIS — R41840 Attention and concentration deficit: Secondary | ICD-10-CM

## 2014-07-17 MED ORDER — OXYCODONE HCL 15 MG PO TABS: 15.0000 mg | ORAL_TABLET | Freq: Four times a day (QID) | ORAL | Status: DC | PRN

## 2014-07-17 MED ORDER — AMPHETAMINE-DEXTROAMPHETAMINE 10 MG PO TABS: 10.0000 mg | ORAL_TABLET | Freq: Two times a day (BID) | ORAL | Status: DC

## 2014-08-01 ENCOUNTER — Ambulatory Visit: Payer: Self-pay | Admitting: Family Medicine

## 2014-08-15 ENCOUNTER — Other Ambulatory Visit: Payer: Self-pay | Admitting: Family Medicine

## 2014-08-15 ENCOUNTER — Telehealth: Payer: Self-pay | Admitting: Family Medicine

## 2014-08-15 ENCOUNTER — Encounter: Payer: Self-pay | Admitting: Family Medicine

## 2014-08-15 DIAGNOSIS — R52 Pain, unspecified: Secondary | ICD-10-CM

## 2014-08-15 NOTE — Telephone Encounter (Signed)
Please advise refill?  Pt no showed last appt:  Last RX refill we informed pt: Per md:  I am willing to let her have a one month supply of both meds to hold her til her October visit. Just this once

## 2014-08-17 NOTE — Telephone Encounter (Signed)
Needs appt for refill

## 2014-08-18 NOTE — Telephone Encounter (Signed)
I believe patient message was sent to me in error. We'll send this back to [facility] so she is scented to Dr. Randel Pigg.

## 2014-08-18 NOTE — Telephone Encounter (Signed)
I will inform patient through the mychart messages pt sent

## 2014-08-21 ENCOUNTER — Encounter: Payer: Self-pay | Admitting: Family Medicine

## 2014-08-21 ENCOUNTER — Ambulatory Visit (INDEPENDENT_AMBULATORY_CARE_PROVIDER_SITE_OTHER): Payer: Self-pay | Admitting: Family Medicine

## 2014-08-21 VITALS — BP 133/82 | HR 95 | Temp 98.0°F | Ht 66.0 in | Wt 130.4 lb

## 2014-08-21 DIAGNOSIS — F988 Other specified behavioral and emotional disorders with onset usually occurring in childhood and adolescence: Secondary | ICD-10-CM

## 2014-08-21 DIAGNOSIS — K219 Gastro-esophageal reflux disease without esophagitis: Secondary | ICD-10-CM

## 2014-08-21 DIAGNOSIS — J069 Acute upper respiratory infection, unspecified: Secondary | ICD-10-CM

## 2014-08-21 DIAGNOSIS — Z720 Tobacco use: Secondary | ICD-10-CM

## 2014-08-21 DIAGNOSIS — R5383 Other fatigue: Secondary | ICD-10-CM

## 2014-08-21 DIAGNOSIS — M549 Dorsalgia, unspecified: Secondary | ICD-10-CM

## 2014-08-21 DIAGNOSIS — R5381 Other malaise: Secondary | ICD-10-CM

## 2014-08-21 DIAGNOSIS — F17200 Nicotine dependence, unspecified, uncomplicated: Secondary | ICD-10-CM

## 2014-08-21 DIAGNOSIS — R52 Pain, unspecified: Secondary | ICD-10-CM

## 2014-08-21 DIAGNOSIS — F909 Attention-deficit hyperactivity disorder, unspecified type: Secondary | ICD-10-CM

## 2014-08-21 DIAGNOSIS — R41840 Attention and concentration deficit: Secondary | ICD-10-CM

## 2014-08-21 MED ORDER — OXYCODONE HCL 15 MG PO TABS: 15.0000 mg | ORAL_TABLET | Freq: Four times a day (QID) | ORAL | Status: DC | PRN

## 2014-08-21 MED ORDER — AMPHETAMINE-DEXTROAMPHETAMINE 10 MG PO TABS: 10.0000 mg | ORAL_TABLET | Freq: Two times a day (BID) | ORAL | Status: DC

## 2014-08-21 MED ORDER — ALBUTEROL SULFATE HFA 108 (90 BASE) MCG/ACT IN AERS: 2.0000 | INHALATION_SPRAY | Freq: Four times a day (QID) | RESPIRATORY_TRACT | Status: DC | PRN

## 2014-08-21 MED ORDER — SULFAMETHOXAZOLE-TMP DS 800-160 MG PO TABS: 1.0000 | ORAL_TABLET | Freq: Two times a day (BID) | ORAL | Status: DC

## 2014-08-21 NOTE — Patient Instructions (Addendum)
mucinex twice a day and probiotic daily , generic   Acute Bronchitis Bronchitis is inflammation of the airways that extend from the windpipe into the lungs (bronchi). The inflammation often causes mucus to develop. This leads to a cough, which is the most common symptom of bronchitis.  In acute bronchitis, the condition usually develops suddenly and goes away over time, usually in a couple weeks. Smoking, allergies, and asthma can make bronchitis worse. Repeated episodes of bronchitis may cause further lung problems.  CAUSES Acute bronchitis is most often caused by the same virus that causes a cold. The virus can spread from person to person (contagious) through coughing, sneezing, and touching contaminated objects. SIGNS AND SYMPTOMS   Cough.   Fever.   Coughing up mucus.   Body aches.   Chest congestion.   Chills.   Shortness of breath.   Sore throat.  DIAGNOSIS  Acute bronchitis is usually diagnosed through a physical exam. Your health care provider will also ask you questions about your medical history. Tests, such as chest X-rays, are sometimes done to rule out other conditions.  TREATMENT  Acute bronchitis usually goes away in a couple weeks. Oftentimes, no medical treatment is necessary. Medicines are sometimes given for relief of fever or cough. Antibiotic medicines are usually not needed but may be prescribed in certain situations. In some cases, an inhaler may be recommended to help reduce shortness of breath and control the cough. A cool mist vaporizer may also be used to help thin bronchial secretions and make it easier to clear the chest.  HOME CARE INSTRUCTIONS  Get plenty of rest.   Drink enough fluids to keep your urine clear or pale yellow (unless you have a medical condition that requires fluid restriction). Increasing fluids may help thin your respiratory secretions (sputum) and reduce chest congestion, and it will prevent dehydration.   Take medicines  only as directed by your health care provider.  If you were prescribed an antibiotic medicine, finish it all even if you start to feel better.  Avoid smoking and secondhand smoke. Exposure to cigarette smoke or irritating chemicals will make bronchitis worse. If you are a smoker, consider using nicotine gum or skin patches to help control withdrawal symptoms. Quitting smoking will help your lungs heal faster.   Reduce the chances of another bout of acute bronchitis by washing your hands frequently, avoiding people with cold symptoms, and trying not to touch your hands to your mouth, nose, or eyes.   Keep all follow-up visits as directed by your health care provider.  SEEK MEDICAL CARE IF: Your symptoms do not improve after 1 week of treatment.  SEEK IMMEDIATE MEDICAL CARE IF:  You develop an increased fever or chills.   You have chest pain.   You have severe shortness of breath.  You have bloody sputum.   You develop dehydration.  You faint or repeatedly feel like you are going to pass out.  You develop repeated vomiting.  You develop a severe headache. MAKE SURE YOU:   Understand these instructions.  Will watch your condition.  Will get help right away if you are not doing well or get worse. Document Released: 11/17/2004 Document Revised: 02/24/2014 Document Reviewed: 04/02/2013 [facility] Patient Information 2015 [place], [place]. This information is not intended to replace advice given to you by your health care provider. Make sure you discuss any questions you have with your health care provider.

## 2014-08-21 NOTE — Progress Notes (Signed)
Pre visit review using our clinic review tool, if applicable. No additional management support is needed unless otherwise documented below in the visit note. 

## 2014-08-22 ENCOUNTER — Telehealth: Payer: Self-pay | Admitting: Family Medicine

## 2014-08-22 NOTE — Telephone Encounter (Signed)
emmi emailed °

## 2014-08-25 ENCOUNTER — Encounter: Payer: Self-pay | Admitting: Family Medicine

## 2014-08-31 ENCOUNTER — Encounter: Payer: Self-pay | Admitting: Family Medicine

## 2014-08-31 NOTE — Progress Notes (Signed)
Patient ID: [name] [name], female   DOB: [birth year], 33 y.o.   MRN: [id number] [name] [name] [id number] [birth year] 08/31/2014      Progress Note-Follow Up  Subjective  Chief Complaint  Chief Complaint  Patient presents with  . Follow-up    HPI  Patient is a 33 year old female in today for routine medical care. Has quit smoking since her last visit. Unfortunately she is now struggling with increased congestion, cough and some wheezing. She continues to struggle with fatigue but acknowledges this is largely related to childcare, school and all the work she has done to get her home certified as a [facility], which she reports has happened. Denies CP/palp/SOB/HA/fevers/GI or GU c/o. Taking meds as prescribed  Past Medical History  Diagnosis Date  . Mental disorder   . Headache(784.0)   . Preterm labor   . Fracture of spinal vertebra 2009    Surgery in 2011  . Seizures     Last seizure 2005  . Hemorrhoids thrombosed   . Anxiety   . Postpartum depression 11/27/2011  . PHARYNGITIS 10/13/2010    Qualifier: Diagnosis of  By: Charlett Blake MD, Erline Levine    . Acute bronchitis 10/13/2010    Qualifier: Diagnosis of  By: Charlett Blake MD, Erline Levine    . Anxiety and depression 11/27/2011  . Nausea & vomiting 07/10/2012  . Arthritis of spine 08/16/2010    Qualifier: Diagnosis of  By: Charlett Blake MD, Erline Levine    . Insomnia 07/13/2013  . ADD (attention deficit disorder) 06/18/2014    Past Surgical History  Procedure Laterality Date  . Cervical fusion  05/26/2011    She thinks it was C2    Family History  Problem Relation Age of Onset  . Arthritis Mother   . Hypertension Mother   . Coronary artery disease Father     s/p MIs/ smoker  . Alcohol abuse Father   . Heart attack Father   . Endometriosis Sister   . Aplastic anemia Maternal Grandmother   . Arthritis Maternal Grandmother     s/p hip replacement for arthritis  . Anemia Maternal Grandmother     aplastic  . COPD Maternal Grandmother   . Heart  disease Maternal Grandmother   . Scoliosis Maternal Grandfather     s/p surgeries  . Endometriosis Sister   . Cancer Sister     cervical  . Hypertension Sister   . Otitis media Daughter     History   Social History  . Marital Status: Single    Spouse Name: N/A    Number of Children: 3  . Years of Education: N/A   Occupational History  . Student     Full time [employer]   Social History Main Topics  . Smoking status: Current Every Day Smoker -- 0.50 packs/day for 17 years    Types: Cigarettes  . Smokeless tobacco: Never Used  . Alcohol Use: No  . Drug Use: No  . Sexual Activity:    Partners: Male   Other Topics Concern  . Not on file   Social History Narrative    Current Outpatient Prescriptions on File Prior to Visit  Medication Sig Dispense Refill  . clonazePAM (KLONOPIN) 0.5 MG tablet Take 1 tablet (0.5 mg total) by mouth 2 (two) times daily as needed for anxiety. 60 tablet 3  . ondansetron (ZOFRAN ODT) 4 MG disintegrating tablet Take 1 tablet (4 mg total) by mouth every 8 (eight) hours as needed for nausea. 40 tablet 3  . [  DISCONTINUED] venlafaxine (EFFEXOR XR) 37.5 MG 24 hr capsule Take 1 capsule (37.5 mg total) by mouth daily. 30 capsule 1   No current facility-administered medications on file prior to visit.    Allergies  Allergen Reactions  . Cephalexin Diarrhea and Nausea And Vomiting    Review of Systems  Review of Systems  Constitutional: Negative for fever and malaise/fatigue.  HENT: Positive for congestion.   Eyes: Negative for discharge.  Respiratory: Positive for cough, sputum production and wheezing. Negative for shortness of breath.   Cardiovascular: Negative for chest pain, palpitations and leg swelling.  Gastrointestinal: Negative for nausea, abdominal pain and diarrhea.  Genitourinary: Negative for dysuria.  Musculoskeletal: Positive for back pain. Negative for falls.  Skin: Negative for rash.  Neurological: Negative for loss of  consciousness and headaches.  Endo/Heme/Allergies: Negative for polydipsia.  Psychiatric/Behavioral: Negative for depression and suicidal ideas. The patient is not nervous/anxious and does not have insomnia.     Objective  BP 133/82 mmHg  Pulse 95  Temp(Src) 98 F (36.7 C) (Oral)  Ht 5\' 6"  (1.676 m)  Wt 130 lb 6.4 oz (59.149 kg)  BMI 21.06 kg/m2  SpO2 100%  LMP 08/20/2014  Breastfeeding? No  Physical Exam  Physical Exam  Constitutional: She is oriented to person, place, and time and well-developed, well-nourished, and in no distress. No distress.  HENT:  Head: Normocephalic and atraumatic.  Eyes: Conjunctivae are normal.  Neck: Neck supple. No thyromegaly present.  Cardiovascular: Normal rate, regular rhythm and normal heart sounds.   No murmur heard. Pulmonary/Chest: Effort normal and breath sounds normal. She has no wheezes.  Rhonchi LLL  Abdominal: She exhibits no distension and no mass.  Musculoskeletal: She exhibits no edema.  Lymphadenopathy:    She has no cervical adenopathy.  Neurological: She is alert and oriented to person, place, and time.  Skin: Skin is warm and dry. No rash noted. She is not diaphoretic.  Psychiatric: Memory, affect and judgment normal.    Lab Results  Component Value Date   TSH 1.218 11/21/2011   Lab Results  Component Value Date   WBC 10.3 07/03/2013   HGB 10.2* 07/03/2013   HCT 29.5* 07/03/2013   MCV 92.5 07/03/2013   PLT 205 07/03/2013   Lab Results  Component Value Date   CREATININE 0.82 11/21/2011   BUN 17 11/21/2011   NA 140 11/21/2011   K 4.5 11/21/2011   CL 107 11/21/2011   CO2 23 11/21/2011   Lab Results  Component Value Date   ALT 11 11/21/2011   AST 13 11/21/2011   ALKPHOS 118* 11/21/2011   BILITOT 0.6 11/21/2011   Lab Results  Component Value Date   CHOL 140 09/13/2010   Lab Results  Component Value Date   HDL 46.10 09/13/2010   Lab Results  Component Value Date   LDLCALC 88 09/13/2010   Lab  Results  Component Value Date   TRIG 29.0 09/13/2010   Lab Results  Component Value Date   CHOLHDL 3 09/13/2010     Assessment & Plan  Acute upper respiratory infection Encouraged increased rest and hydration, add probiotics, zinc such as Coldeze or Xicam. Treat fevers as needed. Started on Bactrim and Mucinex  GERD without esophagitis Avoid offending foods, start probiotics. Do not eat large meals in late evening and consider raising head of bed.   ADD (attention deficit disorder) Good response to Adderall will continue same for now  TOBACCO ABUSE Reports has stopped smoking since last visit.

## 2014-08-31 NOTE — Assessment & Plan Note (Signed)
Encouraged increased rest and hydration, add probiotics, zinc such as Coldeze or Xicam. Treat fevers as needed. Started on Bactrim and Mucinex

## 2014-08-31 NOTE — Assessment & Plan Note (Signed)
Good response to Adderall will continue same for now

## 2014-08-31 NOTE — Assessment & Plan Note (Signed)
Reports has stopped smoking since last visit.

## 2014-08-31 NOTE — Assessment & Plan Note (Signed)
Avoid offending foods, start probiotics. Do not eat large meals in late evening and consider raising head of bed.  

## 2014-09-01 ENCOUNTER — Ambulatory Visit: Payer: Self-pay | Admitting: Family Medicine

## 2014-09-11 ENCOUNTER — Other Ambulatory Visit: Payer: Self-pay | Admitting: Family Medicine

## 2014-09-11 DIAGNOSIS — R52 Pain, unspecified: Secondary | ICD-10-CM

## 2014-09-12 MED ORDER — OXYCODONE HCL 15 MG PO TABS: 15.0000 mg | ORAL_TABLET | Freq: Four times a day (QID) | ORAL | Status: DC | PRN

## 2014-09-12 NOTE — Telephone Encounter (Signed)
RX was done on 08-21-14  Pt can pick up on 08-17-14 but can't fill until 09-19-14

## 2014-10-13 ENCOUNTER — Encounter: Payer: Self-pay | Admitting: Family Medicine

## 2014-10-13 ENCOUNTER — Other Ambulatory Visit: Payer: Self-pay | Admitting: Family Medicine

## 2014-10-13 DIAGNOSIS — R52 Pain, unspecified: Secondary | ICD-10-CM

## 2014-10-14 NOTE — Telephone Encounter (Signed)
Pt is following up to see if she will be able to pick up the rx before leaving to go out of town

## 2014-10-15 ENCOUNTER — Telehealth: Payer: Self-pay | Admitting: Family Medicine

## 2014-10-15 DIAGNOSIS — R52 Pain, unspecified: Secondary | ICD-10-CM

## 2014-10-16 MED ORDER — OXYCODONE HCL 15 MG PO TABS: 15.0000 mg | ORAL_TABLET | Freq: Four times a day (QID) | ORAL | Status: DC | PRN

## 2014-10-16 NOTE — Telephone Encounter (Signed)
LMOM @ 12:05pm @ [phone]) asking the pt to RTC regarding med refill.//AB/CMA

## 2014-10-16 NOTE — Telephone Encounter (Signed)
I am OK to refill this

## 2014-10-16 NOTE — Telephone Encounter (Signed)
Requesting: oxyCODONE (ROXICODONE) 15 MG immediate release tablet  UDS not on file [facility] contact 07/2013 Last OV 08/21/2014 Last Refill 09/12/2014 not to be filled before 09/19/2014 (#120)  Please Advise

## 2014-10-16 NOTE — Telephone Encounter (Signed)
Patient calling in regarding this, needs this as soon as possible. Leaving town today.

## 2014-10-16 NOTE — Telephone Encounter (Signed)
Rx printed and placed for signature 

## 2014-10-20 NOTE — Telephone Encounter (Signed)
LMOM @ 8:54am @ [phone]) asking the pt to RTC regarding med refill.//AB/CMA

## 2014-10-20 NOTE — Telephone Encounter (Signed)
Rx was denied filled on (10/16/14).//AB/CMA

## 2014-10-20 NOTE — Telephone Encounter (Signed)
Rx printed and signed and the pt came to pick up the prescription.//AB/CMA

## 2014-10-24 NOTE — L&D Delivery Note (Cosign Needed)
Patient is 34 y.o. 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 [place] h/o tobacco use during pregnacny, depression and anxiety disorder, and late [facility].  Delivery Note At 7:57 AM a viable female was delivered via Vaginal, Spontaneous Delivery (Presentation: Left Occiput Anterior).  APGAR: 8, 9; weight pending.   Placenta status: Intact, Spontaneous.  Cord: 3 vessels with the following complications: None.    Anesthesia: Epidural  Episiotomy: None Lacerations:  None Est. Blood Loss (mL):  200  Mom to postpartum.  Baby to Couplet care / Skin to Skin.   Luiz Blare, DO 06/18/2015, 8:22 AM PGY-2, [facility]  Patient is a [id number] at [redacted]w[redacted]d who was admitted w/ SOL, significant hx of methadone use, depression & anxiety.  She progressed without augmentation.  I was gloved and present for delivery in its entirety.  Second stage of labor progressed  Complications: none  Lacerations: none   Serita Grammes, CNM 9:07 AM 06/18/2015

## 2014-11-04 ENCOUNTER — Ambulatory Visit: Payer: Self-pay | Admitting: Family Medicine

## 2014-11-11 ENCOUNTER — Ambulatory Visit: Payer: Medicaid Other | Admitting: Family Medicine

## 2014-11-11 NOTE — Telephone Encounter (Signed)
Harl Bowie, CMA at 10/20/2014 2:56 PM     Status: Signed       Expand All Collapse All   Rx printed and signed and the pt came to pick up the prescription.//AB/CMA

## 2014-11-18 ENCOUNTER — Ambulatory Visit (INDEPENDENT_AMBULATORY_CARE_PROVIDER_SITE_OTHER): Payer: Self-pay | Admitting: Family Medicine

## 2014-11-18 ENCOUNTER — Encounter: Payer: Self-pay | Admitting: Family Medicine

## 2014-11-18 VITALS — BP 119/73 | HR 86 | Temp 98.1°F | Resp 18 | Ht 66.0 in | Wt 136.0 lb

## 2014-11-18 DIAGNOSIS — R5381 Other malaise: Secondary | ICD-10-CM

## 2014-11-18 DIAGNOSIS — R52 Pain, unspecified: Secondary | ICD-10-CM

## 2014-11-18 DIAGNOSIS — F988 Other specified behavioral and emotional disorders with onset usually occurring in childhood and adolescence: Secondary | ICD-10-CM

## 2014-11-18 DIAGNOSIS — Z720 Tobacco use: Secondary | ICD-10-CM

## 2014-11-18 DIAGNOSIS — R5383 Other fatigue: Secondary | ICD-10-CM

## 2014-11-18 DIAGNOSIS — S129XXS Fracture of neck, unspecified, sequela: Secondary | ICD-10-CM

## 2014-11-18 DIAGNOSIS — F410 Panic disorder [episodic paroxysmal anxiety] without agoraphobia: Secondary | ICD-10-CM

## 2014-11-18 DIAGNOSIS — R41840 Attention and concentration deficit: Secondary | ICD-10-CM

## 2014-11-18 DIAGNOSIS — G43909 Migraine, unspecified, not intractable, without status migrainosus: Secondary | ICD-10-CM

## 2014-11-18 DIAGNOSIS — F909 Attention-deficit hyperactivity disorder, unspecified type: Secondary | ICD-10-CM

## 2014-11-18 DIAGNOSIS — F17200 Nicotine dependence, unspecified, uncomplicated: Secondary | ICD-10-CM

## 2014-11-18 MED ORDER — OXYCODONE HCL 15 MG PO TABS: 15.0000 mg | ORAL_TABLET | Freq: Four times a day (QID) | ORAL | Status: DC | PRN

## 2014-11-18 MED ORDER — AMPHETAMINE-DEXTROAMPHETAMINE 10 MG PO TABS: 10.0000 mg | ORAL_TABLET | Freq: Two times a day (BID) | ORAL | Status: DC

## 2014-11-18 NOTE — Assessment & Plan Note (Signed)
Good response to low dose adderall, no concerning side effects and able to concentrate and get her homework done for her 4 classes. Allowed refills today

## 2014-11-18 NOTE — Assessment & Plan Note (Signed)
Doing well on current pain meds despite carrying around her small children and being back in school on computer. Allowed refills today and encouraged to minimize pain med use when possible

## 2014-11-18 NOTE — Assessment & Plan Note (Signed)
Doing much better no recent panic attack is using Clonazepam very infrequently

## 2014-11-18 NOTE — Patient Instructions (Signed)
Nicotine Addiction Nicotine can act as both a stimulant (excites/activates) and a sedative (calms/quiets). Immediately after exposure to nicotine, there is a "kick" caused in part by the drug's stimulation of the adrenal glands and resulting discharge of adrenaline (epinephrine). The rush of adrenaline stimulates the body and causes a sudden release of sugar. This means that smokers are always slightly hyperglycemic. Hyperglycemic means that the blood sugar is high, just like in diabetics. Nicotine also decreases the amount of insulin which helps control sugar levels in the body. There is an increase in blood pressure, breathing, and the rate of heart beats.  In addition, nicotine indirectly causes a release of dopamine in the brain that controls pleasure and motivation. A similar reaction is seen with other drugs of abuse, such as cocaine and heroin. This dopamine release is thought to cause the pleasurable sensations when smoking. In some different cases, nicotine can also create a calming effect, depending on sensitivity of the smoker's nervous system and the dose of nicotine taken. WHAT HAPPENS WHEN NICOTINE IS TAKEN FOR LONG PERIODS OF TIME?  Long-term use of nicotine results in addiction. It is difficult to stop.  Repeated use of nicotine creates tolerance. Higher doses of nicotine are needed to get the "kick." When nicotine use is stopped, withdrawal may last a month or more. Withdrawal may begin within a few hours after the last cigarette. Symptoms peak within the first few days and may lessen within a few weeks. For some people, however, symptoms may last for months or longer. Withdrawal symptoms include:   Irritability.  Craving.  Learning and attention deficits.  Sleep disturbances.  Increased appetite. Craving for tobacco may last for 6 months or longer. Many behaviors done while using nicotine can also play a part in the severity of withdrawal symptoms. For some people, the feel,  smell, and sight of a cigarette and the ritual of obtaining, handling, lighting, and smoking the cigarette are closely linked with the pleasure of smoking. When stopped, they also miss the related behaviors which make the withdrawal or craving worse. While nicotine gum and patches may lessen the drug aspects of withdrawal, cravings often persist. WHAT ARE THE MEDICAL CONSEQUENCES OF NICOTINE USE?  Nicotine addiction accounts for one-third of all cancers. The top cancer caused by tobacco is lung cancer. Lung cancer is the number one cancer killer of both men and women.  Smoking is also associated with cancers of the:  Mouth.  Pharynx.  Larynx.  Esophagus.  Stomach.  Pancreas.  Cervix.  Kidney.  Ureter.  Bladder.  Smoking also causes lung diseases such as lasting (chronic) bronchitis and emphysema.  It worsens asthma in adults and children.  Smoking increases the risk of heart disease, including:  Stroke.  Heart attack.  Vascular disease.  Aneurysm.  Passive or secondary smoke can also increase medical risks including:  Asthma in children.  Sudden Infant Death Syndrome (SIDS).  Additionally, dropped cigarettes are the leading cause of residential fire fatalities.  Nicotine poisoning has been reported from accidental ingestion of tobacco products by children and pets. Death usually results in a few minutes from respiratory failure (when a person stops breathing) caused by paralysis. TREATMENT   Medication. Nicotine replacement medicines such as nicotine gum and the patch are used to stop smoking. These medicines gradually lower the dosage of nicotine in the body. These medicines do not contain the carbon monoxide and other toxins found in tobacco smoke.  Hypnotherapy.  Relaxation therapy.  Nicotine Anonymous (a 12-step support   program). Find times and locations in your local yellow pages. Document Released: 06/15/2004 Document Revised: 01/02/2012 Document  Reviewed: 12/06/2013 ExitCare Patient Information 2015 [employer]. This information is not intended to replace advice given to you by your health care provider. Make sure you discuss any questions you have with your health care provider.  

## 2014-11-18 NOTE — Progress Notes (Signed)
Pre visit review using our clinic review tool, if applicable. No additional management support is needed unless otherwise documented below in the visit note. 

## 2014-11-18 NOTE — Assessment & Plan Note (Signed)
No flares with being back in school or addition of adderall

## 2014-11-18 NOTE — Progress Notes (Signed)
Patient ID: [name] [name], female   DOB: 1981-07-28, 34 y.o.   MRN: [id number]   SHARIAN DELIA  [id number] Jan 23, 1981 11/18/2014      Progress Note-Follow Up  Subjective  Chief Complaint  Chief Complaint  Patient presents with  . Follow-up    adderall helps with being able to focus and become more organized with studying for school    HPI  Patient is a 34 y.o. female in today for routine medical care. Doing well on new Adderall prescription. Able to concentrate for her 4 classes and get her work done. Has been licensed for foster care provider. No HA, panic attacks, palp, cp, insomnia or acute concerns. No recent illness down to 1/4 ppd. Denies CP/palp/SOB/HA/congestion/fevers/GI or GU c/o. Taking meds as prescribed  Past Medical History  Diagnosis Date  . Mental disorder   . Headache(784.0)   . Preterm labor   . Fracture of spinal vertebra 2009    Surgery in 2011  . Seizures     Last seizure 2005  . Hemorrhoids thrombosed   . Anxiety   . Postpartum depression 11/27/2011  . PHARYNGITIS 10/13/2010    Qualifier: Diagnosis of  By: Charlett Blake MD, Erline Levine    . Acute bronchitis 10/13/2010    Qualifier: Diagnosis of  By: Charlett Blake MD, Erline Levine    . Anxiety and depression 11/27/2011  . Nausea & vomiting 07/10/2012  . Arthritis of spine 08/16/2010    Qualifier: Diagnosis of  By: Charlett Blake MD, Erline Levine    . Insomnia 07/13/2013  . ADD (attention deficit disorder) 06/18/2014    Past Surgical History  Procedure Laterality Date  . Cervical fusion  05/26/2011    She thinks it was C2    Family History  Problem Relation Age of Onset  . Arthritis Mother   . Hypertension Mother   . Coronary artery disease Father     s/p MIs/ smoker  . Alcohol abuse Father   . Heart attack Father   . Endometriosis Sister   . Aplastic anemia Maternal Grandmother   . Arthritis Maternal Grandmother     s/p hip replacement for arthritis  . Anemia Maternal Grandmother     aplastic  . COPD Maternal Grandmother     . Heart disease Maternal Grandmother   . Scoliosis Maternal Grandfather     s/p surgeries  . Endometriosis Sister   . Cancer Sister     cervical  . Hypertension Sister   . Otitis media Daughter     History   Social History  . Marital Status: Single    Spouse Name: N/A    Number of Children: 3  . Years of Education: N/A   Occupational History  . Student     Full time [employer]   Social History Main Topics  . Smoking status: Current Every Day Smoker -- 0.50 packs/day for 17 years    Types: Cigarettes  . Smokeless tobacco: Never Used  . Alcohol Use: No  . Drug Use: No  . Sexual Activity:    Partners: Male   Other Topics Concern  . Not on file   Social History Narrative    Current Outpatient Prescriptions on File Prior to Visit  Medication Sig Dispense Refill  . albuterol (PROVENTIL HFA;VENTOLIN HFA) 108 (90 BASE) MCG/ACT inhaler Inhale 2 puffs into the lungs every 6 (six) hours as needed for wheezing or shortness of breath. 1 Inhaler 2  . amphetamine-dextroamphetamine (ADDERALL) 10 MG tablet Take 1 tablet (10 mg total) by mouth  2 (two) times daily. December 2015 60 tablet 0  . clonazePAM (KLONOPIN) 0.5 MG tablet Take 1 tablet (0.5 mg total) by mouth 2 (two) times daily as needed for anxiety. 60 tablet 3  . oxyCODONE (ROXICODONE) 15 MG immediate release tablet Take 1 tablet (15 mg total) by mouth every 6 (six) hours as needed for pain. RX CAN BE FILLED 10-19-14 120 tablet 0  . ondansetron (ZOFRAN ODT) 4 MG disintegrating tablet Take 1 tablet (4 mg total) by mouth every 8 (eight) hours as needed for nausea. (Patient not taking: Reported on 11/18/2014) 40 tablet 3  . sulfamethoxazole-trimethoprim (BACTRIM DS) 800-160 MG per tablet Take 1 tablet by mouth 2 (two) times daily. (Patient not taking: Reported on 11/18/2014) 20 tablet 0  . [DISCONTINUED] venlafaxine (EFFEXOR XR) 37.5 MG 24 hr capsule Take 1 capsule (37.5 mg total) by mouth daily. 30 capsule 1   No current  facility-administered medications on file prior to visit.    Allergies  Allergen Reactions  . Cephalexin Diarrhea and Nausea And Vomiting    Review of Systems  Review of Systems  Constitutional: Negative for fever and malaise/fatigue.  HENT: Negative for congestion.   Eyes: Negative for discharge.  Respiratory: Negative for shortness of breath.   Cardiovascular: Negative for chest pain, palpitations and leg swelling.  Gastrointestinal: Negative for nausea, abdominal pain and diarrhea.  Genitourinary: Negative for dysuria.  Musculoskeletal: Positive for neck pain. Negative for falls.  Skin: Negative for rash.  Neurological: Negative for loss of consciousness and headaches.  Endo/Heme/Allergies: Negative for polydipsia.  Psychiatric/Behavioral: Negative for depression and suicidal ideas. The patient is not nervous/anxious and does not have insomnia.     Objective  BP 119/73 mmHg  Pulse 93  Temp(Src) 98.1 F (36.7 C) (Oral)  Resp 18  Ht 5\' 6"  (1.676 m)  Wt 136 lb (61.689 kg)  BMI 21.96 kg/m2  SpO2 100%  LMP 11/15/2014  Physical Exam  Physical Exam  Constitutional: She is oriented to person, place, and time and well-developed, well-nourished, and in no distress. No distress.  HENT:  Head: Normocephalic and atraumatic.  Eyes: Conjunctivae are normal.  Neck: Neck supple. No thyromegaly present.  Cardiovascular: Normal rate, regular rhythm and normal heart sounds.   No murmur heard. Pulmonary/Chest: Effort normal and breath sounds normal. She has no wheezes.  Abdominal: She exhibits no distension and no mass.  Musculoskeletal: She exhibits no edema.  Lymphadenopathy:    She has no cervical adenopathy.  Neurological: She is alert and oriented to person, place, and time.  Skin: Skin is warm and dry. No rash noted. She is not diaphoretic.  Psychiatric: Memory, affect and judgment normal.    Lab Results  Component Value Date   TSH 1.218 11/21/2011   Lab Results    Component Value Date   WBC 10.3 07/03/2013   HGB 10.2* 07/03/2013   HCT 29.5* 07/03/2013   MCV 92.5 07/03/2013   PLT 205 07/03/2013   Lab Results  Component Value Date   CREATININE 0.82 11/21/2011   BUN 17 11/21/2011   NA 140 11/21/2011   K 4.5 11/21/2011   CL 107 11/21/2011   CO2 23 11/21/2011   Lab Results  Component Value Date   ALT 11 11/21/2011   AST 13 11/21/2011   ALKPHOS 118* 11/21/2011   BILITOT 0.6 11/21/2011   Lab Results  Component Value Date   CHOL 140 09/13/2010   Lab Results  Component Value Date   HDL 46.10 09/13/2010  Lab Results  Component Value Date   LDLCALC 88 09/13/2010   Lab Results  Component Value Date   TRIG 29.0 09/13/2010   Lab Results  Component Value Date   CHOLHDL 3 09/13/2010     Assessment & Plan  Migraine No flares with being back in school or addition of adderall   Closed fracture of cervical vertebra Doing well on current pain meds despite carrying around her small children and being back in school on computer. Allowed refills today and encouraged to minimize pain med use when possible   TOBACCO ABUSE Encouraged complete cessation. Discussed need to quit as relates to risk of numerous cancers, cardiac and pulmonary disease as well as neurologic complications. Counseled for greater than 3 minutes. Is down to 1/4 ppd.    PANIC DISORDER Doing much better no recent panic attack is using Clonazepam very infrequently   ADD (attention deficit disorder) Good response to low dose adderall, no concerning side effects and able to concentrate and get her homework done for her 4 classes. Allowed refills today

## 2014-11-18 NOTE — Assessment & Plan Note (Signed)
Encouraged complete cessation. Discussed need to quit as relates to risk of numerous cancers, cardiac and pulmonary disease as well as neurologic complications. Counseled for greater than 3 minutes. Is down to 1/4 ppd.

## 2014-11-20 ENCOUNTER — Ambulatory Visit: Payer: Self-pay | Admitting: Family Medicine

## 2014-12-15 ENCOUNTER — Telehealth: Payer: Self-pay | Admitting: Family Medicine

## 2014-12-15 DIAGNOSIS — R52 Pain, unspecified: Secondary | ICD-10-CM

## 2014-12-15 NOTE — Telephone Encounter (Signed)
Not due till 2/26 can have at the end of the week.

## 2014-12-15 NOTE — Telephone Encounter (Signed)
Patient calling in requesting a new oxycodone rx.

## 2014-12-16 ENCOUNTER — Encounter: Payer: Self-pay | Admitting: Family Medicine

## 2014-12-16 NOTE — Telephone Encounter (Signed)
Left a message for call back.  

## 2014-12-18 ENCOUNTER — Telehealth: Payer: Self-pay | Admitting: Family Medicine

## 2014-12-19 MED ORDER — OXYCODONE HCL 15 MG PO TABS: 15.0000 mg | ORAL_TABLET | Freq: Four times a day (QID) | ORAL | Status: DC | PRN

## 2014-12-19 NOTE — Telephone Encounter (Signed)
Prescription is not in red folder and patient has not picked up.  Advise if ok to reprint.

## 2014-12-19 NOTE — Telephone Encounter (Signed)
Patient is requesting this. She ran out yesterday

## 2014-12-19 NOTE — Telephone Encounter (Signed)
Rx printed and placed in Dr. Charlett Blake red folder for review and signature.

## 2014-12-19 NOTE — Telephone Encounter (Signed)
[facility] in [place]

## 2014-12-19 NOTE — Telephone Encounter (Signed)
Called left message to call back to request which pharmacy to fax clonazepam

## 2014-12-19 NOTE — Telephone Encounter (Signed)
Prescription was found in red folder and patient informed to pickup at the front desk.

## 2014-12-19 NOTE — Telephone Encounter (Signed)
Faxed hardcopy for Clonazepam to [facility] [place].  Patient did inform by phone where to send.

## 2015-01-19 ENCOUNTER — Other Ambulatory Visit: Payer: Self-pay | Admitting: Family Medicine

## 2015-01-21 ENCOUNTER — Encounter: Payer: Self-pay | Admitting: Family Medicine

## 2015-01-21 ENCOUNTER — Telehealth: Payer: Self-pay | Admitting: Family Medicine

## 2015-01-21 NOTE — Telephone Encounter (Signed)
Caller name: [place] Relation to pt: self Call back:  [phone] Pharmacy:  Reason for call:   Patient is requesting a refill of oxycodone and adderall. Patient is out of both.

## 2015-01-21 NOTE — Telephone Encounter (Signed)
Please advise.  Last office visit 11/18/14 Oxycodone last refilled- 12/19/14 for 120 and 0 (q6h PRN) Adderall last refilled 11/18/14 for 60 and 0 (takes BID)  Controlled Substance Contract signed 07/24/14  No UDS

## 2015-01-21 NOTE — Telephone Encounter (Signed)
Thanks. If she has filled her Adderall 2 weeks apart then she has broken her contract and she will no longer be able to get controlled substances here. No Oxycodone or Adderall. Thanks for looking this up.

## 2015-01-21 NOTE — Telephone Encounter (Signed)
Patient not under my care.  She has filled her Adderall two weeks a part at two separate pharmacies that were both prescribed the same day.  This is a violation of her controlled substance contract and it will be up to Dr. Charlett Blake to continue prescribing this medications to her.  I have placed a copy of the [place] printout on Dr. Frederik Pear request.  I will not refill these medications so she may have to wait until her PCP returns.

## 2015-01-22 ENCOUNTER — Encounter: Payer: Self-pay | Admitting: Family Medicine

## 2015-01-22 ENCOUNTER — Telehealth: Payer: Self-pay | Admitting: Family Medicine

## 2015-01-22 DIAGNOSIS — R5383 Other fatigue: Secondary | ICD-10-CM

## 2015-01-22 DIAGNOSIS — R52 Pain, unspecified: Secondary | ICD-10-CM

## 2015-01-22 DIAGNOSIS — R41840 Attention and concentration deficit: Secondary | ICD-10-CM

## 2015-01-22 DIAGNOSIS — R5381 Other malaise: Secondary | ICD-10-CM

## 2015-01-22 MED ORDER — AMPHETAMINE-DEXTROAMPHETAMINE 10 MG PO TABS: 10.0000 mg | ORAL_TABLET | Freq: Two times a day (BID) | ORAL | Status: DC

## 2015-01-22 MED ORDER — OXYCODONE HCL 15 MG PO TABS: 15.0000 mg | ORAL_TABLET | Freq: Four times a day (QID) | ORAL | Status: DC | PRN

## 2015-01-22 NOTE — Telephone Encounter (Signed)
Did informed the patient PCP would no longer prescribed her controlled medications based on information from telephone note dated 01/21/15 and information obtained by Elyn Aquas.  Patient had no response other than "ok".  I did inform she could certainly followup with PCP at an OV.

## 2015-01-22 NOTE — Telephone Encounter (Signed)
After viewing telephone note from Elyn Aquas (01/21/15) and PCP response to his findings now understand the patient is not to get these refills.  Sorry, all these messages a little confusing.  If the patient returns my call I will certainly let her know that she will not get her refills as requested. Will forward this note to PCP

## 2015-01-22 NOTE — Telephone Encounter (Signed)
Printed Adderall and Oxycodone refills as instructed by PCP per mychart request from patient. Put on counter for PCP to sign when she returns to the office on Monday 01/26/15.  Have called the patient left message to call back

## 2015-01-22 NOTE — Telephone Encounter (Signed)
Thanks

## 2015-01-22 NOTE — Telephone Encounter (Signed)
Dr. Charlett Blake I printed Adderall and Oxycodone based on going through the mychart messages and your response. You instructed ok to refill 2 meds , do UDS and schedule appointment, to print prescriptions and you would sign on Monday 01/26/15.

## 2015-01-22 NOTE — Telephone Encounter (Signed)
Patient called in regarding this. Best # [phone]

## 2015-01-23 ENCOUNTER — Telehealth: Payer: Self-pay | Admitting: Family Medicine

## 2015-01-23 NOTE — Telephone Encounter (Signed)
Called the patient on cell and home number left a detailed message to call the office to schedule an appointment with PCP asap to discuss medications.

## 2015-01-23 NOTE — Telephone Encounter (Signed)
Called the patient left a message on home and cell number to call back to discuss mychart message from PCP.

## 2015-02-04 ENCOUNTER — Telehealth: Payer: Self-pay | Admitting: *Deleted

## 2015-02-04 NOTE — Telephone Encounter (Signed)
Paperwork received from [facility] - To let us know that our patient is pregnant and in treatment - she is presently using Methadone.

## 2015-03-05 ENCOUNTER — Other Ambulatory Visit: Payer: Medicaid Other

## 2015-03-10 ENCOUNTER — Ambulatory Visit: Payer: Self-pay | Admitting: Family Medicine

## 2015-03-19 ENCOUNTER — Inpatient Hospital Stay (HOSPITAL_COMMUNITY)
Admission: EM | Admit: 2015-03-19 | Discharge: 2015-03-19 | Disposition: A | Discharge status: Home / Self Care | Payer: Medicaid Other | Source: Ambulatory Visit | Attending: Obstetrics & Gynecology | Admitting: Obstetrics & Gynecology

## 2015-03-19 ENCOUNTER — Encounter (HOSPITAL_COMMUNITY): Payer: Self-pay | Admitting: *Deleted

## 2015-03-19 DIAGNOSIS — O99332 Smoking (tobacco) complicating pregnancy, second trimester: Secondary | ICD-10-CM | POA: Insufficient documentation

## 2015-03-19 DIAGNOSIS — Z981 Arthrodesis status: Secondary | ICD-10-CM | POA: Diagnosis not present

## 2015-03-19 DIAGNOSIS — Z8249 Family history of ischemic heart disease and other diseases of the circulatory system: Secondary | ICD-10-CM | POA: Diagnosis not present

## 2015-03-19 DIAGNOSIS — R109 Unspecified abdominal pain: Secondary | ICD-10-CM | POA: Diagnosis present

## 2015-03-19 DIAGNOSIS — F17210 Nicotine dependence, cigarettes, uncomplicated: Secondary | ICD-10-CM | POA: Diagnosis not present

## 2015-03-19 DIAGNOSIS — Z79899 Other long term (current) drug therapy: Secondary | ICD-10-CM | POA: Diagnosis not present

## 2015-03-19 DIAGNOSIS — Z3A23 23 weeks gestation of pregnancy: Secondary | ICD-10-CM | POA: Insufficient documentation

## 2015-03-19 DIAGNOSIS — O9989 Other specified diseases and conditions complicating pregnancy, childbirth and the puerperium: Secondary | ICD-10-CM | POA: Insufficient documentation

## 2015-03-19 LAB — URINALYSIS, ROUTINE W REFLEX MICROSCOPIC
Bilirubin Urine: NEGATIVE
Glucose, UA: NEGATIVE mg/dL
HGB URINE DIPSTICK: NEGATIVE
KETONES UR: 15 mg/dL — AB
LEUKOCYTES UA: NEGATIVE
Nitrite: NEGATIVE
Protein, ur: NEGATIVE mg/dL
Specific Gravity, Urine: >1.03 — ABNORMAL HIGH (ref 1.005–1.030)
Urobilinogen, UA: 0.2 mg/dL (ref 0.0–1.0)
pH: 6 (ref 5.0–8.0)

## 2015-03-19 MED ORDER — CYCLOBENZAPRINE HCL 5 MG PO TABS: 5.0000 mg | ORAL_TABLET | Freq: Three times a day (TID) | ORAL | Status: DC | PRN

## 2015-03-19 MED ORDER — PROMETHAZINE HCL 12.5 MG PO TABS: 12.5000 mg | ORAL_TABLET | Freq: Four times a day (QID) | ORAL | Status: DC | PRN
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
## 2015-03-19 NOTE — MAU Provider Note (Signed)
History     CSN: [id number]  Arrival date and time: 03/19/15 1130  Chief Complaint  Patient presents with  . Abdominal Cramping   HPI [name] [name] 34 y.o. [id number] @[redacted]w[redacted]d  presents to MAU complaining of cramping x 1 week.  It is intermittent with increased movement but can occur at any time of day.  It does awaken her at night.  She denies VB, LOF, nausea, vomiting, fever, weakness, vaginal discharge.  She has not yet had any prenatal care. States she is waiting on a provider change for her medicaid card. Says she filled out the paperwork a week ago. Has a clinic she wants to go to [facility]).   OB History    Gravida Para Term Preterm AB TAB SAB Ectopic Multiple Living   5 4 3 1      4     -Hx of preterm delivery when pat was ~34yo, unknown cause > other pregnancies after full term   Past Medical History  Diagnosis Date  . Mental disorder   . Headache(784.0)   . Preterm labor   . Fracture of spinal vertebra 2009    Surgery in 2011  . Hemorrhoids thrombosed   . Anxiety   . Postpartum depression 11/27/2011  . PHARYNGITIS 10/13/2010    Qualifier: Diagnosis of  By: Charlett Blake MD, Erline Levine    . Acute bronchitis 10/13/2010    Qualifier: Diagnosis of  By: Charlett Blake MD, Erline Levine    . Anxiety and depression 11/27/2011  . Nausea & vomiting 07/10/2012  . Arthritis of spine 08/16/2010    Qualifier: Diagnosis of  By: Charlett Blake MD, Erline Levine    . Insomnia 07/13/2013  . ADD (attention deficit disorder) 06/18/2014  . Seizures     Last seizure 2005    Past Surgical History  Procedure Laterality Date  . Cervical fusion  05/26/2011    She thinks it was C2  . Wisdom tooth extraction      Family History  Problem Relation Age of Onset  . Arthritis Mother   . Hypertension Mother   . Coronary artery disease Father     s/p MIs/ smoker  . Alcohol abuse Father   . Heart attack Father   . Endometriosis Sister   . Aplastic anemia Maternal Grandmother   . Arthritis Maternal Grandmother     s/p hip  replacement for arthritis  . Anemia Maternal Grandmother     aplastic  . COPD Maternal Grandmother   . Heart disease Maternal Grandmother   . Scoliosis Maternal Grandfather     s/p surgeries  . Endometriosis Sister   . Cancer Sister     cervical  . Hypertension Sister   . Otitis media Daughter     History  Substance Use Topics  . Smoking status: Current Every Day Smoker -- 0.50 packs/day for 17 years    Types: Cigarettes  . Smokeless tobacco: Never Used  . Alcohol Use: No    Allergies:  Allergies  Allergen Reactions  . Cephalexin Diarrhea and Nausea And Vomiting    Prescriptions prior to admission  Medication Sig Dispense Refill Last Dose  . albuterol (PROVENTIL HFA;VENTOLIN HFA) 108 (90 BASE) MCG/ACT inhaler Inhale 2 puffs into the lungs every 6 (six) hours as needed for wheezing or shortness of breath. 1 Inhaler 2 Taking  . amphetamine-dextroamphetamine (ADDERALL) 10 MG tablet Take 1 tablet (10 mg total) by mouth 2 (two) times daily. April 2016 60 tablet 0   . clonazePAM (KLONOPIN) 0.5 MG tablet TAKE  1 TABLET BY MOUTH TWICE A DAY AS NEEDED 60 tablet 2   . ondansetron (ZOFRAN ODT) 4 MG disintegrating tablet Take 1 tablet (4 mg total) by mouth every 8 (eight) hours as needed for nausea. (Patient not taking: Reported on 11/18/2014) 40 tablet 3 Not Taking  . oxyCODONE (ROXICODONE) 15 MG immediate release tablet Take 1 tablet (15 mg total) by mouth every 6 (six) hours as needed for pain. 120 tablet 0   . sulfamethoxazole-trimethoprim (BACTRIM DS) 800-160 MG per tablet Take 1 tablet by mouth 2 (two) times daily. (Patient not taking: Reported on 11/18/2014) 20 tablet 0 Not Taking    ROS Pertinent ROS in HPI.  All other systems are negative.   Physical Exam   Blood pressure 116/58, pulse 90, temperature 98.6 F (37 C), temperature source Oral, resp. rate 18, height 5\' 6"  (1.676 m), weight 141 lb (63.957 kg), last menstrual period 10/07/2014, not currently breastfeeding.  Physical  Exam  Constitutional: She is oriented to person, place, and time. She appears well-developed and well-nourished. No distress.  HENT:  Head: Normocephalic and atraumatic.  Eyes: EOM are normal.  Cardiovascular: Normal rate, regular rhythm, normal heart sounds and intact distal pulses.   Respiratory: Effort normal and breath sounds normal.  GI: Soft. Bowel sounds are normal. There is no tenderness.  Musculoskeletal: Normal range of motion. She exhibits no edema.  Neurological: She is alert and oriented to person, place, and time.  Skin: Skin is warm and dry.   MAU Course  Procedures - None  MDM: - NST with FHT; reassuring and confirming pregnancy -Toco - no contractions seen -UA with signs of dehydration  Assessment and Plan  Patient is 34 y.o. [id number] [redacted]w[redacted]d reporting abdominal cramping likely secondary to Braxton-Hicks contractions.   - Encouraged PO intake to stay hydrated - Rx for Flexeril and Phenergan - Advised patient that she needs to get in with a clinic soon to establish care before she is to far along - Letter given to patient confirming pregnancy - preterm labor precautions  Luiz Blare, DO 03/19/2015, 2:05 PM PGY-1, [place]

## 2015-04-14 ENCOUNTER — Encounter: Payer: Self-pay | Admitting: Obstetrics and Gynecology

## 2015-04-14 ENCOUNTER — Other Ambulatory Visit (HOSPITAL_COMMUNITY)
Admission: RE | Admit: 2015-04-14 | Discharge: 2015-04-14 | Disposition: A | Discharge status: Home / Self Care | Payer: Medicaid Other | Source: Ambulatory Visit | Attending: Obstetrics and Gynecology | Admitting: Obstetrics and Gynecology

## 2015-04-14 ENCOUNTER — Ambulatory Visit (INDEPENDENT_AMBULATORY_CARE_PROVIDER_SITE_OTHER): Payer: Medicaid Other | Admitting: Obstetrics and Gynecology

## 2015-04-14 VITALS — BP 113/56 | HR 81 | Temp 98.3°F | Wt 146.8 lb

## 2015-04-14 DIAGNOSIS — O09892 Supervision of other high risk pregnancies, second trimester: Secondary | ICD-10-CM | POA: Insufficient documentation

## 2015-04-14 DIAGNOSIS — F418 Other specified anxiety disorders: Secondary | ICD-10-CM

## 2015-04-14 DIAGNOSIS — Z113 Encounter for screening for infections with a predominantly sexual mode of transmission: Secondary | ICD-10-CM | POA: Insufficient documentation

## 2015-04-14 DIAGNOSIS — R110 Nausea: Secondary | ICD-10-CM

## 2015-04-14 DIAGNOSIS — K219 Gastro-esophageal reflux disease without esophagitis: Secondary | ICD-10-CM

## 2015-04-14 DIAGNOSIS — Z01419 Encounter for gynecological examination (general) (routine) without abnormal findings: Secondary | ICD-10-CM | POA: Diagnosis present

## 2015-04-14 DIAGNOSIS — Z23 Encounter for immunization: Secondary | ICD-10-CM | POA: Diagnosis not present

## 2015-04-14 DIAGNOSIS — F17210 Nicotine dependence, cigarettes, uncomplicated: Secondary | ICD-10-CM

## 2015-04-14 DIAGNOSIS — O0932 Supervision of pregnancy with insufficient antenatal care, second trimester: Secondary | ICD-10-CM

## 2015-04-14 DIAGNOSIS — O210 Mild hyperemesis gravidarum: Secondary | ICD-10-CM

## 2015-04-14 DIAGNOSIS — O26899 Other specified pregnancy related conditions, unspecified trimester: Secondary | ICD-10-CM | POA: Insufficient documentation

## 2015-04-14 DIAGNOSIS — F1120 Opioid dependence, uncomplicated: Secondary | ICD-10-CM

## 2015-04-14 DIAGNOSIS — O99320 Drug use complicating pregnancy, unspecified trimester: Secondary | ICD-10-CM

## 2015-04-14 DIAGNOSIS — F329 Major depressive disorder, single episode, unspecified: Secondary | ICD-10-CM

## 2015-04-14 DIAGNOSIS — O99322 Drug use complicating pregnancy, second trimester: Secondary | ICD-10-CM

## 2015-04-14 DIAGNOSIS — F419 Anxiety disorder, unspecified: Secondary | ICD-10-CM

## 2015-04-14 DIAGNOSIS — Z1151 Encounter for screening for human papillomavirus (HPV): Secondary | ICD-10-CM | POA: Insufficient documentation

## 2015-04-14 DIAGNOSIS — O09212 Supervision of pregnancy with history of pre-term labor, second trimester: Secondary | ICD-10-CM

## 2015-04-14 DIAGNOSIS — M549 Dorsalgia, unspecified: Secondary | ICD-10-CM

## 2015-04-14 DIAGNOSIS — O0933 Supervision of pregnancy with insufficient antenatal care, third trimester: Secondary | ICD-10-CM | POA: Insufficient documentation

## 2015-04-14 DIAGNOSIS — O99332 Smoking (tobacco) complicating pregnancy, second trimester: Secondary | ICD-10-CM

## 2015-04-14 LAB — POCT URINALYSIS DIP (DEVICE)
Bilirubin Urine: NEGATIVE
Glucose, UA: NEGATIVE mg/dL
Hgb urine dipstick: NEGATIVE
Ketones, ur: NEGATIVE mg/dL
Leukocytes, UA: NEGATIVE
Nitrite: NEGATIVE
Protein, ur: NEGATIVE mg/dL
Specific Gravity, Urine: 1.02 (ref 1.005–1.030)
Urobilinogen, UA: 0.2 mg/dL (ref 0.0–1.0)
pH: 6 (ref 5.0–8.0)

## 2015-04-14 MED ORDER — TETANUS-DIPHTH-ACELL PERTUSSIS 5-2.5-18.5 LF-MCG/0.5 IM SUSP
0.5000 mL | Freq: Once | INTRAMUSCULAR | Status: AC
  Administered 2015-04-14: 0.5 mL via INTRAMUSCULAR

## 2015-04-14 NOTE — Patient Instructions (Signed)
Smoking Cessation Quitting smoking is important to your health and has many advantages. However, it is not always easy to quit since nicotine is a very addictive drug. Oftentimes, people try 3 times or more before being able to quit. This document explains the best ways for you to prepare to quit smoking. Quitting takes hard work and a lot of effort, but you can do it. ADVANTAGES OF QUITTING SMOKING  You will live longer, feel better, and live better.  Your body will feel the impact of quitting smoking almost immediately.  Within 20 minutes, blood pressure decreases. Your pulse returns to its normal level.  After 8 hours, carbon monoxide levels in the blood return to normal. Your oxygen level increases.  After 24 hours, the chance of having a heart attack starts to decrease. Your breath, hair, and body stop smelling like smoke.  After 48 hours, damaged nerve endings begin to recover. Your sense of taste and smell improve.  After 72 hours, the body is virtually free of nicotine. Your bronchial tubes relax and breathing becomes easier.  After 2 to 12 weeks, lungs can hold more air. Exercise becomes easier and circulation improves.  The risk of having a heart attack, stroke, cancer, or lung disease is greatly reduced.  After 1 year, the risk of coronary heart disease is cut in half.  After 5 years, the risk of stroke falls to the same as a nonsmoker.  After 10 years, the risk of lung cancer is cut in half and the risk of other cancers decreases significantly.  After 15 years, the risk of coronary heart disease drops, usually to the level of a nonsmoker.  If you are pregnant, quitting smoking will improve your chances of having a healthy baby.  The people you live with, especially any children, will be healthier.  You will have extra money to spend on things other than cigarettes. QUESTIONS TO THINK ABOUT BEFORE ATTEMPTING TO QUIT You may want to talk about your answers with your  health care provider.  Why do you want to quit?  If you tried to quit in the past, what helped and what did not?  What will be the most difficult situations for you after you quit? How will you plan to handle them?  Who can help you through the tough times? Your family? Friends? A health care provider?  What pleasures do you get from smoking? What ways can you still get pleasure if you quit? Here are some questions to ask your health care provider:  How can you help me to be successful at quitting?  What medicine do you think would be best for me and how should I take it?  What should I do if I need more help?  What is smoking withdrawal like? How can I get information on withdrawal? GET READY  Set a quit date.  Change your environment by getting rid of all cigarettes, ashtrays, matches, and lighters in your home, car, or work. Do not let people smoke in your home.  Review your past attempts to quit. Think about what worked and what did not. GET SUPPORT AND ENCOURAGEMENT You have a better chance of being successful if you have help. You can get support in many ways.  Tell your family, friends, and coworkers that you are going to quit and need their support. Ask them not to smoke around you.  Get individual, group, or telephone counseling and support. Programs are available at local hospitals and health centers. Call   your local health department for information about programs in your area.  Spiritual beliefs and practices may help some smokers quit.  Download a "quit meter" on your computer to keep track of quit statistics, such as how long you have gone without smoking, cigarettes not smoked, and money saved.  Get a self-help book about quitting smoking and staying off tobacco. [place] yourself from urges to smoke. Talk to someone, go for a walk, or occupy your time with a task.  Change your normal routine. Take a different route to work.  Drink tea instead of coffee. Eat breakfast in a different place.  Reduce your stress. Take a hot bath, exercise, or read a book.  Plan something enjoyable to do every day. Reward yourself for not smoking.  Explore interactive web-based programs that specialize in helping you quit. GET MEDICINE AND USE IT CORRECTLY Medicines can help you stop smoking and decrease the urge to smoke. Combining medicine with the above behavioral methods and support can greatly increase your chances of successfully quitting smoking.  Nicotine replacement therapy helps deliver nicotine to your body without the negative effects and risks of smoking. Nicotine replacement therapy includes nicotine gum, lozenges, inhalers, nasal sprays, and skin patches. Some may be available over-the-counter and others require a prescription.  Antidepressant medicine helps people abstain from smoking, but how this works is unknown. This medicine is available by prescription.  Nicotinic receptor partial agonist medicine simulates the effect of nicotine in your brain. This medicine is available by prescription. Ask your health care provider for advice about which medicines to use and how to use them based on your health history. Your health care provider will tell you what side effects to look out for if you choose to be on a medicine or therapy. Carefully read the information on the package. Do not use any other product containing nicotine while using a nicotine replacement product.  RELAPSE OR DIFFICULT SITUATIONS Most relapses occur within the first 3 months after quitting. Do not be discouraged if you start smoking again. Remember, most people try several times before finally quitting. You may have symptoms of withdrawal because your body is used to nicotine. You may crave cigarettes, be irritable, feel very hungry, cough often, get headaches, or have difficulty concentrating. The withdrawal symptoms are only temporary. They are strongest  when you first quit, but they will go away within 10-14 days. To reduce the chances of relapse, try to:  Avoid drinking alcohol. Drinking lowers your chances of successfully quitting.  Reduce the amount of caffeine you consume. Once you quit smoking, the amount of caffeine in your body increases and can give you symptoms, such as a rapid heartbeat, sweating, and anxiety.  Avoid smokers because they can make you want to smoke.  Do not let weight gain distract you. Many smokers will gain weight when they quit, usually less than 10 pounds. Eat a healthy diet and stay active. You can always lose the weight gained after you quit.  Find ways to improve your mood other than smoking. FOR MORE INFORMATION  www.smokefree.gov  Document Released: 10/04/2001 Document Revised: 02/24/2014 Document Reviewed: 01/19/2012 [facility] Patient Information 2015 [place], [place]. This information is not intended to replace advice given to you by your health care provider. Make sure you discuss any questions you have with your health care provider. Second Trimester of Pregnancy The second trimester is from week 13 through week 28, month 4 through 6. This is often the  time in pregnancy that you feel your best. Often times, morning sickness has lessened or quit. You may have more energy, and you may get hungry more often. Your unborn baby (fetus) is growing rapidly. At the end of the sixth month, he or she is about 9 inches long and weighs about 1 pounds. You will likely feel the baby move (quickening) between 18 and 20 weeks of pregnancy. HOME CARE   Avoid all smoking, herbs, and alcohol. Avoid drugs not approved by your doctor.  Only take medicine as told by your doctor. Some medicines are safe and some are not during pregnancy.  Exercise only as told by your doctor. Stop exercising if you start having cramps.  Eat regular, healthy meals.  Wear a good support bra if your breasts are tender.  Do not use hot tubs,  steam rooms, or saunas.  Wear your seat belt when driving.  Avoid raw meat, uncooked cheese, and liter boxes and soil used by cats.  Take your prenatal vitamins.  Try taking medicine that helps you poop (stool softener) as needed, and if your doctor approves. Eat more fiber by eating fresh fruit, vegetables, and whole grains. Drink enough fluids to keep your pee (urine) clear or pale yellow.  Take warm water baths (sitz baths) to soothe pain or discomfort caused by hemorrhoids. Use hemorrhoid cream if your doctor approves.  If you have puffy, bulging veins (varicose veins), wear support hose. Raise (elevate) your feet for 15 minutes, 3-4 times a day. Limit salt in your diet.  Avoid heavy lifting, wear low heals, and sit up straight.  Rest with your legs raised if you have leg cramps or low back pain.  Visit your dentist if you have not gone during your pregnancy. Use a soft toothbrush to brush your teeth. Be gentle when you floss.  You can have sex (intercourse) unless your doctor tells you not to.  Go to your doctor visits. GET HELP IF:   You feel dizzy.  You have mild cramps or pressure in your lower belly (abdomen).  You have a nagging pain in your belly area.  You continue to feel sick to your stomach (nauseous), throw up (vomit), or have watery poop (diarrhea).  You have bad smelling fluid coming from your vagina.  You have pain with peeing (urination). GET HELP RIGHT AWAY IF:   You have a fever.  You are leaking fluid from your vagina.  You have spotting or bleeding from your vagina.  You have severe belly cramping or pain.  You lose or gain weight rapidly.  You have trouble catching your breath and have chest pain.  You notice sudden or extreme puffiness (swelling) of your face, hands, ankles, feet, or legs.  You have not felt the baby move in over an hour.  You have severe headaches that do not go away with medicine.  You have vision changes. Document  Released: 01/04/2010 Document Revised: 02/04/2013 Document Reviewed: 12/11/2012 [facility] Patient Information 2015 [place], [place]. This information is not intended to replace advice given to you by your health care provider. Make sure you discuss any questions you have with your health care provider.

## 2015-04-14 NOTE — Progress Notes (Signed)
Initial OB appt New OB/28 wk educational material given Breastfeeding tip of the week reviewed TDap vaccine given Schedule Ultrasound New OB labs Home Medicaid form complete Tubal consent signed

## 2015-04-15 LAB — PRENATAL PROFILE (SOLSTAS)
Antibody Screen: NEGATIVE
BASOS ABS: 0 10*3/uL (ref 0.0–0.1)
Basophils Relative: 0 % (ref 0–1)
Eosinophils Absolute: 0.4 10*3/uL (ref 0.0–0.7)
Eosinophils Relative: 3 % (ref 0–5)
HEMATOCRIT: 31.5 % — AB (ref 36.0–46.0)
HEMOGLOBIN: 10.5 g/dL — AB (ref 12.0–15.0)
HEP B S AG: NEGATIVE
HIV: NONREACTIVE
LYMPHS PCT: 20 % (ref 12–46)
Lymphs Abs: 2.6 10*3/uL (ref 0.7–4.0)
MCH: 30.5 pg (ref 26.0–34.0)
MCHC: 33.3 g/dL (ref 30.0–36.0)
MCV: 91.6 fL (ref 78.0–100.0)
MONOS PCT: 9 % (ref 3–12)
MPV: 9.5 fL (ref 8.6–12.4)
Monocytes Absolute: 1.2 10*3/uL — ABNORMAL HIGH (ref 0.1–1.0)
NEUTROS PCT: 68 % (ref 43–77)
Neutro Abs: 9 10*3/uL — ABNORMAL HIGH (ref 1.7–7.7)
Platelets: 325 10*3/uL (ref 150–400)
RBC: 3.44 MIL/uL — ABNORMAL LOW (ref 3.87–5.11)
RDW: 13 % (ref 11.5–15.5)
RH TYPE: POSITIVE
Rubella: 3.69 Index — ABNORMAL HIGH (ref <0.90)
WBC: 13.2 10*3/uL — ABNORMAL HIGH (ref 4.0–10.5)

## 2015-04-15 LAB — CULTURE, OB URINE
COLONY COUNT: NO GROWTH
Organism ID, Bacteria: NO GROWTH

## 2015-04-15 LAB — GLUCOSE TOLERANCE, 1 HOUR (50G) W/O FASTING: Glucose, 1 Hour GTT: 102 mg/dL (ref 70–140)

## 2015-04-16 LAB — CYTOLOGY - PAP

## 2015-04-17 ENCOUNTER — Encounter (HOSPITAL_COMMUNITY): Payer: Self-pay

## 2015-04-17 ENCOUNTER — Other Ambulatory Visit: Payer: Self-pay | Admitting: Obstetrics and Gynecology

## 2015-04-17 ENCOUNTER — Ambulatory Visit (HOSPITAL_COMMUNITY)
Admission: RE | Admit: 2015-04-17 | Discharge: 2015-04-17 | Disposition: A | Discharge status: Home / Self Care | Payer: Medicaid Other | Source: Ambulatory Visit | Attending: Obstetrics and Gynecology | Admitting: Obstetrics and Gynecology

## 2015-04-17 DIAGNOSIS — F1120 Opioid dependence, uncomplicated: Secondary | ICD-10-CM

## 2015-04-17 DIAGNOSIS — K219 Gastro-esophageal reflux disease without esophagitis: Secondary | ICD-10-CM

## 2015-04-17 DIAGNOSIS — O09892 Supervision of other high risk pregnancies, second trimester: Secondary | ICD-10-CM

## 2015-04-17 DIAGNOSIS — O99332 Smoking (tobacco) complicating pregnancy, second trimester: Secondary | ICD-10-CM

## 2015-04-17 DIAGNOSIS — O09212 Supervision of pregnancy with history of pre-term labor, second trimester: Secondary | ICD-10-CM

## 2015-04-17 DIAGNOSIS — O99322 Drug use complicating pregnancy, second trimester: Secondary | ICD-10-CM

## 2015-04-17 DIAGNOSIS — O0932 Supervision of pregnancy with insufficient antenatal care, second trimester: Secondary | ICD-10-CM

## 2015-04-17 DIAGNOSIS — R110 Nausea: Secondary | ICD-10-CM

## 2015-04-17 DIAGNOSIS — F329 Major depressive disorder, single episode, unspecified: Secondary | ICD-10-CM

## 2015-04-17 DIAGNOSIS — F32A Depression, unspecified: Secondary | ICD-10-CM

## 2015-04-17 DIAGNOSIS — M549 Dorsalgia, unspecified: Secondary | ICD-10-CM

## 2015-04-17 DIAGNOSIS — O0933 Supervision of pregnancy with insufficient antenatal care, third trimester: Secondary | ICD-10-CM | POA: Diagnosis not present

## 2015-04-17 DIAGNOSIS — Z36 Encounter for antenatal screening of mother: Secondary | ICD-10-CM | POA: Insufficient documentation

## 2015-04-17 DIAGNOSIS — Z23 Encounter for immunization: Secondary | ICD-10-CM

## 2015-04-17 DIAGNOSIS — Z3A31 31 weeks gestation of pregnancy: Secondary | ICD-10-CM | POA: Diagnosis not present

## 2015-04-17 DIAGNOSIS — O99323 Drug use complicating pregnancy, third trimester: Secondary | ICD-10-CM | POA: Insufficient documentation

## 2015-04-17 DIAGNOSIS — O09213 Supervision of pregnancy with history of pre-term labor, third trimester: Secondary | ICD-10-CM | POA: Diagnosis not present

## 2015-04-17 DIAGNOSIS — F419 Anxiety disorder, unspecified: Secondary | ICD-10-CM

## 2015-04-17 DIAGNOSIS — O26899 Other specified pregnancy related conditions, unspecified trimester: Secondary | ICD-10-CM

## 2015-04-17 DIAGNOSIS — Z3689 Encounter for other specified antenatal screening: Secondary | ICD-10-CM | POA: Insufficient documentation

## 2015-04-20 LAB — OPIATES/OPIOIDS (LC/MS-MS)
Codeine Urine: NEGATIVE ng/mL (ref <50)
Hydrocodone: NEGATIVE ng/mL (ref <50)
Hydromorphone: 355 ng/mL — AB (ref <50)
MORPHINE: 26894 ng/mL — AB (ref <50)
NORHYDROCODONE, UR: NEGATIVE ng/mL (ref <50)
NOROXYCODONE, UR: NEGATIVE ng/mL (ref <50)
OXYCODONE, UR: NEGATIVE ng/mL (ref <50)
OXYMORPHONE, URINE: NEGATIVE ng/mL (ref <50)

## 2015-04-20 LAB — METHADONE (GC/LC/MS), URINE
EDDP (GC/LC/MS), ur confirm: 10522 ng/mL — AB (ref <100)
Methadone (GC/LC/MS), ur confirm: 2301 ng/mL — AB (ref <100)

## 2015-04-20 LAB — AMPHETAMINES (GC/LC/MS), URINE
AMPHETAMINE CONF, UR: 1105 ng/mL — AB (ref <250)
Methamphetamine Quant, Ur: NEGATIVE ng/mL (ref <250)

## 2015-04-20 LAB — COCAINE METABOLITE (GC/LC/MS), URINE: Benzoylecgonine GC/MS Conf: 4100 ng/mL — AB (ref <100)

## 2015-04-21 LAB — PRESCRIPTION MONITORING PROFILE (19 PANEL)
BARBITURATE SCREEN, URINE: NEGATIVE ng/mL
BUPRENORPHINE, URINE: NEGATIVE ng/mL
Benzodiazepine Screen, Urine: NEGATIVE ng/mL
CANNABINOID SCRN UR: NEGATIVE ng/mL
Carisoprodol, Urine: NEGATIVE ng/mL
Creatinine, Urine: 96.8 mg/dL (ref >20.0)
Fentanyl, Ur: NEGATIVE ng/mL
MDMA URINE: NEGATIVE ng/mL
METHAQUALONE SCREEN (URINE): NEGATIVE ng/mL
Meperidine, Ur: NEGATIVE ng/mL
Nitrites, Initial: NEGATIVE ug/mL
Oxycodone Screen, Ur: NEGATIVE ng/mL
PHENCYCLIDINE, UR: NEGATIVE ng/mL
PROPOXYPHENE: NEGATIVE ng/mL
TAPENTADOLUR: NEGATIVE ng/mL
Tramadol Scrn, Ur: NEGATIVE ng/mL
Zolpidem, Urine: NEGATIVE ng/mL
pH, Initial: 6.5 pH (ref 4.5–8.9)

## 2015-04-22 ENCOUNTER — Other Ambulatory Visit: Payer: Self-pay | Admitting: Certified Nurse Midwife

## 2015-04-23 ENCOUNTER — Ambulatory Visit (INDEPENDENT_AMBULATORY_CARE_PROVIDER_SITE_OTHER): Payer: Medicaid Other | Admitting: Obstetrics & Gynecology

## 2015-04-23 ENCOUNTER — Encounter: Payer: Self-pay | Admitting: Advanced Practice Midwife

## 2015-04-23 VITALS — BP 105/52 | HR 75 | Temp 98.7°F | Wt 147.9 lb

## 2015-04-23 DIAGNOSIS — O99323 Drug use complicating pregnancy, third trimester: Secondary | ICD-10-CM

## 2015-04-23 DIAGNOSIS — F1920 Other psychoactive substance dependence, uncomplicated: Secondary | ICD-10-CM | POA: Diagnosis not present

## 2015-04-23 DIAGNOSIS — O0933 Supervision of pregnancy with insufficient antenatal care, third trimester: Secondary | ICD-10-CM

## 2015-04-23 LAB — POCT URINALYSIS DIP (DEVICE)
BILIRUBIN URINE: NEGATIVE
GLUCOSE, UA: NEGATIVE mg/dL
Hgb urine dipstick: NEGATIVE
KETONES UR: NEGATIVE mg/dL
LEUKOCYTES UA: NEGATIVE
NITRITE: NEGATIVE
PROTEIN: NEGATIVE mg/dL
Specific Gravity, Urine: >=1.03 (ref 1.005–1.030)
Urobilinogen, UA: 0.2 mg/dL (ref 0.0–1.0)
pH: 6 (ref 5.0–8.0)

## 2015-04-23 MED ORDER — PANTOPRAZOLE SODIUM 40 MG PO TBEC: 40.0000 mg | DELAYED_RELEASE_TABLET | Freq: Every day | ORAL | Status: DC

## 2015-04-23 MED ORDER — CITRANATAL ASSURE 35-1 & 300 MG PO MISC: 1.0000 | Freq: Every day | ORAL | Status: DC

## 2015-04-23 NOTE — Progress Notes (Signed)
[place] was done 6/24result reviewed  Subjective:heartburn and nausea  [name] [name] is a 34 y.o. [id number] at [redacted]w[redacted]d being seen today for ongoing prenatal care.  Patient reports no complaints.  Contractions: Irregular.  Vag. Bleeding: None. Movement: Present. Denies leaking of fluid.   The following portions of the patient's history were reviewed and updated as appropriate: allergies, current medications, past family history, past medical history, past social history, past surgical history and problem list.   Objective:   Filed Vitals:   04/23/15 1030  BP: 105/52  Pulse: 75  Temp: 98.7 F (37.1 C)  Weight: 147 lb 14.4 oz (67.087 kg)    Fetal Status: Fetal Heart Rate (bpm): 115   Movement: Present     General:  Alert, oriented and cooperative. Patient is in no acute distress.  Skin: Skin is warm and dry. No rash noted.   Cardiovascular: Normal heart rate noted  Respiratory: Normal respiratory effort, no problems with respiration noted  Abdomen: Soft, gravid, appropriate for gestational age. Pain/Pressure: Absent     Vaginal: Vag. Bleeding: None.       Cervix: Not evaluated        Extremities: Normal range of motion.  Edema: None  Mental Status: Normal mood and affect. Normal behavior. Normal judgment and thought content.   Urinalysis: Urine Protein: Negative Urine Glucose: Negative  Assessment and Plan:  Pregnancy: [id number] at [redacted]w[redacted]d  There are no diagnoses linked to this encounter.  Preterm labor symptoms and general obstetric precautions including but not limited to vaginal bleeding, contractions, leaking of fluid and fetal movement were reviewed in detail with the patient.  Please refer to After Visit Summary for other counseling recommendations.  protonix for reflux, rx Citranatal assure 2 week f/u, f/u US 3 weeks for anatomy  Woodroe Mode, MD 04/23/2015

## 2015-04-28 ENCOUNTER — Telehealth: Payer: Self-pay | Admitting: *Deleted

## 2015-04-28 ENCOUNTER — Encounter: Payer: Self-pay | Admitting: *Deleted

## 2015-04-28 NOTE — Telephone Encounter (Signed)
Pt contacted the clinic stating she has an ulcer and may need to be seen earlier than appointment on 7/14.  Attempted to contact patient, no answer, left message for patient to contact the clinic.

## 2015-05-01 NOTE — Telephone Encounter (Signed)
Called patient and she states she thinks she may have an ulcer. States she has that burning sensation ever since she drank hot coffee. States the protonix isn't helping. Recommended tums, rolaids, or gaviscon until she comes in to her appt and can see a doctor. Recommended avoiding spicy or highly acidic foods. Patient verbalized understanding to all and had no questions

## 2015-05-07 ENCOUNTER — Ambulatory Visit (INDEPENDENT_AMBULATORY_CARE_PROVIDER_SITE_OTHER): Payer: Medicaid Other | Admitting: Family Medicine

## 2015-05-07 VITALS — BP 119/62 | HR 107 | Temp 98.2°F | Wt 154.2 lb

## 2015-05-07 DIAGNOSIS — O09213 Supervision of pregnancy with history of pre-term labor, third trimester: Secondary | ICD-10-CM | POA: Diagnosis not present

## 2015-05-07 DIAGNOSIS — F1120 Opioid dependence, uncomplicated: Secondary | ICD-10-CM

## 2015-05-07 DIAGNOSIS — O99323 Drug use complicating pregnancy, third trimester: Secondary | ICD-10-CM | POA: Diagnosis not present

## 2015-05-07 DIAGNOSIS — K219 Gastro-esophageal reflux disease without esophagitis: Secondary | ICD-10-CM

## 2015-05-07 DIAGNOSIS — O09212 Supervision of pregnancy with history of pre-term labor, second trimester: Secondary | ICD-10-CM

## 2015-05-07 DIAGNOSIS — O0933 Supervision of pregnancy with insufficient antenatal care, third trimester: Secondary | ICD-10-CM | POA: Diagnosis present

## 2015-05-07 DIAGNOSIS — O09892 Supervision of other high risk pregnancies, second trimester: Secondary | ICD-10-CM

## 2015-05-07 DIAGNOSIS — O99320 Drug use complicating pregnancy, unspecified trimester: Secondary | ICD-10-CM

## 2015-05-07 LAB — POCT URINALYSIS DIP (DEVICE)
Bilirubin Urine: NEGATIVE
GLUCOSE, UA: NEGATIVE mg/dL
Hgb urine dipstick: NEGATIVE
Ketones, ur: NEGATIVE mg/dL
LEUKOCYTES UA: NEGATIVE
Nitrite: NEGATIVE
Protein, ur: NEGATIVE mg/dL
SPECIFIC GRAVITY, URINE: 1.02 (ref 1.005–1.030)
Urobilinogen, UA: 0.2 mg/dL (ref 0.0–1.0)
pH: 7 (ref 5.0–8.0)

## 2015-05-07 MED ORDER — SUCRALFATE 1 GM/10ML PO SUSP: 1.0000 g | Freq: Three times a day (TID) | ORAL | Status: DC

## 2015-05-07 NOTE — Progress Notes (Signed)
Since last visit, she feels like she has an ulcer. Reports extreme right sided pain, worsens with standing or eating- pain only improves if she lays down. Tried tums, milk, zantac- but no relief; reports nausea as well

## 2015-05-07 NOTE — Progress Notes (Signed)
Subjective:  [name] [name] is a 34 y.o. [id number] at [redacted]w[redacted]d being seen today for ongoing prenatal care.  Patient reports no complaints.  Contractions: Irregular.  Vag. Bleeding: None. Movement: Present. Denies leaking of fluid.   The following portions of the patient's history were reviewed and updated as appropriate: allergies, current medications, past family history, past medical history, past social history, past surgical history and problem list.   Objective:   Filed Vitals:   05/07/15 0934  BP: 119/62  Pulse: 107  Temp: 98.2 F (36.8 C)  Weight: 154 lb 3.2 oz (69.945 kg)    Fetal Status: Fetal Heart Rate (bpm): 125 Fundal Height: 34 cm Movement: Present     General:  Alert, oriented and cooperative. Patient is in no acute distress.  Skin: Skin is warm and dry. No rash noted.   Cardiovascular: Normal heart rate noted  Respiratory: Normal respiratory effort, no problems with respiration noted  Abdomen: Soft, gravid, appropriate for gestational age. Pain/Pressure: Present     Vaginal: Vag. Bleeding: None.       Extremities: Normal range of motion.  Edema: None  Mental Status: Normal mood and affect. Normal behavior. Normal judgment and thought content.   Urinalysis: Urine Protein: Negative Urine Glucose: Negative  Assessment and Plan:  Pregnancy: [id number] at [redacted]w[redacted]d  1. Supervision of high-risk pregnancy with insufficient prenatal care in third trimester  - Prescription Monitoring Profile (17)-[facility]   2. History of preterm delivery, currently pregnant in second trimester PTL precautions  3. Methadone maintenance treatment affecting pregnancy, antepartum Met with SW today - US OB Follow Up; Future  4. GERD without esophagitis - sucralfate (CARAFATE) 1 GM/10ML suspension; Take 10 mLs (1 g total) by mouth 4 (four) times daily -  with meals and at bedtime.  Dispense: 420 mL; Refill: 0  Preterm labor symptoms and general obstetric precautions including but not limited to  vaginal bleeding, contractions, leaking of fluid and fetal movement were reviewed in detail with the patient. Please refer to After Visit Summary for other counseling recommendations.  Return in 2 weeks (on 05/21/2015).   Donnamae Jude, MD

## 2015-05-07 NOTE — Patient Instructions (Signed)
Third Trimester of Pregnancy The third trimester is from week 29 through week 42, months 7 through 9. The third trimester is a time when the fetus is growing rapidly. At the end of the ninth month, the fetus is about 20 inches in length and weighs 6-10 pounds.  BODY CHANGES Your body goes through many changes during pregnancy. The changes vary from woman to woman.   Your weight will continue to increase. You can expect to gain 25-35 pounds (11-16 kg) by the end of the pregnancy.  You may begin to get stretch marks on your hips, abdomen, and breasts.  You may urinate more often because the fetus is moving lower into your pelvis and pressing on your bladder.  You may develop or continue to have heartburn as a result of your pregnancy.  You may develop constipation because certain hormones are causing the muscles that push waste through your intestines to slow down.  You may develop hemorrhoids or swollen, bulging veins (varicose veins).  You may have pelvic pain because of the weight gain and pregnancy hormones relaxing your joints between the bones in your pelvis. Backaches may result from overexertion of the muscles supporting your posture.  You may have changes in your hair. These can include thickening of your hair, rapid growth, and changes in texture. Some women also have hair loss during or after pregnancy, or hair that feels dry or thin. Your hair will most likely return to normal after your baby is born.  Your breasts will continue to grow and be tender. A yellow discharge may leak from your breasts called colostrum.  Your belly button may stick out.  You may feel short of breath because of your expanding uterus.  You may notice the fetus "dropping," or moving lower in your abdomen.  You may have a bloody mucus discharge. This usually occurs a few days to a week before labor begins.  Your cervix becomes thin and soft (effaced) near your due date. WHAT TO EXPECT AT YOUR  PRENATAL EXAMS  You will have prenatal exams every 2 weeks until week 36. Then, you will have weekly prenatal exams. During a routine prenatal visit:  You will be weighed to make sure you and the fetus are growing normally.  Your blood pressure is taken.  Your abdomen will be measured to track your baby's growth.  The fetal heartbeat will be listened to.  Any test results from the previous visit will be discussed.  You may have a cervical check near your due date to see if you have effaced. At around 36 weeks, your caregiver will check your cervix. At the same time, your caregiver will also perform a test on the secretions of the vaginal tissue. This test is to determine if a type of bacteria, Group B streptococcus, is present. Your caregiver will explain this further. Your caregiver may ask you:  What your birth plan is.  How you are feeling.  If you are feeling the baby move.  If you have had any abnormal symptoms, such as leaking fluid, bleeding, severe headaches, or abdominal cramping.  If you have any questions. Other tests or screenings that may be performed during your third trimester include:  Blood tests that check for low iron levels (anemia).  Fetal testing to check the health, activity level, and growth of the fetus. Testing is done if you have certain medical conditions or if there are problems during the pregnancy. FALSE LABOR You may feel small, irregular contractions that   eventually go away. These are called Braxton Hicks contractions, or false labor. Contractions may last for hours, days, or even weeks before true labor sets in. If contractions come at regular intervals, intensify, or become painful, it is best to be seen by your caregiver.  SIGNS OF LABOR   Menstrual-like cramps.  Contractions that are 5 minutes apart or less.  Contractions that start on the top of the uterus and spread down to the lower abdomen and back.  A sense of increased pelvic  pressure or back pain.  A watery or bloody mucus discharge that comes from the vagina. If you have any of these signs before the 37th week of pregnancy, call your caregiver right away. You need to go to the hospital to get checked immediately. HOME CARE INSTRUCTIONS   Avoid all smoking, herbs, alcohol, and unprescribed drugs. These chemicals affect the formation and growth of the baby.  Follow your caregiver's instructions regarding medicine use. There are medicines that are either safe or unsafe to take during pregnancy.  Exercise only as directed by your caregiver. Experiencing uterine cramps is a good sign to stop exercising.  Continue to eat regular, healthy meals.  Wear a good support bra for breast tenderness.  Do not use hot tubs, steam rooms, or saunas.  Wear your seat belt at all times when driving.  Avoid raw meat, uncooked cheese, cat litter boxes, and soil used by cats. These carry germs that can cause birth defects in the baby.  Take your prenatal vitamins.  Try taking a stool softener (if your caregiver approves) if you develop constipation. Eat more high-fiber foods, such as fresh vegetables or fruit and whole grains. Drink plenty of fluids to keep your urine clear or pale yellow.  Take warm sitz baths to soothe any pain or discomfort caused by hemorrhoids. Use hemorrhoid cream if your caregiver approves.  If you develop varicose veins, wear support hose. Elevate your feet for 15 minutes, 3-4 times a day. Limit salt in your diet.  Avoid heavy lifting, wear low heal shoes, and practice good posture.  Rest a lot with your legs elevated if you have leg cramps or low back pain.  Visit your dentist if you have not gone during your pregnancy. Use a soft toothbrush to brush your teeth and be gentle when you floss.  A sexual relationship may be continued unless your caregiver directs you otherwise.  Do not travel far distances unless it is absolutely necessary and only  with the approval of your caregiver.  Take prenatal classes to understand, practice, and ask questions about the labor and delivery.  Make a trial run to the hospital.  Pack your hospital bag.  Prepare the baby's nursery.  Continue to go to all your prenatal visits as directed by your caregiver. SEEK MEDICAL CARE IF:  You are unsure if you are in labor or if your water has broken.  You have dizziness.  You have mild pelvic cramps, pelvic pressure, or nagging pain in your abdominal area.  You have persistent nausea, vomiting, or diarrhea.  You have a bad smelling vaginal discharge.  You have pain with urination. SEEK IMMEDIATE MEDICAL CARE IF:   You have a fever.  You are leaking fluid from your vagina.  You have spotting or bleeding from your vagina.  You have severe abdominal cramping or pain.  You have rapid weight loss or gain.  You have shortness of breath with chest pain.  You notice sudden or extreme swelling   of your face, hands, ankles, feet, or legs.  You have not felt your baby move in over an hour.  You have severe headaches that do not go away with medicine.  You have vision changes. Document Released: 10/04/2001 Document Revised: 10/15/2013 Document Reviewed: 12/11/2012 [employer] Patient Information 2015 [employer]. This information is not intended to replace advice given to you by your health care provider. Make sure you discuss any questions you have with your health care provider.  Breastfeeding Deciding to breastfeed is one of the best choices you can make for you and your baby. A change in hormones during pregnancy causes your breast tissue to grow and increases the number and size of your milk ducts. These hormones also allow proteins, sugars, and fats from your blood supply to make breast milk in your milk-producing glands. Hormones prevent breast milk from being released before your baby is born as well as prompt milk flow after birth. Once  breastfeeding has begun, thoughts of your baby, as well as his or her sucking or crying, can stimulate the release of milk from your milk-producing glands.  BENEFITS OF BREASTFEEDING For Your Baby  Your first milk (colostrum) helps your baby's digestive system function better.   There are antibodies in your milk that help your baby fight off infections.   Your baby has a lower incidence of asthma, allergies, and sudden infant death syndrome.   The nutrients in breast milk are better for your baby than infant formulas and are designed uniquely for your baby's needs.   Breast milk improves your baby's brain development.   Your baby is less likely to develop other conditions, such as childhood obesity, asthma, or type 2 diabetes mellitus.  For You   Breastfeeding helps to create a very special bond between you and your baby.   Breastfeeding is convenient. Breast milk is always available at the correct temperature and costs nothing.   Breastfeeding helps to burn calories and helps you lose the weight gained during pregnancy.   Breastfeeding makes your uterus contract to its prepregnancy size faster and slows bleeding (lochia) after you give birth.   Breastfeeding helps to lower your risk of developing type 2 diabetes mellitus, osteoporosis, and breast or ovarian cancer later in life. SIGNS THAT YOUR BABY IS HUNGRY Early Signs of Hunger  Increased alertness or activity.  Stretching.  Movement of the head from side to side.  Movement of the head and opening of the mouth when the corner of the mouth or cheek is stroked (rooting).  Increased sucking sounds, smacking lips, cooing, sighing, or squeaking.  Hand-to-mouth movements.  Increased sucking of fingers or hands. Late Signs of Hunger  Fussing.  Intermittent crying. Extreme Signs of Hunger Signs of extreme hunger will require calming and consoling before your baby will be able to breastfeed successfully. Do not  wait for the following signs of extreme hunger to occur before you initiate breastfeeding:   Restlessness.  A loud, strong cry.   Screaming. BREASTFEEDING BASICS Breastfeeding Initiation  Find a comfortable place to sit or lie down, with your neck and back well supported.  Place a pillow or rolled up blanket under your baby to bring him or her to the level of your breast (if you are seated). Nursing pillows are specially designed to help support your arms and your baby while you breastfeed.  Make sure that your baby's abdomen is facing your abdomen.   Gently massage your breast. With your fingertips, massage from your chest   wall toward your nipple in a circular motion. This encourages milk flow. You may need to continue this action during the feeding if your milk flows slowly.  Support your breast with 4 fingers underneath and your thumb above your nipple. Make sure your fingers are well away from your nipple and your baby's mouth.   Stroke your baby's lips gently with your finger or nipple.   When your baby's mouth is open wide enough, quickly bring your baby to your breast, placing your entire nipple and as much of the colored area around your nipple (areola) as possible into your baby's mouth.   More areola should be visible above your baby's upper lip than below the lower lip.   Your baby's tongue should be between his or her lower gum and your breast.   Ensure that your baby's mouth is correctly positioned around your nipple (latched). Your baby's lips should create a seal on your breast and be turned out (everted).  It is common for your baby to suck about 2-3 minutes in order to start the flow of breast milk. Latching Teaching your baby how to latch on to your breast properly is very important. An improper latch can cause nipple pain and decreased milk supply for you and poor weight gain in your baby. Also, if your baby is not latched onto your nipple properly, he or she  may swallow some air during feeding. This can make your baby fussy. Burping your baby when you switch breasts during the feeding can help to get rid of the air. However, teaching your baby to latch on properly is still the best way to prevent fussiness from swallowing air while breastfeeding. Signs that your baby has successfully latched on to your nipple:    Silent tugging or silent sucking, without causing you pain.   Swallowing heard between every 3-4 sucks.    Muscle movement above and in front of his or her ears while sucking.  Signs that your baby has not successfully latched on to nipple:   Sucking sounds or smacking sounds from your baby while breastfeeding.  Nipple pain. If you think your baby has not latched on correctly, slip your finger into the corner of your baby's mouth to break the suction and place it between your baby's gums. Attempt breastfeeding initiation again. Signs of Successful Breastfeeding Signs from your baby:   A gradual decrease in the number of sucks or complete cessation of sucking.   Falling asleep.   Relaxation of his or her body.   Retention of a small amount of milk in his or her mouth.   Letting go of your breast by himself or herself. Signs from you:  Breasts that have increased in firmness, weight, and size 1-3 hours after feeding.   Breasts that are softer immediately after breastfeeding.  Increased milk volume, as well as a change in milk consistency and color by the fifth day of breastfeeding.   Nipples that are not sore, cracked, or bleeding. Signs That Your Baby is Getting Enough Milk  Wetting at least 3 diapers in a 24-hour period. The urine should be clear and pale yellow by age 5 days.  At least 3 stools in a 24-hour period by age 5 days. The stool should be soft and yellow.  At least 3 stools in a 24-hour period by age 7 days. The stool should be seedy and yellow.  No loss of weight greater than 10% of birth weight  during the first 3   days of age.  Average weight gain of 4-7 ounces (113-198 g) per week after age 4 days.  Consistent daily weight gain by age 5 days, without weight loss after the age of 2 weeks. After a feeding, your baby may spit up a small amount. This is common. BREASTFEEDING FREQUENCY AND DURATION Frequent feeding will help you make more milk and can prevent sore nipples and breast engorgement. Breastfeed when you feel the need to reduce the fullness of your breasts or when your baby shows signs of hunger. This is called "breastfeeding on demand." Avoid introducing a pacifier to your baby while you are working to establish breastfeeding (the first 4-6 weeks after your baby is born). After this time you may choose to use a pacifier. Research has shown that pacifier use during the first year of a baby's life decreases the risk of sudden infant death syndrome (SIDS). Allow your baby to feed on each breast as long as he or she wants. Breastfeed until your baby is finished feeding. When your baby unlatches or falls asleep while feeding from the first breast, offer the second breast. Because newborns are often sleepy in the first few weeks of life, you may need to awaken your baby to get him or her to feed. Breastfeeding times will vary from baby to baby. However, the following rules can serve as a guide to help you ensure that your baby is properly fed:  Newborns (babies 4 weeks of age or younger) may breastfeed every 1-3 hours.  Newborns should not go longer than 3 hours during the day or 5 hours during the night without breastfeeding.  You should breastfeed your baby a minimum of 8 times in a 24-hour period until you begin to introduce solid foods to your baby at around 6 months of age. BREAST MILK PUMPING Pumping and storing breast milk allows you to ensure that your baby is exclusively fed your breast milk, even at times when you are unable to breastfeed. This is especially important if you are  going back to work while you are still breastfeeding or when you are not able to be present during feedings. Your lactation consultant can give you guidelines on how long it is safe to store breast milk.  A breast pump is a machine that allows you to pump milk from your breast into a sterile bottle. The pumped breast milk can then be stored in a refrigerator or freezer. Some breast pumps are operated by hand, while others use electricity. Ask your lactation consultant which type will work best for you. Breast pumps can be purchased, but some hospitals and breastfeeding support groups lease breast pumps on a monthly basis. A lactation consultant can teach you how to hand express breast milk, if you prefer not to use a pump.  CARING FOR YOUR BREASTS WHILE YOU BREASTFEED Nipples can become dry, cracked, and sore while breastfeeding. The following recommendations can help keep your breasts moisturized and healthy:  Avoid using soap on your nipples.   Wear a supportive bra. Although not required, special nursing bras and tank tops are designed to allow access to your breasts for breastfeeding without taking off your entire bra or top. Avoid wearing underwire-style bras or extremely tight bras.  Air dry your nipples for 3-4minutes after each feeding.   Use only cotton bra pads to absorb leaked breast milk. Leaking of breast milk between feedings is normal.   Use lanolin on your nipples after breastfeeding. Lanolin helps to maintain your skin's   normal moisture barrier. If you use pure lanolin, you do not need to wash it off before feeding your baby again. Pure lanolin is not toxic to your baby. You may also hand express a few drops of breast milk and gently massage that milk into your nipples and allow the milk to air dry. In the first few weeks after giving birth, some women experience extremely full breasts (engorgement). Engorgement can make your breasts feel heavy, warm, and tender to the touch.  Engorgement peaks within 3-5 days after you give birth. The following recommendations can help ease engorgement:  Completely empty your breasts while breastfeeding or pumping. You may want to start by applying warm, moist heat (in the shower or with warm water-soaked hand towels) just before feeding or pumping. This increases circulation and helps the milk flow. If your baby does not completely empty your breasts while breastfeeding, pump any extra milk after he or she is finished.  Wear a snug bra (nursing or regular) or tank top for 1-2 days to signal your body to slightly decrease milk production.  Apply ice packs to your breasts, unless this is too uncomfortable for you.  Make sure that your baby is latched on and positioned properly while breastfeeding. If engorgement persists after 48 hours of following these recommendations, contact your health care provider or a lactation consultant. OVERALL HEALTH CARE RECOMMENDATIONS WHILE BREASTFEEDING  Eat healthy foods. Alternate between meals and snacks, eating 3 of each per day. Because what you eat affects your breast milk, some of the foods may make your baby more irritable than usual. Avoid eating these foods if you are sure that they are negatively affecting your baby.  Drink milk, fruit juice, and water to satisfy your thirst (about 10 glasses a day).   Rest often, relax, and continue to take your prenatal vitamins to prevent fatigue, stress, and anemia.  Continue breast self-awareness checks.  Avoid chewing and smoking tobacco.  Avoid alcohol and drug use. Some medicines that may be harmful to your baby can pass through breast milk. It is important to ask your health care provider before taking any medicine, including all over-the-counter and prescription medicine as well as vitamin and herbal supplements. It is possible to become pregnant while breastfeeding. If birth control is desired, ask your health care provider about options that  will be safe for your baby. SEEK MEDICAL CARE IF:   You feel like you want to stop breastfeeding or have become frustrated with breastfeeding.  You have painful breasts or nipples.  Your nipples are cracked or bleeding.  Your breasts are red, tender, or warm.  You have a swollen area on either breast.  You have a fever or chills.  You have nausea or vomiting.  You have drainage other than breast milk from your nipples.  Your breasts do not become full before feedings by the fifth day after you give birth.  You feel sad and depressed.  Your baby is too sleepy to eat well.  Your baby is having trouble sleeping.   Your baby is wetting less than 3 diapers in a 24-hour period.  Your baby has less than 3 stools in a 24-hour period.  Your baby's skin or the white part of his or her eyes becomes yellow.   Your baby is not gaining weight by 5 days of age. SEEK IMMEDIATE MEDICAL CARE IF:   Your baby is overly tired (lethargic) and does not want to wake up and feed.  Your baby   develops an unexplained fever. Document Released: 10/10/2005 Document Revised: 10/15/2013 Document Reviewed: 04/03/2013 [employer] Patient Information 2015 [employer]. This information is not intended to replace advice given to you by your health care provider. Make sure you discuss any questions you have with your health care provider.  

## 2015-05-11 LAB — METHADONE (GC/LC/MS), URINE
EDDPUC: 29531 ng/mL — AB (ref <100)
Methadone (GC/LC/MS), ur confirm: 2853 ng/mL — AB (ref <100)

## 2015-05-12 LAB — PRESCRIPTION MONITORING PROFILE (SOLSTAS)
Amphetamine/Meth: NEGATIVE ng/mL
BARBITURATE SCREEN, URINE: NEGATIVE ng/mL
BUPRENORPHINE, URINE: NEGATIVE ng/mL
Benzodiazepine Screen, Urine: NEGATIVE ng/mL
COCAINE METABOLITES: NEGATIVE ng/mL
Cannabinoid Scrn, Ur: NEGATIVE ng/mL
Carisoprodol, Urine: NEGATIVE ng/mL
Creatinine, Urine: 114.52 mg/dL (ref >20.0)
ECSTASY: NEGATIVE ng/mL
Fentanyl, Ur: NEGATIVE ng/mL
MEPERIDINE UR: NEGATIVE ng/mL
Nitrites, Initial: NEGATIVE ug/mL
OXYCODONE SCRN UR: NEGATIVE ng/mL
Opiate Screen, Urine: NEGATIVE ng/mL
Propoxyphene: NEGATIVE ng/mL
Tapentadol, urine: NEGATIVE ng/mL
Tramadol Scrn, Ur: NEGATIVE ng/mL
Zolpidem, Urine: NEGATIVE ng/mL
pH, Initial: 7.2 pH (ref 4.5–8.9)

## 2015-05-14 ENCOUNTER — Ambulatory Visit (HOSPITAL_COMMUNITY)
Admission: RE | Admit: 2015-05-14 | Discharge: 2015-05-14 | Disposition: A | Discharge status: Home / Self Care | Payer: Medicaid Other | Source: Ambulatory Visit | Attending: Obstetrics & Gynecology | Admitting: Obstetrics & Gynecology

## 2015-05-14 ENCOUNTER — Encounter (HOSPITAL_COMMUNITY): Payer: Self-pay

## 2015-05-14 DIAGNOSIS — O99320 Drug use complicating pregnancy, unspecified trimester: Secondary | ICD-10-CM | POA: Diagnosis present

## 2015-05-14 DIAGNOSIS — F1120 Opioid dependence, uncomplicated: Secondary | ICD-10-CM | POA: Diagnosis not present

## 2015-05-14 DIAGNOSIS — Z369 Encounter for antenatal screening, unspecified: Secondary | ICD-10-CM | POA: Insufficient documentation

## 2015-05-14 NOTE — ED Notes (Signed)
Pt reports having 4 loose stools a day for 2 weeks.  Taking imodium, no improvement.

## 2015-05-21 ENCOUNTER — Encounter: Payer: Medicaid Other | Admitting: Obstetrics & Gynecology

## 2015-05-28 ENCOUNTER — Ambulatory Visit (INDEPENDENT_AMBULATORY_CARE_PROVIDER_SITE_OTHER): Payer: Medicaid Other | Admitting: Obstetrics & Gynecology

## 2015-05-28 VITALS — BP 131/63 | HR 72 | Temp 98.4°F | Wt 159.8 lb

## 2015-05-28 DIAGNOSIS — O0933 Supervision of pregnancy with insufficient antenatal care, third trimester: Secondary | ICD-10-CM | POA: Diagnosis present

## 2015-05-28 DIAGNOSIS — F1910 Other psychoactive substance abuse, uncomplicated: Secondary | ICD-10-CM

## 2015-05-28 DIAGNOSIS — O99323 Drug use complicating pregnancy, third trimester: Secondary | ICD-10-CM | POA: Diagnosis not present

## 2015-05-28 LAB — OB RESULTS CONSOLE GC/CHLAMYDIA
Chlamydia: NEGATIVE
Gonorrhea: NEGATIVE

## 2015-05-28 LAB — POCT URINALYSIS DIP (DEVICE)
Bilirubin Urine: NEGATIVE
Glucose, UA: NEGATIVE mg/dL
Hgb urine dipstick: NEGATIVE
Ketones, ur: NEGATIVE mg/dL
Leukocytes, UA: NEGATIVE
NITRITE: NEGATIVE
PROTEIN: NEGATIVE mg/dL
Specific Gravity, Urine: 1.015 (ref 1.005–1.030)
Urobilinogen, UA: 0.2 mg/dL (ref 0.0–1.0)
pH: 7 (ref 5.0–8.0)

## 2015-05-28 LAB — OB RESULTS CONSOLE GBS: GBS: NEGATIVE

## 2015-05-28 NOTE — Progress Notes (Signed)
States has a lot of swelling at times, and notices can't pee at times , then eventually pees.

## 2015-05-28 NOTE — Patient Instructions (Signed)

## 2015-05-28 NOTE — Progress Notes (Signed)
Subjective:cant sleep at night  [name] [name] is a 34 y.o. [id number] at [redacted]w[redacted]d being seen today for ongoing prenatal care.  Patient reports fatigue and occasional contractions.  Contractions: Irregular.  Vag. Bleeding: None. Movement: Present. Denies leaking of fluid.   The following portions of the patient's history were reviewed and updated as appropriate: allergies, current medications, past family history, past medical history, past social history, past surgical history and problem list.   Objective:   Filed Vitals:   05/28/15 0912  BP: 131/63  Pulse: 72  Temp: 98.4 F (36.9 C)  Weight: 159 lb 12.8 oz (72.485 kg)    Fetal Status: Fetal Heart Rate (bpm): 135 Fundal Height: 37 cm Movement: Present     General:  Alert, oriented and cooperative. Patient is in no acute distress.  Skin: Skin is warm and dry. No rash noted.   Cardiovascular: Normal heart rate noted  Respiratory: Normal respiratory effort, no problems with respiration noted  Abdomen: Soft, gravid, appropriate for gestational age. Pain/Pressure: Present     Vaginal: Vag. Bleeding: None.       Cervix: Exam revealed        Extremities: Normal range of motion.  Edema: Mild pitting, slight indentation  Mental Status: Normal mood and affect. Normal behavior. Normal judgment and thought content.   Urinalysis: Urine Protein: Negative Urine Glucose: Negative  Assessment and Plan:  Pregnancy: [id number] at [redacted]w[redacted]d  1. Substance abuse affecting pregnancy in third trimester, antepartum Methadone 120 mg - GC/Chlamydia Probe Amp - Culture, beta strep (group b only)  Term labor symptoms and general obstetric precautions including but not limited to vaginal bleeding, contractions, leaking of fluid and fetal movement were reviewed in detail with the patient. Please refer to After Visit Summary for other counseling recommendations.  Return in about 1 week (around 06/04/2015).   Woodroe Mode, MD

## 2015-05-29 LAB — GC/CHLAMYDIA PROBE AMP
CT Probe RNA: NEGATIVE
GC Probe RNA: NEGATIVE

## 2015-05-30 LAB — CULTURE, BETA STREP (GROUP B ONLY)

## 2015-06-04 ENCOUNTER — Ambulatory Visit (INDEPENDENT_AMBULATORY_CARE_PROVIDER_SITE_OTHER): Payer: Medicaid Other | Admitting: Physician Assistant

## 2015-06-04 ENCOUNTER — Encounter: Payer: Self-pay | Admitting: Physician Assistant

## 2015-06-04 VITALS — BP 117/55 | HR 74 | Temp 98.1°F | Wt 162.8 lb

## 2015-06-04 DIAGNOSIS — F1120 Opioid dependence, uncomplicated: Secondary | ICD-10-CM

## 2015-06-04 DIAGNOSIS — O99320 Drug use complicating pregnancy, unspecified trimester: Secondary | ICD-10-CM

## 2015-06-04 DIAGNOSIS — O0933 Supervision of pregnancy with insufficient antenatal care, third trimester: Secondary | ICD-10-CM

## 2015-06-04 LAB — POCT URINALYSIS DIP (DEVICE)
Bilirubin Urine: NEGATIVE
Glucose, UA: NEGATIVE mg/dL
Hgb urine dipstick: NEGATIVE
KETONES UR: NEGATIVE mg/dL
NITRITE: NEGATIVE
PH: 6 (ref 5.0–8.0)
PROTEIN: NEGATIVE mg/dL
Specific Gravity, Urine: 1.02 (ref 1.005–1.030)
Urobilinogen, UA: 0.2 mg/dL (ref 0.0–1.0)

## 2015-06-04 NOTE — Progress Notes (Signed)
Breastfeeding tip of the week reviewed Pt complains of numbness in hands, pain in lower back Pt has questions concerning induction of labor Leukocytes: small

## 2015-06-04 NOTE — Patient Instructions (Signed)
Pain Relief During Labor and Delivery Everyone experiences pain differently, but labor causes severe pain for many women. The amount of pain you experience during labor and delivery depends on your pain tolerance, contraction strength, and your baby's size and position. There are many ways to prepare for and deal with the pain, including:   Taking prenatal classes to learn about labor and delivery. The more informed you are, the less anxious and afraid you may be. This can help lessen the pain.  Taking pain-relieving medicine during labor and delivery.  Learning breathing and relaxation techniques.  Taking a shower or bath.  Getting massaged.  Changing positions.  Placing an ice pack on your back. Discuss your pain control options with your health care provider during your prenatal visits.  WHAT ARE THE TWO TYPES OF PAIN-RELIEVING MEDICINES? 1. Analgesics. These are medicines that decrease pain without total loss of feeling or muscle movement. 2. Anesthetics. These are medicines that block all feeling, including pain. There can be minor side effects of both types, such as nausea, trouble concentrating, becoming sleepy, and lowering the heart rate of the baby. However, health care providers are careful to give doses that will not seriously affect the baby.  WHAT ARE THE SPECIFIC TYPES OF ANALGESICS AND ANESTHETICS? Systemic Analgesic Systemic pain medicines affect your whole body rather than focusing pain relief on the area of your body experiencing pain. This type of medicine is given either through an IV tube in your vein or by a shot (injection) into your muscle. This medicine will lessen your pain but will not stop it completely. It may also make you sleepy, but it will not make you lose consciousness.  Local Anesthetic Local anesthetic isused tonumb a small area of your body. The medicine is injected into the area of nerves that carry feeling to the vagina, vulva, or the area between  the vagina and anus (perineum).  General Anesthetic This type of medicine causes you to lose consciousness so you do not feel pain. It is usually used only in emergency situations during labor. It is given through an IV tube or face mask. Paracervical Block A paracervical block is a form of local anesthesia given during labor. Numbing medicine is injected into the right and left sides of the cervix and vagina. It helps to lessen the pain caused by contractions and stretching of the cervix. It may have to be given more than once.  Pudendal Block A pudendal block is another form of local anesthesia. It is used to relieve the pain associated with pushing or stretching of the perineum at the time of delivery. An injection is given deep through the vaginal wall into the pudendal nerve in the pelvis, numbing the perineum.  Epidural Anesthetic An epidural is an injection of numbing medicine given in the lower back and into the epidural space near your spinal cord. The epidural numbs the lower half of your body. You may be able to move your legs but will not be allowed to walk. Epidurals can be used for labor, delivery, or cesarean deliveries.  To prevent the medicine from wearing off, a small tube (catheter) may be threaded into the epidural space and taped in place to prevent it from slipping out. Medicine can then be given continuously in small doses through the tube until you deliver. Spinal Block A spinal block is similar to an epidural, but the medicine is injected into the spinal fluid, not the epidural space. A spinal block is only given  once. It starts to relieve pain quickly but lasts only 1-2 hours. Spinal blocks can also be used for cesarean deliveries.  Combined Spinal-Epidural Block Combined spinal-epidural blocks combine the benefits of both the spinal and epidural blocks. The spinal part acts quickly to relieve pain and the epidural provides continuous pain relief. Hydrotherapy Immersion in  warm water during labor may provide comfort and relaxation. It may also help to lessen pain, the use of anesthesia, and the length of labor. However, immersion in water during the delivery (water birth) may have some risk involved and studies to determine safety and risks are ongoing. If you are a healthy woman who is expecting an uncomplicated birth, talk with your health care provider to see if water birth is an option for you.  Document Released: 01/26/2009 Document Revised: 10/15/2013 Document Reviewed: 02/28/2013 [facility] Patient Information 2015 [place], [place]. This information is not intended to replace advice given to you by your health care provider. Make sure you discuss any questions you have with your health care provider.

## 2015-06-04 NOTE — Progress Notes (Signed)
Subjective:  Yaretzy L Cheek is a 34 y.o. [id number] at [redacted]w[redacted]d being seen today for ongoing prenatal care.  Patient reports backache, fatigue and occasional contractions.  Contractions: Irregular.  Vag. Bleeding: None. Movement: Present. Denies leaking of fluid.   The following portions of the patient's history were reviewed and updated as appropriate: allergies, current medications, past family history, past medical history, past social history, past surgical history and problem list.   Objective:   Filed Vitals:   06/04/15 1422  BP: 117/55  Pulse: 74  Temp: 98.1 F (36.7 C)  Weight: 162 lb 12.8 oz (73.846 kg)    Fetal Status: Fetal Heart Rate (bpm): 117   Movement: Present     General:  Alert, oriented and cooperative. Patient is in no acute distress.  Skin: Skin is warm and dry. No rash noted.   Cardiovascular: Normal heart rate noted  Respiratory: Normal respiratory effort, no problems with respiration noted  Abdomen: Soft, gravid, appropriate for gestational age. Pain/Pressure: Present     Pelvic: Vag. Bleeding: None Vag D/C Character: Mucous   Cervical exam performed        Extremities: Normal range of motion.  Edema: Trace  Mental Status: Normal mood and affect. Normal behavior. Normal judgment and thought content.   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  Continue to maintain with [facility] Pt became irate after being told our policy is to not strip membranes or induce labor prior to 39 weeks.  She cursed at this provider and walked out.    Term labor symptoms and general obstetric precautions including but not limited to vaginal bleeding, contractions, leaking of fluid and fetal movement were reviewed in detail with the patient. Please refer to After Visit Summary for other counseling recommendations.  Return in about 1 week (around 06/11/2015).   Paticia Stack, PA-C

## 2015-06-05 LAB — DRUG SCREEN, URINE
Amphetamine Screen, Ur: NEGATIVE
Barbiturate Quant, Ur: NEGATIVE
Benzodiazepines.: NEGATIVE
COCAINE METABOLITES: NEGATIVE
Creatinine,U: 95.04 mg/dL
Marijuana Metabolite: NEGATIVE
Methadone: POSITIVE — AB
Opiates: NEGATIVE
PHENCYCLIDINE (PCP): NEGATIVE
Propoxyphene: NEGATIVE

## 2015-06-05 LAB — ALCOHOL METABOLITE (ETG), URINE: Ethyl Glucuronide (EtG): NEGATIVE ng/mL

## 2015-06-07 ENCOUNTER — Other Ambulatory Visit: Payer: Self-pay | Admitting: Obstetrics and Gynecology

## 2015-06-11 ENCOUNTER — Ambulatory Visit (INDEPENDENT_AMBULATORY_CARE_PROVIDER_SITE_OTHER): Payer: Medicaid Other | Admitting: Family Medicine

## 2015-06-11 VITALS — BP 124/64 | HR 87 | Temp 98.6°F | Wt 163.1 lb

## 2015-06-11 DIAGNOSIS — O99320 Drug use complicating pregnancy, unspecified trimester: Secondary | ICD-10-CM

## 2015-06-11 DIAGNOSIS — O0933 Supervision of pregnancy with insufficient antenatal care, third trimester: Secondary | ICD-10-CM

## 2015-06-11 DIAGNOSIS — F1120 Opioid dependence, uncomplicated: Secondary | ICD-10-CM | POA: Diagnosis not present

## 2015-06-11 LAB — POCT URINALYSIS DIP (DEVICE)
Bilirubin Urine: NEGATIVE
Glucose, UA: NEGATIVE mg/dL
Hgb urine dipstick: NEGATIVE
KETONES UR: NEGATIVE mg/dL
Leukocytes, UA: NEGATIVE
Nitrite: NEGATIVE
PH: 6.5 (ref 5.0–8.0)
PROTEIN: NEGATIVE mg/dL
Specific Gravity, Urine: 1.02 (ref 1.005–1.030)
UROBILINOGEN UA: 0.2 mg/dL (ref 0.0–1.0)

## 2015-06-11 MED ORDER — PANTOPRAZOLE SODIUM 40 MG PO TBEC: 40.0000 mg | DELAYED_RELEASE_TABLET | Freq: Every day | ORAL | Status: AC

## 2015-06-11 MED ORDER — PROMETHAZINE HCL 12.5 MG PO TABS: 12.5000 mg | ORAL_TABLET | Freq: Four times a day (QID) | ORAL | Status: DC | PRN

## 2015-06-11 NOTE — Patient Instructions (Signed)
Third Trimester of Pregnancy The third trimester is from week 29 through week 42, months 7 through 9. The third trimester is a time when the fetus is growing rapidly. At the end of the ninth month, the fetus is about 20 inches in length and weighs 6-10 pounds.  BODY CHANGES Your body goes through many changes during pregnancy. The changes vary from woman to woman.   Your weight will continue to increase. You can expect to gain 25-35 pounds (11-16 kg) by the end of the pregnancy.  You may begin to get stretch marks on your hips, abdomen, and breasts.  You may urinate more often because the fetus is moving lower into your pelvis and pressing on your bladder.  You may develop or continue to have heartburn as a result of your pregnancy.  You may develop constipation because certain hormones are causing the muscles that push waste through your intestines to slow down.  You may develop hemorrhoids or swollen, bulging veins (varicose veins).  You may have pelvic pain because of the weight gain and pregnancy hormones relaxing your joints between the bones in your pelvis. Backaches may result from overexertion of the muscles supporting your posture.  You may have changes in your hair. These can include thickening of your hair, rapid growth, and changes in texture. Some women also have hair loss during or after pregnancy, or hair that feels dry or thin. Your hair will most likely return to normal after your baby is born.  Your breasts will continue to grow and be tender. A yellow discharge may leak from your breasts called colostrum.  Your belly button may stick out.  You may feel short of breath because of your expanding uterus.  You may notice the fetus "dropping," or moving lower in your abdomen.  You may have a bloody mucus discharge. This usually occurs a few days to a week before labor begins.  Your cervix becomes thin and soft (effaced) near your due date. WHAT TO EXPECT AT YOUR PRENATAL  EXAMS  You will have prenatal exams every 2 weeks until week 36. Then, you will have weekly prenatal exams. During a routine prenatal visit:  You will be weighed to make sure you and the fetus are growing normally.  Your blood pressure is taken.  Your abdomen will be measured to track your baby's growth.  The fetal heartbeat will be listened to.  Any test results from the previous visit will be discussed.  You may have a cervical check near your due date to see if you have effaced. At around 36 weeks, your caregiver will check your cervix. At the same time, your caregiver will also perform a test on the secretions of the vaginal tissue. This test is to determine if a type of bacteria, Group B streptococcus, is present. Your caregiver will explain this further. Your caregiver may ask you:  What your birth plan is.  How you are feeling.  If you are feeling the baby move.  If you have had any abnormal symptoms, such as leaking fluid, bleeding, severe headaches, or abdominal cramping.  If you have any questions. Other tests or screenings that may be performed during your third trimester include:  Blood tests that check for low iron levels (anemia).  Fetal testing to check the health, activity level, and growth of the fetus. Testing is done if you have certain medical conditions or if there are problems during the pregnancy. FALSE LABOR You may feel small, irregular contractions that   eventually go away. These are called Braxton Hicks contractions, or false labor. Contractions may last for hours, days, or even weeks before true labor sets in. If contractions come at regular intervals, intensify, or become painful, it is best to be seen by your caregiver.  SIGNS OF LABOR   Menstrual-like cramps.  Contractions that are 5 minutes apart or less.  Contractions that start on the top of the uterus and spread down to the lower abdomen and back.  A sense of increased pelvic pressure or back  pain.  A watery or bloody mucus discharge that comes from the vagina. If you have any of these signs before the 37th week of pregnancy, call your caregiver right away. You need to go to the hospital to get checked immediately. HOME CARE INSTRUCTIONS   Avoid all smoking, herbs, alcohol, and unprescribed drugs. These chemicals affect the formation and growth of the baby.  Follow your caregiver's instructions regarding medicine use. There are medicines that are either safe or unsafe to take during pregnancy.  Exercise only as directed by your caregiver. Experiencing uterine cramps is a good sign to stop exercising.  Continue to eat regular, healthy meals.  Wear a good support bra for breast tenderness.  Do not use hot tubs, steam rooms, or saunas.  Wear your seat belt at all times when driving.  Avoid raw meat, uncooked cheese, cat litter boxes, and soil used by cats. These carry germs that can cause birth defects in the baby.  Take your prenatal vitamins.  Try taking a stool softener (if your caregiver approves) if you develop constipation. Eat more high-fiber foods, such as fresh vegetables or fruit and whole grains. Drink plenty of fluids to keep your urine clear or pale yellow.  Take warm sitz baths to soothe any pain or discomfort caused by hemorrhoids. Use hemorrhoid cream if your caregiver approves.  If you develop varicose veins, wear support hose. Elevate your feet for 15 minutes, 3-4 times a day. Limit salt in your diet.  Avoid heavy lifting, wear low heal shoes, and practice good posture.  Rest a lot with your legs elevated if you have leg cramps or low back pain.  Visit your dentist if you have not gone during your pregnancy. Use a soft toothbrush to brush your teeth and be gentle when you floss.  A sexual relationship may be continued unless your caregiver directs you otherwise.  Do not travel far distances unless it is absolutely necessary and only with the approval  of your caregiver.  Take prenatal classes to understand, practice, and ask questions about the labor and delivery.  Make a trial run to the hospital.  Pack your hospital bag.  Prepare the baby's nursery.  Continue to go to all your prenatal visits as directed by your caregiver. SEEK MEDICAL CARE IF:  You are unsure if you are in labor or if your water has broken.  You have dizziness.  You have mild pelvic cramps, pelvic pressure, or nagging pain in your abdominal area.  You have persistent nausea, vomiting, or diarrhea.  You have a bad smelling vaginal discharge.  You have pain with urination. SEEK IMMEDIATE MEDICAL CARE IF:   You have a fever.  You are leaking fluid from your vagina.  You have spotting or bleeding from your vagina.  You have severe abdominal cramping or pain.  You have rapid weight loss or gain.  You have shortness of breath with chest pain.  You notice sudden or extreme swelling   of your face, hands, ankles, feet, or legs.  You have not felt your baby move in over an hour.  You have severe headaches that do not go away with medicine.  You have vision changes. Document Released: 10/04/2001 Document Revised: 10/15/2013 Document Reviewed: 12/11/2012 [employer] Patient Information 2015 [employer]. This information is not intended to replace advice given to you by your health care provider. Make sure you discuss any questions you have with your health care provider.  

## 2015-06-11 NOTE — Progress Notes (Signed)
Patient reports she hasn't felt fetal movement this morning but felt the baby all through the night; feels like the baby is getting smaller

## 2015-06-11 NOTE — Progress Notes (Signed)
Subjective:  [name] [name] is a 34 y.o. [id number] at [redacted]w[redacted]d being seen today for ongoing prenatal care.  Patient reports no complaints.  Contractions: Irregular.  Vag. Bleeding: None. Movement: (!) Decreased. Denies leaking of fluid.   The following portions of the patient's history were reviewed and updated as appropriate: allergies, current medications, past family history, past medical history, past social history, past surgical history and problem list.   Objective:   Filed Vitals:   06/11/15 1140  BP: 124/64  Pulse: 87  Temp: 98.6 F (37 C)  Weight: 163 lb 1.6 oz (73.982 kg)    Fetal Status: Fetal Heart Rate (bpm): 115   Movement: Present     General:  Alert, oriented and cooperative. Patient is in no acute distress.  Skin: Skin is warm and dry. No rash noted.   Cardiovascular: Normal heart rate noted  Respiratory: Normal respiratory effort, no problems with respiration noted  Abdomen: Soft, gravid, appropriate for gestational age. Pain/Pressure: Present     Pelvic: Vag. Bleeding: None Vag D/C Character: Mucous   Cervical exam performed Dilation: 3 Effacement (%): 60 Station: -2  Extremities: Normal range of motion.  Edema: Mild pitting, slight indentation  Mental Status: Normal mood and affect. Normal behavior. Normal judgment and thought content.   Urinalysis:      Assessment and Plan:  Pregnancy: [id number] at [redacted]w[redacted]d  1. Methadone maintenance treatment affecting pregnancy, antepartum No withdrawal symptoms.    2. Supervision of high-risk pregnancy with insufficient prenatal care in third trimester FHT and FH normal. Patient wanting induction of labor now because patient had been induced at 37 weeks prior.  I discussed with her that no medical reason for induction early.  Discussed that it is in our practice to induce at 41 weeks.  Term labor symptoms and general obstetric precautions including but not limited to vaginal bleeding, contractions, leaking of fluid and fetal  movement were reviewed in detail with the patient. Please refer to After Visit Summary for other counseling recommendations.  Return in about 1 week (around 06/18/2015).   Truett Mainland, DO

## 2015-06-15 ENCOUNTER — Encounter: Payer: Medicaid Other | Admitting: Obstetrics and Gynecology

## 2015-06-18 ENCOUNTER — Inpatient Hospital Stay (HOSPITAL_COMMUNITY): Payer: Medicaid Other

## 2015-06-18 ENCOUNTER — Inpatient Hospital Stay (HOSPITAL_COMMUNITY): Payer: Medicaid Other | Admitting: Anesthesiology

## 2015-06-18 ENCOUNTER — Encounter: Payer: Medicaid Other | Admitting: Family Medicine

## 2015-06-18 ENCOUNTER — Encounter (HOSPITAL_COMMUNITY): Payer: Self-pay | Admitting: *Deleted

## 2015-06-18 ENCOUNTER — Inpatient Hospital Stay (HOSPITAL_COMMUNITY)
Admission: AD | Admit: 2015-06-18 | Discharge: 2015-06-19 | DRG: 767 | Disposition: A | Discharge status: Home / Self Care | Payer: Medicaid Other | Source: Ambulatory Visit | Attending: Obstetrics & Gynecology | Admitting: Obstetrics & Gynecology

## 2015-06-18 ENCOUNTER — Encounter (HOSPITAL_COMMUNITY)
Admission: AD | Disposition: A | Discharge status: Home / Self Care | Payer: Self-pay | Source: Ambulatory Visit | Attending: Obstetrics & Gynecology

## 2015-06-18 DIAGNOSIS — O9962 Diseases of the digestive system complicating childbirth: Secondary | ICD-10-CM | POA: Diagnosis present

## 2015-06-18 DIAGNOSIS — O0933 Supervision of pregnancy with insufficient antenatal care, third trimester: Secondary | ICD-10-CM

## 2015-06-18 DIAGNOSIS — O99334 Smoking (tobacco) complicating childbirth: Secondary | ICD-10-CM | POA: Diagnosis present

## 2015-06-18 DIAGNOSIS — F988 Other specified behavioral and emotional disorders with onset usually occurring in childhood and adolescence: Secondary | ICD-10-CM | POA: Diagnosis present

## 2015-06-18 DIAGNOSIS — K219 Gastro-esophageal reflux disease without esophagitis: Secondary | ICD-10-CM | POA: Diagnosis present

## 2015-06-18 DIAGNOSIS — Z3A40 40 weeks gestation of pregnancy: Secondary | ICD-10-CM

## 2015-06-18 DIAGNOSIS — Z8249 Family history of ischemic heart disease and other diseases of the circulatory system: Secondary | ICD-10-CM | POA: Diagnosis not present

## 2015-06-18 DIAGNOSIS — O09212 Supervision of pregnancy with history of pre-term labor, second trimester: Secondary | ICD-10-CM

## 2015-06-18 DIAGNOSIS — IMO0001 Reserved for inherently not codable concepts without codable children: Secondary | ICD-10-CM

## 2015-06-18 DIAGNOSIS — O09213 Supervision of pregnancy with history of pre-term labor, third trimester: Secondary | ICD-10-CM | POA: Diagnosis not present

## 2015-06-18 DIAGNOSIS — F419 Anxiety disorder, unspecified: Secondary | ICD-10-CM

## 2015-06-18 DIAGNOSIS — F17210 Nicotine dependence, cigarettes, uncomplicated: Secondary | ICD-10-CM | POA: Diagnosis present

## 2015-06-18 DIAGNOSIS — Z302 Encounter for sterilization: Secondary | ICD-10-CM | POA: Diagnosis present

## 2015-06-18 DIAGNOSIS — O09892 Supervision of other high risk pregnancies, second trimester: Secondary | ICD-10-CM

## 2015-06-18 DIAGNOSIS — F17200 Nicotine dependence, unspecified, uncomplicated: Secondary | ICD-10-CM | POA: Diagnosis present

## 2015-06-18 DIAGNOSIS — F1410 Cocaine abuse, uncomplicated: Secondary | ICD-10-CM | POA: Diagnosis present

## 2015-06-18 DIAGNOSIS — G894 Chronic pain syndrome: Secondary | ICD-10-CM | POA: Diagnosis present

## 2015-06-18 DIAGNOSIS — O99324 Drug use complicating childbirth: Secondary | ICD-10-CM | POA: Diagnosis present

## 2015-06-18 DIAGNOSIS — O99344 Other mental disorders complicating childbirth: Secondary | ICD-10-CM | POA: Diagnosis present

## 2015-06-18 DIAGNOSIS — F32A Depression, unspecified: Secondary | ICD-10-CM | POA: Diagnosis present

## 2015-06-18 DIAGNOSIS — F418 Other specified anxiety disorders: Secondary | ICD-10-CM

## 2015-06-18 DIAGNOSIS — O99323 Drug use complicating pregnancy, third trimester: Secondary | ICD-10-CM | POA: Diagnosis present

## 2015-06-18 DIAGNOSIS — F329 Major depressive disorder, single episode, unspecified: Secondary | ICD-10-CM | POA: Diagnosis present

## 2015-06-18 DIAGNOSIS — F1120 Opioid dependence, uncomplicated: Secondary | ICD-10-CM | POA: Diagnosis present

## 2015-06-18 DIAGNOSIS — F1110 Opioid abuse, uncomplicated: Secondary | ICD-10-CM | POA: Diagnosis present

## 2015-06-18 DIAGNOSIS — O99320 Drug use complicating pregnancy, unspecified trimester: Secondary | ICD-10-CM

## 2015-06-18 HISTORY — PX: TUBAL LIGATION: SHX77

## 2015-06-18 LAB — RAPID URINE DRUG SCREEN, HOSP PERFORMED
AMPHETAMINES: NOT DETECTED
Barbiturates: NOT DETECTED
Benzodiazepines: NOT DETECTED
Cocaine: NOT DETECTED
Opiates: NOT DETECTED
TETRAHYDROCANNABINOL: NOT DETECTED

## 2015-06-18 LAB — SURGICAL PCR SCREEN
MRSA, PCR: NEGATIVE
Staphylococcus aureus: NEGATIVE

## 2015-06-18 LAB — CBC
HCT: 30.8 % — ABNORMAL LOW (ref 36.0–46.0)
Hemoglobin: 10.4 g/dL — ABNORMAL LOW (ref 12.0–15.0)
MCH: 30.1 pg (ref 26.0–34.0)
MCHC: 33.8 g/dL (ref 30.0–36.0)
MCV: 89.3 fL (ref 78.0–100.0)
PLATELETS: 306 10*3/uL (ref 150–400)
RBC: 3.45 MIL/uL — ABNORMAL LOW (ref 3.87–5.11)
RDW: 14.2 % (ref 11.5–15.5)
WBC: 18.9 10*3/uL — ABNORMAL HIGH (ref 4.0–10.5)

## 2015-06-18 LAB — TYPE AND SCREEN
ABO/RH(D): O POS
Antibody Screen: NEGATIVE

## 2015-06-18 SURGERY — LIGATION, FALLOPIAN TUBE, POSTPARTUM
Anesthesia: Epidural | Site: Abdomen

## 2015-06-18 MED ORDER — OXYCODONE-ACETAMINOPHEN 5-325 MG PO TABS: 1.0000 | ORAL_TABLET | ORAL | Status: DC | PRN

## 2015-06-18 MED ORDER — LIDOCAINE-EPINEPHRINE (PF) 2 %-1:200000 IJ SOLN
INTRAMUSCULAR | Status: DC | PRN
  Administered 2015-06-18: 7 mL via EPIDURAL
  Administered 2015-06-18 (×2): 5 mL via EPIDURAL
  Administered 2015-06-18: 3 mL via EPIDURAL

## 2015-06-18 MED ORDER — 0.9 % SODIUM CHLORIDE (POUR BTL) OPTIME
TOPICAL | Status: DC | PRN
  Administered 2015-06-18: 1000 mL

## 2015-06-18 MED ORDER — SENNOSIDES-DOCUSATE SODIUM 8.6-50 MG PO TABS
2.0000 | ORAL_TABLET | ORAL | Status: DC
  Administered 2015-06-19: 2 via ORAL
  Filled 2015-06-18: qty 2

## 2015-06-18 MED ORDER — FENTANYL CITRATE (PF) 100 MCG/2ML IJ SOLN: 100.0000 ug | INTRAMUSCULAR | Status: DC | PRN

## 2015-06-18 MED ORDER — OXYTOCIN 10 UNIT/ML IJ SOLN
INTRAMUSCULAR | Status: AC
  Filled 2015-06-18: qty 1

## 2015-06-18 MED ORDER — LACTATED RINGERS IV SOLN
500.0000 mL | INTRAVENOUS | Status: DC | PRN
Start: 2015-06-18 — End: 2015-06-18
  Administered 2015-06-18: 1000 mL via INTRAVENOUS

## 2015-06-18 MED ORDER — CITRIC ACID-SODIUM CITRATE 334-500 MG/5ML PO SOLN
30.0000 mL | ORAL | Status: DC | PRN
Start: 2015-06-18 — End: 2015-06-18

## 2015-06-18 MED ORDER — FENTANYL CITRATE (PF) 100 MCG/2ML IJ SOLN: 25.0000 ug | INTRAMUSCULAR | Status: DC | PRN

## 2015-06-18 MED ORDER — ONDANSETRON HCL 4 MG/2ML IJ SOLN
INTRAMUSCULAR | Status: AC
  Filled 2015-06-18: qty 2

## 2015-06-18 MED ORDER — WITCH HAZEL-GLYCERIN EX PADS: 1.0000 "application " | MEDICATED_PAD | CUTANEOUS | Status: DC | PRN

## 2015-06-18 MED ORDER — MIDAZOLAM HCL 2 MG/2ML IJ SOLN
INTRAMUSCULAR | Status: AC
  Filled 2015-06-18: qty 4

## 2015-06-18 MED ORDER — ZOLPIDEM TARTRATE 5 MG PO TABS: 5.0000 mg | ORAL_TABLET | Freq: Every evening | ORAL | Status: DC | PRN

## 2015-06-18 MED ORDER — ACETAMINOPHEN 325 MG PO TABS
650.0000 mg | ORAL_TABLET | ORAL | Status: DC | PRN
  Administered 2015-06-19: 650 mg via ORAL
  Filled 2015-06-18: qty 2

## 2015-06-18 MED ORDER — ACETAMINOPHEN 160 MG/5ML PO SOLN: 975.0000 mg | Freq: Once | ORAL | Status: DC

## 2015-06-18 MED ORDER — METOCLOPRAMIDE HCL 10 MG PO TABS
10.0000 mg | ORAL_TABLET | Freq: Once | ORAL | Status: AC
  Administered 2015-06-18: 10 mg via ORAL
  Filled 2015-06-18: qty 1

## 2015-06-18 MED ORDER — DIPHENHYDRAMINE HCL 50 MG/ML IJ SOLN: 12.5000 mg | INTRAMUSCULAR | Status: DC | PRN

## 2015-06-18 MED ORDER — OXYTOCIN 40 UNITS IN LACTATED RINGERS INFUSION - SIMPLE MED
62.5000 mL/h | INTRAVENOUS | Status: DC
  Administered 2015-06-18: 62.5 mL/h via INTRAVENOUS

## 2015-06-18 MED ORDER — LIDOCAINE HCL (PF) 1 % IJ SOLN: 30.0000 mL | INTRAMUSCULAR | Status: DC | PRN

## 2015-06-18 MED ORDER — FENTANYL 2.5 MCG/ML BUPIVACAINE 1/10 % EPIDURAL INFUSION (WH - ANES)
INTRAMUSCULAR | Status: DC | PRN
  Administered 2015-06-18: 14 mL/h via EPIDURAL

## 2015-06-18 MED ORDER — EPHEDRINE 5 MG/ML INJ: 10.0000 mg | INTRAVENOUS | Status: DC | PRN

## 2015-06-18 MED ORDER — PHENYLEPHRINE 40 MCG/ML (10ML) SYRINGE FOR IV PUSH (FOR BLOOD PRESSURE SUPPORT)
80.0000 ug | PREFILLED_SYRINGE | INTRAVENOUS | Status: DC | PRN
  Filled 2015-06-18: qty 20

## 2015-06-18 MED ORDER — METHADONE HCL 10 MG/ML PO CONC
120.0000 mg | Freq: Every day | ORAL | Status: DC
  Administered 2015-06-19: 120 mg via ORAL
  Filled 2015-06-18 (×3): qty 12

## 2015-06-18 MED ORDER — LIDOCAINE HCL (PF) 1 % IJ SOLN
INTRAMUSCULAR | Status: AC
  Filled 2015-06-18: qty 30

## 2015-06-18 MED ORDER — DIPHENHYDRAMINE HCL 25 MG PO CAPS: 25.0000 mg | ORAL_CAPSULE | Freq: Four times a day (QID) | ORAL | Status: DC | PRN

## 2015-06-18 MED ORDER — FENTANYL 2.5 MCG/ML BUPIVACAINE 1/10 % EPIDURAL INFUSION (WH - ANES)
14.0000 mL/h | INTRAMUSCULAR | Status: DC | PRN
  Filled 2015-06-18: qty 125

## 2015-06-18 MED ORDER — LACTATED RINGERS IV SOLN
INTRAVENOUS | Status: DC
  Administered 2015-06-18: 07:00:00 via INTRAVENOUS

## 2015-06-18 MED ORDER — FENTANYL 2.5 MCG/ML BUPIVACAINE 1/10 % EPIDURAL INFUSION (WH - ANES): 14.0000 mL/h | INTRAMUSCULAR | Status: DC | PRN

## 2015-06-18 MED ORDER — BENZOCAINE-MENTHOL 20-0.5 % EX AERO: 1.0000 "application " | INHALATION_SPRAY | CUTANEOUS | Status: DC | PRN

## 2015-06-18 MED ORDER — BUPIVACAINE HCL (PF) 0.25 % IJ SOLN
INTRAMUSCULAR | Status: DC | PRN
  Administered 2015-06-18: 20 mL

## 2015-06-18 MED ORDER — OXYCODONE-ACETAMINOPHEN 5-325 MG PO TABS: 2.0000 | ORAL_TABLET | ORAL | Status: DC | PRN

## 2015-06-18 MED ORDER — PRENATAL MULTIVITAMIN CH
1.0000 | ORAL_TABLET | Freq: Every day | ORAL | Status: DC
  Administered 2015-06-19: 1 via ORAL
  Filled 2015-06-18: qty 1

## 2015-06-18 MED ORDER — LIDOCAINE HCL (PF) 1 % IJ SOLN
INTRAMUSCULAR | Status: DC | PRN
  Administered 2015-06-18 (×2): 5 mL

## 2015-06-18 MED ORDER — TETANUS-DIPHTH-ACELL PERTUSSIS 5-2.5-18.5 LF-MCG/0.5 IM SUSP: 0.5000 mL | Freq: Once | INTRAMUSCULAR | Status: DC

## 2015-06-18 MED ORDER — ONDANSETRON HCL 4 MG/2ML IJ SOLN: 4.0000 mg | Freq: Four times a day (QID) | INTRAMUSCULAR | Status: DC | PRN

## 2015-06-18 MED ORDER — OXYTOCIN BOLUS FROM INFUSION: 500.0000 mL | INTRAVENOUS | Status: DC

## 2015-06-18 MED ORDER — FAMOTIDINE 20 MG PO TABS
40.0000 mg | ORAL_TABLET | Freq: Once | ORAL | Status: AC
  Administered 2015-06-18: 40 mg via ORAL
  Filled 2015-06-18: qty 2

## 2015-06-18 MED ORDER — ONDANSETRON HCL 4 MG PO TABS: 4.0000 mg | ORAL_TABLET | ORAL | Status: DC | PRN

## 2015-06-18 MED ORDER — DIBUCAINE 1 % RE OINT: 1.0000 "application " | TOPICAL_OINTMENT | RECTAL | Status: DC | PRN

## 2015-06-18 MED ORDER — LACTATED RINGERS IV SOLN
INTRAVENOUS | Status: DC
  Administered 2015-06-18: 13:00:00 via INTRAVENOUS

## 2015-06-18 MED ORDER — ONDANSETRON HCL 4 MG/2ML IJ SOLN
4.0000 mg | INTRAMUSCULAR | Status: DC | PRN
  Administered 2015-06-18: 4 mg via INTRAVENOUS

## 2015-06-18 MED ORDER — LANOLIN HYDROUS EX OINT: TOPICAL_OINTMENT | CUTANEOUS | Status: DC | PRN

## 2015-06-18 MED ORDER — ACETAMINOPHEN 325 MG PO TABS: 650.0000 mg | ORAL_TABLET | ORAL | Status: DC | PRN

## 2015-06-18 MED ORDER — IBUPROFEN 600 MG PO TABS
600.0000 mg | ORAL_TABLET | Freq: Four times a day (QID) | ORAL | Status: DC
  Administered 2015-06-18 – 2015-06-19 (×5): 600 mg via ORAL
  Filled 2015-06-18 (×5): qty 1

## 2015-06-18 MED ORDER — OXYTOCIN 40 UNITS IN LACTATED RINGERS INFUSION - SIMPLE MED
INTRAVENOUS | Status: AC
  Administered 2015-06-18: 40 [IU]
  Filled 2015-06-18: qty 1000

## 2015-06-18 MED ORDER — MIDAZOLAM HCL 5 MG/5ML IJ SOLN
INTRAMUSCULAR | Status: DC | PRN
  Administered 2015-06-18: 2 mg via INTRAVENOUS

## 2015-06-18 MED ORDER — SIMETHICONE 80 MG PO CHEW: 80.0000 mg | CHEWABLE_TABLET | ORAL | Status: DC | PRN

## 2015-06-18 SURGICAL SUPPLY — 19 items
CHLORAPREP W/TINT 26ML (MISCELLANEOUS) ×3 IMPLANT
CLIP FILSHIE TUBAL LIGA STRL (Clip) ×3 IMPLANT
CLOTH BEACON ORANGE TIMEOUT ST (SAFETY) ×3 IMPLANT
DRSG OPSITE POSTOP 3X4 (GAUZE/BANDAGES/DRESSINGS) ×3 IMPLANT
GLOVE BIOGEL PI IND STRL 7.5 (GLOVE) ×1 IMPLANT
GLOVE BIOGEL PI INDICATOR 7.5 (GLOVE) ×2
GLOVE ECLIPSE 7.5 STRL STRAW (GLOVE) ×3 IMPLANT
GOWN STRL REUS W/TWL LRG LVL3 (GOWN DISPOSABLE) ×6 IMPLANT
LIQUID BAND (GAUZE/BANDAGES/DRESSINGS) ×2 IMPLANT
NEEDLE HYPO 22GX1.5 SAFETY (NEEDLE) ×3 IMPLANT
NS IRRIG 1000ML POUR BTL (IV SOLUTION) ×3 IMPLANT
PACK ABDOMINAL MINOR (CUSTOM PROCEDURE TRAY) ×3 IMPLANT
SPONGE LAP 4X18 X RAY DECT (DISPOSABLE) IMPLANT
SUT VICRYL 0 UR6 27IN ABS (SUTURE) ×3 IMPLANT
SUT VICRYL 4-0 PS2 18IN ABS (SUTURE) ×3 IMPLANT
SYR CONTROL 10ML LL (SYRINGE) ×3 IMPLANT
TOWEL OR 17X24 6PK STRL BLUE (TOWEL DISPOSABLE) ×6 IMPLANT
TRAY FOLEY CATH SILVER 14FR (SET/KITS/TRAYS/PACK) ×3 IMPLANT
WATER STERILE IRR 1000ML POUR (IV SOLUTION) ×3 IMPLANT

## 2015-06-18 NOTE — Anesthesia Preprocedure Evaluation (Addendum)
Anesthesia Evaluation  Patient identified by MRN, date of birth, ID band Patient awake and Patient confused    Reviewed: Allergy & Precautions, H&P , NPO status , Patient's Chart, lab work & pertinent test results  Airway Mallampati: II       Dental   Pulmonary Current Smoker,  breath sounds clear to auscultation  Pulmonary exam normal       Cardiovascular Exercise Tolerance: Good Normal cardiovascular examRhythm:regular Rate:Normal     Neuro/Psych    GI/Hepatic   Endo/Other    Renal/GU      Musculoskeletal   Abdominal   Peds  Hematology   Anesthesia Other Findings   Reproductive/Obstetrics negative OB ROS                            Anesthesia Physical  Anesthesia Plan  ASA: II  Anesthesia Plan: Epidural   Post-op Pain Management:    Induction:   Airway Management Planned:   Additional Equipment:   Intra-op Plan:   Post-operative Plan:   Informed Consent: I have reviewed the patients History and Physical, chart, labs and discussed the procedure including the risks, benefits and alternatives for the proposed anesthesia with the patient or authorized representative who has indicated his/her understanding and acceptance.     Plan Discussed with:   Anesthesia Plan Comments:         Anesthesia Quick Evaluation

## 2015-06-18 NOTE — Progress Notes (Signed)
Pharmacist-Physician Communication  Concern: A possible Drug-Drug Interaction  Description: Effects: Additive QT interval prolongation may occur during coadministration of ondansetron and methadone.  Mechanism: QT interval effects of each agent may be additive. Other mechanisms may exist.    Recommendation(s):  Management: ECG monitoring is recommended for patients coprescribed methadone and ondansetron.    Thank You,

## 2015-06-18 NOTE — Anesthesia Procedure Notes (Addendum)
Epidural Patient location during procedure: OB Start time: 06/18/2015 6:42 AM End time: 06/18/2015 7:00 AM  Staffing Anesthesiologist: Alexis Frock  Preanesthetic Checklist Completed: patient identified, site marked, surgical consent, pre-op evaluation, timeout performed, IV checked, risks and benefits discussed and monitors and equipment checked  Epidural Patient position: sitting Prep: site prepped and draped and DuraPrep Patient monitoring: heart rate, continuous pulse ox and blood pressure Approach: midline Location: L3-L4 Injection technique: LOR air  Needle:  Needle type: Tuohy  Needle gauge: 17 G Needle length: 9 cm and 9 Needle insertion depth: 5 cm Catheter type: closed end flexible Catheter size: 19 Gauge Catheter at skin depth: 14 cm Test dose: negative  Assessment Events: blood not aspirated, injection not painful, no injection resistance, negative IV test and no paresthesia  Additional Notes   Patient tolerated the insertion well without complications.Reason for block:procedure for pain

## 2015-06-18 NOTE — H&P (Signed)
LABOR ADMISSION HISTORY AND PHYSICAL  [name] [name] is a 33 y.o. female [id number] with IUP at [redacted]w[redacted]d by 3rd trimester [place] presenting for SOL. Patient states she started having painful contractions this morning.  She reports +FM, + contractions.  She plans on breast feeding. She request BTL for birth control.  Of note, patient on methadone. Took a dose this morning.   Dating: By 31wk Korea --->  Estimated Date of Delivery: 06/17/15   Clinic [facility] Prenatal Labs  Dating 31wk [place] Blood type: O pos  Genetic Screen Too late  Antibody: neg  Anatomic [place] Normal Rubella: immune  GTT Early: Third trimester: 102 RPR: NR  Flu vaccine  HBsAg: Neg  TDaP vaccine 04/14/2015  HIV: NR  GBS  negative (For PCN allergy, check sensitivities) GBS: -  Baby Food  Pap:  Contraception PPS, consent 04/14/2015   Circumcision Desires-states mom will help pay for it   Pediatrician [facility]   Support Person        Prenatal History/Complications: -substance use and tobacco use during pregnancy -late to prenatal care at 25wks  Past Medical History: Past Medical History  Diagnosis Date  . Mental disorder   . Headache(784.0)   . Preterm labor   . Fracture of spinal vertebra 2009    Surgery in 2011  . Hemorrhoids thrombosed   . Anxiety   . Postpartum depression 11/27/2011  . PHARYNGITIS 10/13/2010    Qualifier: Diagnosis of  By: Charlett Blake MD, Erline Levine    . Acute bronchitis 10/13/2010    Qualifier: Diagnosis of  By: Charlett Blake MD, Erline Levine    . Anxiety and depression 11/27/2011  . Nausea & vomiting 07/10/2012  . Arthritis of spine 08/16/2010    Qualifier: Diagnosis of  By: Charlett Blake MD, Erline Levine    . Insomnia 07/13/2013  . ADD (attention deficit disorder) 06/18/2014  . Seizures     Last seizure 2005    Past Surgical History: Past Surgical History  Procedure Laterality Date  . Cervical fusion  05/26/2011    She thinks it was C2  .  Wisdom tooth extraction      Obstetrical History: OB History    Gravida Para Term Preterm AB TAB SAB Ectopic Multiple Living   5 4 3 1      4       Social History: Social History   Social History  . Marital Status: Single    Spouse Name: N/A  . Number of Children: 3  . Years of Education: N/A   Occupational History  . Student     Full time [employer]   Social History Main Topics  . Smoking status: Current Every Day Smoker -- 0.50 packs/day for 17 years    Types: Cigarettes  . Smokeless tobacco: Never Used  . Alcohol Use: No  . Drug Use: 5.00 per week    Special: Heroin     Comment: Now on methadone  . Sexual Activity:    Partners: Male    Patent examiner Protection: None   Other Topics Concern  . None   Social History Narrative    Family History: Family History  Problem Relation Age of Onset  . Arthritis Mother   . Hypertension Mother   . Coronary artery disease Father     s/p MIs/ smoker  . Alcohol abuse Father   . Heart attack Father   . Endometriosis Sister   . Aplastic anemia Maternal Grandmother   . Arthritis Maternal Grandmother     s/p hip replacement  for arthritis  . Anemia Maternal Grandmother     aplastic  . COPD Maternal Grandmother   . Heart disease Maternal Grandmother   . Scoliosis Maternal Grandfather     s/p surgeries  . Endometriosis Sister   . Cancer Sister     cervical  . Hypertension Sister   . Otitis media Daughter     Allergies: Allergies  Allergen Reactions  . Cephalexin Diarrhea and Nausea And Vomiting    Prescriptions prior to admission  Medication Sig Dispense Refill Last Dose  . albuterol (PROVENTIL HFA;VENTOLIN HFA) 108 (90 BASE) MCG/ACT inhaler Inhale 2 puffs into the lungs every 6 (six) hours as needed for wheezing or shortness of breath. 1 Inhaler 2 Taking  . cyclobenzaprine (FLEXERIL) 5 MG tablet TAKE 1 TABLET BY MOUTH 3 TIMES A DAY AS NEEDED FOR MUSCLE SPASMS  0 Taking  . Loperamide HCl (IMODIUM PO) Take by mouth.    Taking  . methadone (DOLOPHINE) 10 MG/ML solution Take 120 mg by mouth daily.    Taking  . pantoprazole (PROTONIX) 40 MG tablet Take 1 tablet (40 mg total) by mouth daily. 30 tablet 2   . Prenat w/o A-FeCbGl-DSS-FA-DHA (CITRANATAL ASSURE) 35-1 & 300 MG tablet Take 1 tablet by mouth daily. 90 tablet 3 Taking  . promethazine (PHENERGAN) 12.5 MG tablet Take 1 tablet (12.5 mg total) by mouth every 6 (six) hours as needed for nausea or vomiting. 30 tablet 0   . sucralfate (CARAFATE) 1 GM/10ML suspension Take 10 mLs (1 g total) by mouth 4 (four) times daily -  with meals and at bedtime. 420 mL 0 Taking    Review of Systems  All systems reviewed and negative except as stated in HPI  BP 122/74 mmHg  Pulse 93  Resp 20  Ht 5\' 6"  (1.676 m)  Wt 163 lb (73.936 kg)  BMI 26.32 kg/m2  SpO2 98%  LMP 10/07/2014 (Approximate) General appearance: alert, cooperative and mild distress Lungs: clear to auscultation bilaterally Heart: regular rate and rhythm Abdomen: gravid, soft, non-tender Pelvic: adequate Extremities: Homans sign is negative, no sign of DVT, edema Presentation: cephalic Fetal monitoring: Baseline: 140 bpm, Variability: Good {> 6 bpm), Accelerations: Reactive and Decelerations: Absent Uterine activity: Frequency: Every 2-5 minutes Dilation: 10 Effacement (%): 80 Station: 0 Exam by:: Lamonte Sakai, RN   Prenatal labs: ABO, Rh: O/POS/-- (06/21 1125) Antibody: NEG (06/21 1125) Rubella:  Immune RPR: NON REAC (06/21 1125)  HBsAg: NEGATIVE (06/21 1125)  HIV: NONREACTIVE (06/21 1125)  GBS: Negative (08/04 0000)  1 hr Glucola 102 Genetic screening too late Anatomy US normal  Prenatal Transfer Tool  Maternal Diabetes: No Genetic Screening: Declined Maternal Ultrasounds/Referrals: Normal Fetal Ultrasounds or other Referrals:  None Maternal Substance Abuse:  Yes:  Type: Smoker, Cocaine, Prescription drugs, Methadone Significant Maternal Medications:  None Significant Maternal Lab  Results: Lab values include: Group B Strep negative  Results for orders placed or performed during the hospital encounter of 06/18/15 (from the past 24 hour(s))  CBC   Collection Time: 06/18/15  6:15 AM  Result Value Ref Range   WBC 18.9 (H) 4.0 - 10.5 K/uL   RBC 3.45 (L) 3.87 - 5.11 MIL/uL   Hemoglobin 10.4 (L) 12.0 - 15.0 g/dL   HCT 30.8 (L) 36.0 - 46.0 %   MCV 89.3 78.0 - 100.0 fL   MCH 30.1 26.0 - 34.0 pg   MCHC 33.8 30.0 - 36.0 g/dL   RDW 14.2 11.5 - 15.5 %   Platelets 306 150 -  400 K/uL    Patient Active Problem List   Diagnosis Date Noted  . Active labor 06/18/2015  . First trimester screening   . Substance abuse affecting pregnancy in third trimester, antepartum 04/23/2015  . Supervision of high-risk pregnancy with insufficient prenatal care in third trimester 04/14/2015  . Tobacco smoking affecting pregnancy in second trimester, antepartum 04/14/2015  . Methadone maintenance treatment affecting pregnancy, antepartum 04/14/2015  . History of preterm delivery, currently pregnant in second trimester 04/14/2015  . Pregnancy related nausea, antepartum 04/14/2015  . ADD (attention deficit disorder) 06/18/2014  . Backache 04/24/2013  . GERD without esophagitis 04/02/2013  . Chronic pain disorder 04/02/2013  . CTS (carpal tunnel syndrome) 06/06/2012  . Anxiety and depression 11/27/2011  . TOBACCO ABUSE 10/13/2010  . PANIC DISORDER 08/16/2010  . Migraine 08/16/2010  . Arthritis of spine 08/16/2010  . Closed fracture of cervical vertebra 08/16/2010    Assessment: [name] [name] is a 34 y.o. [id number] at [redacted]w[redacted]d here for active labor. Patient with history of cocaine use and methadone during pregnancy.   Admit to [facility] #Labor: Expectant management #Pain: Plan for epidural #ID: GBS neg #MOF: Breast but if unable bottle #MOC: BTL PP  CSW consult PP for substance use, late prenatal care, and depression  Luiz Blare, DO 06/18/2015, 8:16 AM PGY-2, [facility]  CNM attestation:  I have seen and examined this patient; I agree with above documentation in the resident's note.   [name] [name] is a 34 y.o. [id number] here for SOL/advanced labor  PE: BP 105/68 mmHg  Pulse 104  Resp 20  Ht 5\' 6"  (1.676 m)  Wt 73.936 kg (163 lb)  BMI 26.32 kg/m2  SpO2 98%  LMP 10/07/2014 (Approximate)  Resp: normal effort, no distress Abd: gravid  ROS, labs, PMH reviewed  Plan: Admit to [facility] Expectant management Anticipate SVD Plans epidural   [name] [name], [name] [name] 06/18/2015, 8:59 AM

## 2015-06-18 NOTE — Transfer of Care (Signed)
Immediate Anesthesia Transfer of Care Note  Patient: [name] [name]  Procedure(s) Performed: Procedure(s): POST PARTUM TUBAL LIGATION (N/A)  Patient Location: PACU  Anesthesia Type:Epidural  Level of Consciousness: awake, alert , oriented and patient cooperative  Airway & Oxygen Therapy: Patient Spontanous Breathing  Post-op Assessment: Report given to RN and Post -op Vital signs reviewed and stable  Post vital signs: Reviewed and stable  Last Vitals:  Filed Vitals:   06/18/15 1120  BP: 122/39  Pulse: 91  Temp: 36.8 C  Resp: 20    Complications: No apparent anesthesia complications

## 2015-06-18 NOTE — Op Note (Signed)
[name] [name] 06/18/2015  PREOPERATIVE DIAGNOSIS:  Undesired fertility  POSTOPERATIVE DIAGNOSIS:  Undesired fertility  PROCEDURE:  Postpartum Bilateral Tubal Sterilization using Filshie Clips   SURGEON:  Dr Loma Boston  ANESTHESIA:  Epidural  COMPLICATIONS:  None immediate.  ESTIMATED BLOOD LOSS:  Less than 20cc.  FLUIDS: 300 cc LR.  URINE OUTPUT:  400 cc of clear urine.  INDICATIONS: 34 y.o. yo [id number]  with undesired fertility,status post vaginal delivery, desires permanent sterilization. Risks and benefits of procedure discussed with patient including permanence of method, bleeding, infection, injury to surrounding organs and need for additional procedures. Risk failure of 0.5-1% with increased risk of ectopic gestation if pregnancy occurs was also discussed with patient.   FINDINGS:  Normal uterus, tubes, and ovaries.  TECHNIQUE:  The patient was taken to the operating room where her epidural anesthesia was dosed up to surgical level and found to be adequate.  She was then placed in the dorsal supine position and prepped and draped in sterile fashion.  After an adequate timeout was performed, attention was turned to the patient's abdomen where a small transverse skin incision was made under the umbilical fold. The incision was taken down to the layer of fascia using the scalpel, and fascia was incised, and extended bilaterally using Mayo scissors. The peritoneum was entered in a sharp fashion. Attention was then turned to the patient's uterus, and left fallopian tube was identified and followed out to the fimbriated end.  A Filshie clip was placed on the left fallopian tube about 2 cm from the cornual attachment, with care given to incorporate the underlying mesosalpinx.  A similar process was carried out on the rightl side allowing for bilateral tubal sterilization.  Good hemostasis was noted overall.  Local analgesia was drizzled on both operative sites.The instruments were then  removed from the patient's abdomen and the fascial incision was repaired with 0 Vicryl, and the skin was closed with a 3-0 Monocryl subcuticular stitch. The patient tolerated the procedure well.  Sponge, lap, and needle counts were correct times two.  The patient was then taken to the recovery room awake, extubated and in stable condition.   Truett Mainland, DO 06/18/2015 2:14 PM

## 2015-06-18 NOTE — Anesthesia Preprocedure Evaluation (Addendum)
Anesthesia Evaluation  Patient identified by MRN, date of birth, ID band Patient awake and Patient confused    Reviewed: Allergy & Precautions, H&P , NPO status , Patient's Chart, lab work & pertinent test results  Airway Mallampati: II       Dental   Pulmonary Current Smoker,  breath sounds clear to auscultation  Pulmonary exam normal       Cardiovascular Exercise Tolerance: Good Normal cardiovascular examRhythm:regular Rate:Normal     Neuro/Psych    GI/Hepatic   Endo/Other    Renal/GU      Musculoskeletal   Abdominal   Peds  Hematology   Anesthesia Other Findings   Reproductive/Obstetrics negative OB ROS (+) Pregnancy                            Anesthesia Physical Anesthesia Plan  ASA: II  Anesthesia Plan: Epidural   Post-op Pain Management:    Induction:   Airway Management Planned:   Additional Equipment:   Intra-op Plan:   Post-operative Plan:   Informed Consent: I have reviewed the patients History and Physical, chart, labs and discussed the procedure including the risks, benefits and alternatives for the proposed anesthesia with the patient or authorized representative who has indicated his/her understanding and acceptance.     Plan Discussed with:   Anesthesia Plan Comments:         Anesthesia Quick Evaluation

## 2015-06-18 NOTE — Progress Notes (Signed)
Patient ID: [name] [name], female   DOB: 1980/12/26, 34 y.o.   MRN: [id number]  Risks of procedure discussed with patient including but not limited to: risk of regret, permanence of method, bleeding, infection, injury to surrounding organs and need for additional procedures.  Failure risk of 1 -2 % with increased risk of ectopic gestation if pregnancy occurs was also discussed with patient.    Truett Mainland, DO 06/18/2015 10:41 AM

## 2015-06-18 NOTE — Anesthesia Postprocedure Evaluation (Signed)
  Anesthesia Post-op Note  Patient: [name] [name]  Procedure(s) Performed: Procedure(s) (LRB): POST PARTUM TUBAL LIGATION (N/A)  Patient Location: PACU  Anesthesia Type: Epidural  Level of Consciousness: awake and alert   Airway and Oxygen Therapy: Patient Spontanous Breathing  Post-op Pain: mild  Post-op Assessment: Post-op Vital signs reviewed, Patient's Cardiovascular Status Stable, Respiratory Function Stable, Patent Airway and No signs of Nausea or vomiting  Last Vitals:  Filed Vitals:   06/18/15 1543  BP:   Pulse: 82  Temp: 37.2 C  Resp: 16    Post-op Vital Signs: stable   Complications: No apparent anesthesia complications

## 2015-06-19 ENCOUNTER — Encounter (HOSPITAL_COMMUNITY): Payer: Self-pay | Admitting: Family Medicine

## 2015-06-19 LAB — RPR: RPR: NONREACTIVE

## 2015-06-19 MED ORDER — IBUPROFEN 600 MG PO TABS
600.0000 mg | ORAL_TABLET | Freq: Four times a day (QID) | ORAL | Status: DC
Start: 2015-06-19 — End: 2018-03-24

## 2015-06-19 NOTE — Anesthesia Postprocedure Evaluation (Signed)
Anesthesia Post Note  Patient: [name] [name]  Procedure(s) Performed: * No procedures listed *  Anesthesia type: Epidural  Patient location: Mother/Baby  Post pain: Pain level controlled  Post assessment: Post-op Vital signs reviewed  Last Vitals:  Filed Vitals:   06/19/15 0617  BP: 113/64  Pulse: 66  Temp: 36.7 C  Resp: 16    Post vital signs: Reviewed  Level of consciousness: awake  Complications: No apparent anesthesia complications

## 2015-06-19 NOTE — Progress Notes (Signed)
CSW received call from [facility] requesting CSW to return to MOB's room.  CSW arrived to MOB's room while they were discussing plan of care with Dr. Francina Ames.  MOB is again upset with the fact that baby's meconium is being screened and states, "I don't have time for an open CPS case."  CSW attempted to inform MOB and MGM that CPS is not involved at this time, and again commend MOB for her decisions to remove herself for the negative environment/seek substance use treatment, but found it impossible to speak due to interruptions by MOB.  CSW continues to be concerned about baby's safety in the room due to MOB's labile presentation and asked that staff closely monitor when she is in room alone.

## 2015-06-19 NOTE — Progress Notes (Signed)
While rounding on pt, pt stated "I'm going to go outside to smoke and leave baby in the room". Policy and safety explained to pt. Pt then became very distraught and angry then starting yelling, cursing, and cornered me in room. Pt requested a new nurse. Charge nurse notified, new nurse assigned. House supervisor RN notified. Security notified. Pediatrician notified per pt request.

## 2015-06-19 NOTE — Progress Notes (Signed)
UR chart review completed.  

## 2015-06-19 NOTE — Addendum Note (Signed)
Addendum  created 06/19/15 [id number] by Talbot Grumbling, CRNA   Modules edited: Notes Section   Notes Section:  File: [id number]

## 2015-06-19 NOTE — Lactation Note (Signed)
This note was copied from the chart of [place]. Lactation Consultation Note: Experienced BF mom. On Methadone. Reports baby has been latching well but she is not making enough milk for him and she is having to supplement with formula. Requests DEBP to help increase milk supply. DEBP set up for mom with instructions for use and cleaning of pump pieces. Eating lunch at present. Reports baby last fed about 1 hour ago. Will pump after next feeding. LS 9-10 by RN. BF brochure given with resources for support after DC. No questions at present. To call for assist prn  Patient Name: [name] Date: 06/19/2015 Reason for consult: Initial assessment   Maternal Data Formula Feeding for Exclusion: Yes Reason for exclusion: Mother's choice to formula and breast feed on admission;Substance abuse and/or alcohol abuse Does the patient have breastfeeding experience prior to this delivery?: Yes  Feeding    LATCH Score/Interventions                      Lactation Tools Discussed/Used Pump Review: Setup, frequency, and cleaning;Milk Storage Initiated by:: DW Date initiated:: 06/19/15   Consult Status Consult Status: PRN    Truddie Crumble 06/19/2015, 2:08 PM

## 2015-06-19 NOTE — Anesthesia Postprocedure Evaluation (Signed)
Anesthesia Post Note  Patient: [name] [name]  Procedure(s) Performed: Procedure(s) (LRB): POST PARTUM TUBAL LIGATION (N/A)  Anesthesia type: Epidural  Patient location: Mother/Baby  Post pain: Pain level controlled  Post assessment: Post-op Vital signs reviewed  Last Vitals:  Filed Vitals:   06/19/15 0617  BP: 113/64  Pulse: 66  Temp: 36.7 C  Resp: 16    Post vital signs: Reviewed  Level of consciousness: awake  Complications: No apparent anesthesia complications
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
## 2015-06-19 NOTE — Discharge Instructions (Signed)

## 2015-06-21 NOTE — Discharge Summary (Signed)
Obstetric Discharge Summary Reason for Admission: onset of labor Prenatal Procedures: NST Intrapartum Procedures: spontaneous vaginal delivery Postpartum Procedures: P.P. tubal ligation Complications-Operative and Postpartum: none  Patient is 34 y.o. [id number] [redacted]w[redacted]d admitted for active labor, hx of substance use in pregnancy and on methadone. [place] h/o tobacco use during pregnacny, depression and anxiety disorder, and late [facility].  Delivery Note At 7:57 AM a viable female was delivered via Vaginal, Spontaneous Delivery (Presentation: Left Occiput Anterior).  APGAR: 8, 9; weight pending.   Placenta status: Intact, Spontaneous.  Cord: 3 vessels with the following complications: None.    Anesthesia: Epidural  Episiotomy: None Lacerations:  None Est. Blood Loss (mL):  200  Mom to postpartum.  Baby to Couplet care / Skin to Skin.  Luiz Blare, DO 06/18/2015, 8:22 AM PGY-2, Combine Hospital Course: Active Problems:   TOBACCO ABUSE   Anxiety and depression   Chronic pain disorder   ADD (attention deficit disorder)   Supervision of high-risk pregnancy with insufficient prenatal care in third trimester   Methadone maintenance treatment affecting pregnancy, antepartum   History of preterm delivery, currently pregnant in second trimester   Substance abuse affecting pregnancy in third trimester, antepartum   Active labor   Jaidin L Hepp is a 34 y.o. [id number] s/p NSVD.  Patient was admitted in active labor.  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 Mother was also oppositional to staff and would get frustrated easily. . On the day of discharge the patient was told that her infant needed to have an adult in with them at all times when the patient wanted got outside to smoke.  I had a length discussion with the patient and her bedside RN about the options as the infant would have to stay 5 days per hospital  policy regarding methadone exposed infants. A joint decision was made with the patient and her mother that the patient would be discharged today in order to attend her methadone clinic appointment Saturday. The patient feels this would be the best for her her and feels ready to go home and  will be discharged with outpatient follow-up.   Today: No acute events overnight.  Pt denies problems with ambulating, voiding or po intake.  She denies nausea or vomiting.  Pain is moderately controlled.  She has had flatus. She has not had bowel movement.  Lochia Minimal.  Plan for birth control is  bilateral tubal ligation.  Method of Feeding: Breast  Physical Exam:  General: alert and uncooperative, labile Lochia: appropriate Uterine Fundus: firm Incision: healing well DVT Evaluation: No evidence of DVT seen on physical exam.  H/H: Lab Results  Component Value Date/Time   HGB 10.4* 06/18/2015 06:15 AM   HCT 30.8* 06/18/2015 06:15 AM    Discharge Diagnoses: Term Pregnancy-delivered  Discharge Information: Date: 06/21/2015 Activity: pelvic rest Diet: routine  Medications: Ibuprofen and Colace Breast feeding:  Yes Condition: stable Instructions: refer to handout Discharge to: home       Discharge Instructions    Call MD for:  persistant dizziness or light-headedness    Complete by:  As directed      Call MD for:  persistant nausea and vomiting    Complete by:  As directed      Call MD for:  severe uncontrolled pain    Complete by:  As directed      Call MD for:  temperature >100.4  Complete by:  As directed      Diet general    Complete by:  As directed      Sexual acrtivity    Complete by:  As directed   Avoid anything in the vagina  for 6 weeks. If you do have sex please use condoms as your can become pregnant.            Medication List    STOP taking these medications        CITRANATAL ASSURE 35-1 & 300 MG tablet      TAKE these medications        cyclobenzaprine  5 MG tablet  Commonly known as:  FLEXERIL  TAKE 1 TABLET BY MOUTH 3 TIMES A DAY AS NEEDED FOR MUSCLE SPASMS     ibuprofen 600 MG tablet  Commonly known as:  ADVIL,MOTRIN  Take 1 tablet (600 mg total) by mouth every 6 (six) hours.     methadone 10 MG/ML solution  Commonly known as:  DOLOPHINE  Take 120 mg by mouth daily.     pantoprazole 40 MG tablet  Commonly known as:  PROTONIX  Take 1 tablet (40 mg total) by mouth daily.     promethazine 12.5 MG tablet  Commonly known as:  PHENERGAN  Take 1 tablet (12.5 mg total) by mouth every 6 (six) hours as needed for nausea or vomiting.       Follow-up Information    Follow up with Home In 5 weeks.   Contact information:   [address] [id number] [place]     Caren Macadam ,MD OB Fellow 06/21/2015,4:21 AM

## 2015-07-22 ENCOUNTER — Encounter: Payer: Self-pay | Admitting: *Deleted

## 2015-07-24 ENCOUNTER — Ambulatory Visit: Payer: Medicaid Other | Admitting: Obstetrics & Gynecology

## 2015-07-29 NOTE — Addendum Note (Signed)
Addendum  created 07/29/15 0732 by Lyndle Herrlich, MD   Modules edited: Anesthesia Responsible Staff

## 2015-07-30 NOTE — Addendum Note (Signed)
Addendum  created 07/30/15 1103 by Josephine Igo, MD   Modules edited: Anesthesia LDA, Lines/Drains/Airways Properties Editor   Lines/Drains/Airways Properties Editor:  Properties of line/drain/airway/wound Epidural Catheter 06/18/15 have been modified.

## 2015-09-22 ENCOUNTER — Telehealth: Payer: Self-pay | Admitting: Family Medicine

## 2015-09-22 NOTE — Telephone Encounter (Signed)
Called patient to schedule Flu Shot on 11/29. Patient declined.

## 2015-09-22 NOTE — Telephone Encounter (Signed)
documented

## 2015-12-15 ENCOUNTER — Encounter: Payer: Self-pay | Admitting: *Deleted

## 2016-03-05 ENCOUNTER — Encounter (HOSPITAL_BASED_OUTPATIENT_CLINIC_OR_DEPARTMENT_OTHER): Payer: Self-pay | Admitting: *Deleted

## 2016-03-05 ENCOUNTER — Emergency Department (HOSPITAL_BASED_OUTPATIENT_CLINIC_OR_DEPARTMENT_OTHER)
Admission: EM | Admit: 2016-03-05 | Discharge: 2016-03-05 | Disposition: A | Discharge status: Home / Self Care | Payer: Medicaid Other | Attending: Emergency Medicine | Admitting: Emergency Medicine

## 2016-03-05 DIAGNOSIS — F419 Anxiety disorder, unspecified: Secondary | ICD-10-CM | POA: Diagnosis not present

## 2016-03-05 DIAGNOSIS — F329 Major depressive disorder, single episode, unspecified: Secondary | ICD-10-CM | POA: Insufficient documentation

## 2016-03-05 DIAGNOSIS — R0789 Other chest pain: Secondary | ICD-10-CM

## 2016-03-05 DIAGNOSIS — M25461 Effusion, right knee: Secondary | ICD-10-CM | POA: Insufficient documentation

## 2016-03-05 DIAGNOSIS — F1911 Other psychoactive substance abuse, in remission: Secondary | ICD-10-CM

## 2016-03-05 DIAGNOSIS — Z791 Long term (current) use of non-steroidal anti-inflammatories (NSAID): Secondary | ICD-10-CM | POA: Insufficient documentation

## 2016-03-05 DIAGNOSIS — M4690 Unspecified inflammatory spondylopathy, site unspecified: Secondary | ICD-10-CM | POA: Insufficient documentation

## 2016-03-05 DIAGNOSIS — F17210 Nicotine dependence, cigarettes, uncomplicated: Secondary | ICD-10-CM | POA: Diagnosis not present

## 2016-03-05 DIAGNOSIS — M25469 Effusion, unspecified knee: Secondary | ICD-10-CM

## 2016-03-05 DIAGNOSIS — M25462 Effusion, left knee: Secondary | ICD-10-CM | POA: Diagnosis not present

## 2016-03-05 NOTE — ED Provider Notes (Signed)
CSN: [id number]     Arrival date & time 03/05/16  1434 History  By signing my name below, I, Irene Pap, attest that this documentation has been prepared under the direction and in the presence of Charlesetta Shanks, MD. Electronically Signed: Irene Pap, ED Scribe. 03/05/2016. 3:38 PM.   Chief Complaint  Patient presents with  . Joint Swelling  . Chest Pain   The history is provided by the patient. No language interpreter was used.  HPI Comments: [name] [name] is a 35 y.o. female with a hx of seizures, anxiety, and depression who presents to the Emergency Department complaining of intermittent generalized, tight chest pain onset one week ago. She states that the pain feels like a minor panic attack. Pt reports that she has been having intermittent swelling to bilateral knees for the past 4 months. She states that the swelling occurs every night and is relieved by morning. She reports that she is on methadone and would like an EKG to bring to her doctor at the methadone clinic to adjust her dosages. She states that she began the methadone one year ago, gained 30 lbs, and began to notice her knee symptoms after. Pt reports past hx of knee injury 4 years ago. She denies hx of knee surgeries, fever, chills, nausea, diaphoresis, or rash. Pt does not have a PCP.   Past Medical History  Diagnosis Date  . Mental disorder   . Headache(784.0)   . Preterm labor   . Fracture of spinal vertebra 2009    Surgery in 2011  . Hemorrhoids thrombosed   . Anxiety   . Postpartum depression 11/27/2011  . PHARYNGITIS 10/13/2010    Qualifier: Diagnosis of  By: Charlett Blake MD, Erline Levine    . Acute bronchitis 10/13/2010    Qualifier: Diagnosis of  By: Charlett Blake MD, Erline Levine    . Anxiety and depression 11/27/2011  . Nausea & vomiting 07/10/2012  . Arthritis of spine 08/16/2010    Qualifier: Diagnosis of  By: Charlett Blake MD, Erline Levine    . Insomnia 07/13/2013  . ADD (attention deficit disorder) 06/18/2014  . Seizures ([place])     Last  seizure 2005   Past Surgical History  Procedure Laterality Date  . Cervical fusion  05/26/2011    She thinks it was C2  . Wisdom tooth extraction    . Tubal ligation N/A 06/18/2015    Procedure: POST PARTUM TUBAL LIGATION;  Surgeon: Truett Mainland, DO;  Location: [facility];  Service: Gynecology;  Laterality: N/A;   Family History  Problem Relation Age of Onset  . Arthritis Mother   . Hypertension Mother   . Coronary artery disease Father     s/p MIs/ smoker  . Alcohol abuse Father   . Heart attack Father   . Endometriosis Sister   . Aplastic anemia Maternal Grandmother   . Arthritis Maternal Grandmother     s/p hip replacement for arthritis  . Anemia Maternal Grandmother     aplastic  . COPD Maternal Grandmother   . Heart disease Maternal Grandmother   . Scoliosis Maternal Grandfather     s/p surgeries  . Endometriosis Sister   . Cancer Sister     cervical  . Hypertension Sister   . Otitis media Daughter    Social History  Substance Use Topics  . Smoking status: Current Every Day Smoker -- 0.50 packs/day for 17 years    Types: Cigarettes  . Smokeless tobacco: Never Used  . Alcohol Use: No   OB  History    Gravida Para Term Preterm AB TAB SAB Ectopic Multiple Living   5 5 4 1      0 5     Review of Systems 10 Systems reviewed and all are negative for acute change except as noted in the HPI.  Allergies  Cephalexin  Home Medications   Prior to Admission medications   Medication Sig Start Date End Date Taking? Authorizing Provider  ibuprofen (ADVIL,MOTRIN) 600 MG tablet Take 1 tablet (600 mg total) by mouth every 6 (six) hours. 06/19/15  Yes Caren Macadam, MD  cyclobenzaprine (FLEXERIL) 5 MG tablet TAKE 1 TABLET BY MOUTH 3 TIMES A DAY AS NEEDED FOR MUSCLE SPASMS 03/28/15   Historical Provider, MD  methadone (DOLOPHINE) 10 MG/ML solution Take 150 mg by mouth daily.     Historical Provider, MD  pantoprazole (PROTONIX) 40 MG tablet Take 1 tablet (40 mg total) by  mouth daily. 06/11/15   Truett Mainland, DO  promethazine (PHENERGAN) 12.5 MG tablet Take 1 tablet (12.5 mg total) by mouth every 6 (six) hours as needed for nausea or vomiting. 06/11/15   Tanna Savoy Stinson, DO   BP 112/84 mmHg  Pulse 65  Temp(Src) 98.2 F (36.8 C) (Oral)  Resp 14  Ht 5\' 5"  (1.651 m)  Wt 160 lb (72.576 kg)  BMI 26.63 kg/m2  SpO2 99%  LMP 03/05/2016  Breastfeeding? No Physical Exam  Constitutional: She is oriented to person, place, and time. She appears well-developed and well-nourished. No distress.  HENT:  Head: Normocephalic and atraumatic.  Eyes: Conjunctivae and EOM are normal. Pupils are equal, round, and reactive to light.  Neck: Trachea normal and normal range of motion. Neck supple. No thyromegaly present.  Cardiovascular: Normal rate, regular rhythm, normal heart sounds and intact distal pulses.  Exam reveals no gallop and no friction rub.   No murmur heard. Pulmonary/Chest: Effort normal and breath sounds normal. No respiratory distress. She has no wheezes.  Abdominal: Soft. There is no tenderness.  Musculoskeletal: Normal range of motion. She exhibits no edema.       Right knee: She exhibits effusion. She exhibits no erythema.       Left knee: She exhibits effusion. She exhibits no erythema.  Upper extremities: no swelling, no effusion; joint tissues normal Lower extremities: mild effusion to bilateral knees with no erythema; calves soft and non-tender; bilateral DP pulses symmetric; no bilateral lower extremity edema; skin intact  Lymphadenopathy:    She has no cervical adenopathy.  Neurological: She is alert and oriented to person, place, and time. She has normal reflexes.  Skin: Skin is warm and dry.  Psychiatric: She has a normal mood and affect. Her behavior is normal.  Nursing note and vitals reviewed.   ED Course  Procedures (including critical care time) DIAGNOSTIC STUDIES: Oxygen Saturation is 100% on RA, normal by my interpretation.     COORDINATION OF CARE: 3:37 PM-Discussed treatment plan which includes EKG with pt at bedside and pt agreed to plan.    Labs Review Labs Reviewed - No data to display  Imaging Review No results found. I have personally reviewed and evaluated these images and lab results as part of my medical decision-making.   EKG Interpretation   Date/Time:  Saturday Mar 05 2016 16:03:53 EDT Ventricular Rate:  64 PR Interval:  205 QRS Duration: 80 QT Interval:  445 QTC Calculation: 459 R Axis:   47 Text Interpretation:  Sinus rhythm Borderline prolonged PR interval  normal. no change  from previous Confirmed by Johnney Killian, MD, Jeannie Done [phone])  on 03/05/2016 4:17:52 PM      MDM   Final diagnoses:  Other chest pain  Anxiety  History of drug abuse  Knee effusion, unspecified laterality   Patient's chest pains as well as low probability for ischemic cardiac etiology or serious pulmonary etiology. She experiences diffuse tightness lasts for a few minutes at a time that is associated with anxiety. She has not had associated symptoms. She reports needing an EKG to provide to her methadone provider. EKG shows no change from prior EKGs. No ischemic appearance otherwise normal. Patient also reports 4 months of lateral knee swelling that occurs at night and resolves in the morning. She does have appreciable small effusions bilaterally. She however has no erythema and no tenderness with range of motion. Other joints are normal and she is not experiencing any small joint issues, rashes, fevers. Patient is counseled necessity for follow-up with a family provider to continue monitoring her response both to her methadone as well as other complaints such as weight gain with methadone and nonacute bilateral knee effusions. Patient is well appearance and has stable vital signs.   Charlesetta Shanks, MD 03/05/16 [phone]

## 2016-03-05 NOTE — ED Notes (Signed)
Pt reports intermittent chest pain x 1 week. Denies pain at present. Also reports swelling in both knees for several months. Pt is on methadone

## 2016-03-05 NOTE — Discharge Instructions (Signed)
Nonspecific Chest Pain  You need to have a family doctor to coordinate your care and further evaluate your symptoms. There is a referral number you may call or you may set up an appointment with [facility]. Chest pain can be caused by many different conditions. There is always a chance that your pain could be related to something serious, such as a heart attack or a blood clot in your lungs. Chest pain can also be caused by conditions that are not life-threatening. If you have chest pain, it is very important to follow up with your health care provider. CAUSES  Chest pain can be caused by:  Heartburn.  Pneumonia or bronchitis.  Anxiety or stress.  Inflammation around your heart (pericarditis) or lung (pleuritis or pleurisy).  A blood clot in your lung.  A collapsed lung (pneumothorax). It can develop suddenly on its own (spontaneous pneumothorax) or from trauma to the chest.  Shingles infection (varicella-zoster virus).  Heart attack.  Damage to the bones, muscles, and cartilage that make up your chest wall. This can include:  Bruised bones due to injury.  Strained muscles or cartilage due to frequent or repeated coughing or overwork.  Fracture to one or more ribs.  Sore cartilage due to inflammation (costochondritis). RISK FACTORS  Risk factors for chest pain may include:  Activities that increase your risk for trauma or injury to your chest.  Respiratory infections or conditions that cause frequent coughing.  Medical conditions or overeating that can cause heartburn.  Heart disease or family history of heart disease.  Conditions or health behaviors that increase your risk of developing a blood clot.  Having had chicken pox (varicella zoster). SIGNS AND SYMPTOMS Chest pain can feel like:  Burning or tingling on the surface of your chest or deep in your chest.  Crushing, pressure, aching, or squeezing pain.  Dull or sharp pain that is worse  when you move, cough, or take a deep breath.  Pain that is also felt in your back, neck, shoulder, or arm, or pain that spreads to any of these areas. Your chest pain may come and go, or it may stay constant. DIAGNOSIS Lab tests or other studies may be needed to find the cause of your pain. Your health care provider may have you take a test called an ambulatory ECG (electrocardiogram). An ECG records your heartbeat patterns at the time the test is performed. You may also have other tests, such as:  Transthoracic echocardiogram (TTE). During echocardiography, sound waves are used to create a picture of all of the heart structures and to look at how blood flows through your heart.  Transesophageal echocardiogram (TEE).This is a more advanced imaging test that obtains images from inside your body. It allows your health care provider to see your heart in finer detail.  Cardiac monitoring. This allows your health care provider to monitor your heart rate and rhythm in real time.  Holter monitor. This is a portable device that records your heartbeat and can help to diagnose abnormal heartbeats. It allows your health care provider to track your heart activity for several days, if needed.  Stress tests. These can be done through exercise or by taking medicine that makes your heart beat more quickly.  Blood tests.  Imaging tests. TREATMENT  Your treatment depends on what is causing your chest pain. Treatment may include:  Medicines. These may include:  Acid blockers for heartburn.  Anti-inflammatory medicine.  Pain medicine for inflammatory conditions.  Antibiotic  medicine, if an infection is present.  Medicines to dissolve blood clots.  Medicines to treat coronary artery disease.  Supportive care for conditions that do not require medicines. This may include:  Resting.  Applying heat or cold packs to injured areas.  Limiting activities until pain decreases. HOME CARE  INSTRUCTIONS  If you were prescribed an antibiotic medicine, finish it all even if you start to feel better.  Avoid any activities that bring on chest pain.  Do not use any tobacco products, including cigarettes, chewing tobacco, or electronic cigarettes. If you need help quitting, ask your health care provider.  Do not drink alcohol.  Take medicines only as directed by your health care provider.  Keep all follow-up visits as directed by your health care provider. This is important. This includes any further testing if your chest pain does not go away.  If heartburn is the cause for your chest pain, you may be told to keep your head raised (elevated) while sleeping. This reduces the chance that acid will go from your stomach into your esophagus.  Make lifestyle changes as directed by your health care provider. These may include:  Getting regular exercise. Ask your health care provider to suggest some activities that are safe for you.  Eating a heart-healthy diet. A registered dietitian can help you to learn healthy eating options.  Maintaining a healthy weight.  Managing diabetes, if necessary.  Reducing stress. SEEK MEDICAL CARE IF:  Your chest pain does not go away after treatment.  You have a rash with blisters on your chest.  You have a fever. SEEK IMMEDIATE MEDICAL CARE IF:   Your chest pain is worse.  You have an increasing cough, or you cough up blood.  You have severe abdominal pain.  You have severe weakness.  You faint.  You have chills.  You have sudden, unexplained chest discomfort.  You have sudden, unexplained discomfort in your arms, back, neck, or jaw.  You have shortness of breath at any time.  You suddenly start to sweat, or your skin gets clammy.  You feel nauseous or you vomit.  You suddenly feel light-headed or dizzy.  Your heart begins to beat quickly, or it feels like it is skipping beats. These symptoms may represent a serious  problem that is an emergency. Do not wait to see if the symptoms will go away. Get medical help right away. Call your local emergency services (911 in the [place] Do not drive yourself to the hospital.   This information is not intended to replace advice given to you by your health care provider. Make sure you discuss any questions you have with your health care provider.   Document Released: 07/20/2005 Document Revised: 10/31/2014 Document Reviewed: 05/16/2014 Elsevier Interactive Patient Education 2016 Else[employer] Knee Effusion Knee effusion means that you have excess fluid in your knee joint. This can cause pain and swelling in your knee. This may make your knee more difficult to bend and move. That is because there is increased pain and pressure in the joint. If there is fluid in your knee, it often means that something is wrong inside your knee, such as severe arthritis, abnormal inflammation, or an infection. Another common cause of knee effusion is an injury to the knee muscles, ligaments, or cartilage. HOME CARE INSTRUCTIONS  Use crutches as directed by your health care provider.  Wear a knee brace as directed by your health care provider.  Apply ice to the swollen area:  Put ice  in a plastic bag.  Place a towel between your skin and the bag.  Leave the ice on for 20 minutes, 2-3 times per day.  Keep your knee raised (elevated) when you are sitting or lying down.  Take medicines only as directed by your health care provider.  Do any rehabilitation or strengthening exercises as directed by your health care provider.  Rest your knee as directed by your health care provider. You may start doing your normal activities again when your health care provider approves.   Keep all follow-up visits as directed by your health care provider. This is important. SEEK MEDICAL CARE IF:  You have ongoing (persistent) pain in your knee. SEEK IMMEDIATE MEDICAL CARE IF:  You have increased  swelling or redness of your knee.  You have severe pain in your knee.  You have a fever.   This information is not intended to replace advice given to you by your health care provider. Make sure you discuss any questions you have with your health care provider.   Document Released: 12/31/2003 Document Revised: 10/31/2014 Document Reviewed: 05/26/2014 Elsevier Interactive Patient Education [employer].

## 2016-11-07 ENCOUNTER — Emergency Department (HOSPITAL_COMMUNITY)
Admission: EM | Admit: 2016-11-07 | Discharge: 2016-11-07 | Disposition: A | Discharge status: Home / Self Care | Payer: Medicaid Other | Attending: Emergency Medicine | Admitting: Emergency Medicine

## 2016-11-07 ENCOUNTER — Encounter (HOSPITAL_COMMUNITY): Payer: Self-pay | Admitting: Emergency Medicine

## 2016-11-07 DIAGNOSIS — F1110 Opioid abuse, uncomplicated: Secondary | ICD-10-CM | POA: Insufficient documentation

## 2016-11-07 DIAGNOSIS — F17210 Nicotine dependence, cigarettes, uncomplicated: Secondary | ICD-10-CM | POA: Insufficient documentation

## 2016-11-07 DIAGNOSIS — F1910 Other psychoactive substance abuse, uncomplicated: Secondary | ICD-10-CM

## 2016-11-07 DIAGNOSIS — F909 Attention-deficit hyperactivity disorder, unspecified type: Secondary | ICD-10-CM | POA: Insufficient documentation

## 2016-11-07 DIAGNOSIS — R002 Palpitations: Secondary | ICD-10-CM | POA: Diagnosis not present
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
## 2016-11-07 NOTE — ED Provider Notes (Addendum)
[facility] DEPT Provider Note   CSN: [id number] Arrival date & time: 11/07/16  2139     History   Chief Complaint Chief Complaint  Patient presents with  . Palpitations    HPI [name] [name] is a 36 y.o. female.  HPI Patient reports that she made a mistake today. She went to see her baby-daddy about getting some money for the child's upcoming birthday. Unfortunately he brought out a line of drugs which she thinks was heroine. She snorted it. She reports after that she felt really anxious and started breathing fast. She reports her heart was racing. She's not sure she went on to have a panic attack or if it was something in the drugs. Patient reports that she has been clean from drugs for 2 years and is been consistent with her methadone clinic. She reports this is the first illegal drugs she's used in 2 years. She did call the ambulance because she was so frightened. She reports now she actually feels a lot better than on the symptoms seem to have gone away. Past Medical History:  Diagnosis Date  . Acute bronchitis 10/13/2010   Qualifier: Diagnosis of  By: Charlett Blake MD, Erline Levine    . ADD (attention deficit disorder) 06/18/2014  . Anxiety   . Anxiety and depression 11/27/2011  . Arthritis of spine ([place]) 08/16/2010   Qualifier: Diagnosis of  By: Charlett Blake MD, Erline Levine    . Fracture of spinal vertebra 2009   Surgery in 2011  . Headache(784.0)   . Hemorrhoids thrombosed   . Insomnia 07/13/2013  . Mental disorder   . Nausea & vomiting 07/10/2012  . PHARYNGITIS 10/13/2010   Qualifier: Diagnosis of  By: Charlett Blake MD, Erline Levine    . Postpartum depression 11/27/2011  . Preterm labor   . Seizures ([place])    Last seizure 2005    Patient Active Problem List   Diagnosis Date Noted  . NSVD (normal spontaneous vaginal delivery) 06/21/2015  . Active labor 06/18/2015  . Substance abuse affecting pregnancy in third trimester, antepartum 04/23/2015  . Supervision of high-risk pregnancy with insufficient  prenatal care in third trimester 04/14/2015  . Tobacco smoking affecting pregnancy in second trimester, antepartum 04/14/2015  . Methadone maintenance treatment affecting pregnancy, antepartum ([place]) 04/14/2015  . History of preterm delivery, currently pregnant in second trimester 04/14/2015  . Pregnancy related nausea, antepartum 04/14/2015  . ADD (attention deficit disorder) 06/18/2014  . Backache 04/24/2013  . GERD without esophagitis 04/02/2013  . Chronic pain disorder 04/02/2013  . CTS (carpal tunnel syndrome) 06/06/2012  . Anxiety and depression 11/27/2011  . TOBACCO ABUSE 10/13/2010  . PANIC DISORDER 08/16/2010  . Migraine 08/16/2010  . Arthritis of spine ([place]) 08/16/2010  . Closed fracture of cervical vertebra ([place]) 08/16/2010    Past Surgical History:  Procedure Laterality Date  . CERVICAL FUSION  05/26/2011   She thinks it was C2  . TUBAL LIGATION N/A 06/18/2015   Procedure: POST PARTUM TUBAL LIGATION;  Surgeon: Truett Mainland, DO;  Location: [facility];  Service: Gynecology;  Laterality: N/A;  . WISDOM TOOTH EXTRACTION      OB History    Gravida Para Term Preterm AB Living   5 5 4 1   5    SAB TAB Ectopic Multiple Live Births         0 5       Home Medications    Prior to Admission medications   Medication Sig Start Date End Date Taking? Authorizing Provider  docusate  sodium (COLACE) 100 MG capsule Take 100 mg by mouth every other day.   Yes Historical Provider, MD  doxylamine, Sleep, (UNISOM) 25 MG tablet Take 50 mg by mouth at bedtime.   Yes Historical Provider, MD  methadone (DOLOPHINE) 10 MG/ML solution Take 140 mg by mouth daily.    Yes Historical Provider, MD  Probiotic Product (PROBIOTIC PO) Take 1 tablet by mouth daily.   Yes Historical Provider, MD  ibuprofen (ADVIL,MOTRIN) 600 MG tablet Take 1 tablet (600 mg total) by mouth every 6 (six) hours. Patient not taking: Reported on 11/07/2016 06/19/15   Caren Macadam, MD  pantoprazole (PROTONIX) 40 MG tablet  Take 1 tablet (40 mg total) by mouth daily. Patient not taking: Reported on 11/07/2016 06/11/15   Tanna Savoy Stinson, DO  promethazine (PHENERGAN) 12.5 MG tablet Take 1 tablet (12.5 mg total) by mouth every 6 (six) hours as needed for nausea or vomiting. Patient not taking: Reported on 11/07/2016 06/11/15   Truett Mainland, DO    Family History Family History  Problem Relation Age of Onset  . Arthritis Mother   . Hypertension Mother   . Coronary artery disease Father     s/p MIs/ smoker  . Alcohol abuse Father   . Heart attack Father   . Endometriosis Sister   . Aplastic anemia Maternal Grandmother   . Arthritis Maternal Grandmother     s/p hip replacement for arthritis  . Anemia Maternal Grandmother     aplastic  . COPD Maternal Grandmother   . Heart disease Maternal Grandmother   . Scoliosis Maternal Grandfather     s/p surgeries  . Endometriosis Sister   . Cancer Sister     cervical  . Hypertension Sister   . Otitis media Daughter     Social History Social History  Substance Use Topics  . Smoking status: Current Every Day Smoker    Packs/day: 0.50    Years: 17.00    Types: Cigarettes  . Smokeless tobacco: Never Used  . Alcohol use No     Allergies   Cephalexin   Review of Systems Review of Systems 10 Systems reviewed and are negative for acute change except as noted in the HPI.   Physical Exam Updated Vital Signs BP 137/77 (BP Location: Right Arm)   Pulse 94   Temp 98.3 F (36.8 C) (Oral)   Resp 16   SpO2 100% Comment: Simultaneous filing. User may not have seen previous data.  Physical Exam  Constitutional: She is oriented to person, place, and time. She appears well-developed and well-nourished. No distress.  HENT:  Head: Normocephalic and atraumatic.  Eyes: Conjunctivae are normal.  Neck: Neck supple.  Cardiovascular: Normal rate and regular rhythm.   No murmur heard. Pulmonary/Chest: Effort normal and breath sounds normal. No respiratory distress.   Abdominal: Soft. There is no tenderness.  Musculoskeletal: She exhibits no edema.  Neurological: She is alert and oriented to person, place, and time. No cranial nerve deficit. She exhibits normal muscle tone. Coordination normal.  Skin: Skin is warm and dry.  Psychiatric: She has a normal mood and affect.  Nursing note and vitals reviewed.    ED Treatments / Results  Labs (all labs ordered are listed, but only abnormal results are displayed) Labs Reviewed - No data to display  EKG Sinus rhythm 81 PR 184 QTC 482 No acute ischemic changes. No change compared to prior EKG.  Radiology No results found.  Procedures Procedures (including critical care time)  Medications Ordered in ED Medications - No data to display   Initial Impression / Assessment and Plan / ED Course  I have reviewed the triage vital signs and the nursing notes.  Pertinent labs & imaging results that were available during my care of the patient were reviewed by me and considered in my medical decision making (see chart for details).  Clinical Course     Final Clinical Impressions(s) / ED Diagnoses   Final diagnoses:  Palpitations  Substance abuse   Patient is alert and well in appearance at this time. Her vital signs are stable. Physical examination is normal. Patient's symptoms have resolved. At this time I do not see indication for further diagnostic evaluation. New Prescriptions New Prescriptions   No medications on file     Charlesetta Shanks, MD 11/07/16 [place], MD 11/07/16 [phone]

## 2016-12-04 ENCOUNTER — Encounter (HOSPITAL_BASED_OUTPATIENT_CLINIC_OR_DEPARTMENT_OTHER): Payer: Self-pay | Admitting: *Deleted

## 2016-12-04 ENCOUNTER — Emergency Department (HOSPITAL_BASED_OUTPATIENT_CLINIC_OR_DEPARTMENT_OTHER): Payer: Medicaid Other

## 2016-12-04 ENCOUNTER — Emergency Department (HOSPITAL_BASED_OUTPATIENT_CLINIC_OR_DEPARTMENT_OTHER)
Admission: EM | Admit: 2016-12-04 | Discharge: 2016-12-04 | Disposition: A | Discharge status: Home / Self Care | Payer: Medicaid Other | Attending: Emergency Medicine | Admitting: Emergency Medicine

## 2016-12-04 DIAGNOSIS — F17210 Nicotine dependence, cigarettes, uncomplicated: Secondary | ICD-10-CM | POA: Insufficient documentation

## 2016-12-04 DIAGNOSIS — R002 Palpitations: Secondary | ICD-10-CM | POA: Diagnosis present

## 2016-12-04 DIAGNOSIS — Z79899 Other long term (current) drug therapy: Secondary | ICD-10-CM | POA: Diagnosis not present

## 2016-12-04 LAB — PREGNANCY, URINE: Preg Test, Ur: NEGATIVE

## 2016-12-04 LAB — COMPREHENSIVE METABOLIC PANEL
ALK PHOS: 82 U/L (ref 38–126)
ALT: 16 U/L (ref 14–54)
ANION GAP: 5 (ref 5–15)
AST: 19 U/L (ref 15–41)
Albumin: 4.2 g/dL (ref 3.5–5.0)
BILIRUBIN TOTAL: 0.5 mg/dL (ref 0.3–1.2)
BUN: 15 mg/dL (ref 6–20)
CALCIUM: 9.2 mg/dL (ref 8.9–10.3)
CO2: 26 mmol/L (ref 22–32)
CREATININE: 0.8 mg/dL (ref 0.44–1.00)
Chloride: 104 mmol/L (ref 101–111)
Glucose, Bld: 110 mg/dL — ABNORMAL HIGH (ref 65–99)
Potassium: 4.6 mmol/L (ref 3.5–5.1)
Sodium: 135 mmol/L (ref 135–145)
TOTAL PROTEIN: 7.9 g/dL (ref 6.5–8.1)

## 2016-12-04 LAB — CBC
HEMATOCRIT: 37.1 % (ref 36.0–46.0)
Hemoglobin: 12.5 g/dL (ref 12.0–15.0)
MCH: 29.2 pg (ref 26.0–34.0)
MCHC: 33.7 g/dL (ref 30.0–36.0)
MCV: 86.7 fL (ref 78.0–100.0)
PLATELETS: 285 10*3/uL (ref 150–400)
RBC: 4.28 MIL/uL (ref 3.87–5.11)
RDW: 12.2 % (ref 11.5–15.5)
WBC: 7.6 10*3/uL (ref 4.0–10.5)

## 2016-12-04 LAB — TROPONIN I

## 2016-12-04 LAB — INFLUENZA PANEL BY PCR (TYPE A & B)
INFLAPCR: NEGATIVE
INFLBPCR: NEGATIVE

## 2016-12-04 NOTE — ED Notes (Signed)
ED Provider at bedside. 

## 2016-12-04 NOTE — Discharge Instructions (Signed)
Return to the ED with any concerns including difficulty breathing, fainting, leg swelling, fever/chills, vomiting and not able to keep down liquids, decreased level of alertness/lethargy, or any other alarming symptoms

## 2016-12-04 NOTE — ED Notes (Signed)
Was approached by K. Diego Cory about the patient who was extremely upset about not getting her copies of her labs or EKG that she needs for the methadone clinic she attends.  Spoke with the patient and she states she is angry because we did not tell her why she is having a rapid heart rate and wants to know why.  States she is afraid she will die if she goes home.  Attempted to discuss the negative findings with the patient and she continued to raise her voice and demand her test results.  Called to have security at the door,  Copies of the EKG and test results were given to the patient to diffuse the situation.  Dr. Canary Brim aware of the outburst, stated she had been in multiple times to discuss with the patient and stated we could give the results to the patient.  Encouraged patient to work with her PCP and Methadone Doctor to [employer] future tests in the future.  Patient was highly irrational, cussing and raising her voice in the room, the hall way and while waiting in the discharge   Security walked patient to the lobby.

## 2016-12-04 NOTE — ED Triage Notes (Signed)
Pt reports sob and chest pain x1wk; pt thinks symptoms are related to her dose of methadone. Pt able to speak in complete sentences. Pt anxious during triage.

## 2016-12-04 NOTE — ED Notes (Signed)
Patient transported to X-ray 

## 2016-12-04 NOTE — ED Provider Notes (Signed)
[facility] DEPT [facility] Provider Note   CSN: [id number] Arrival date & time: 12/04/16  1046     History   Chief Complaint Chief Complaint  Patient presents with  . Shortness of Breath  . Chest Pain    HPI [name] [name] is a 36 y.o. female.  HPI  Pt presenting with c/o episodes of palpitations, feeling like her heart is pounding and racing associated with a flushed feeling in her face, pounding in her ears, shortness of breath.  She states she has felt these symptoms for the past 8 days, feels worse after taking her methadone dose for the day.  She has been on the same dose of methadone for 2 years. Has placed a request to decrease the dose.  No fever/chills. No cough.  She is concerned she may have the flu.  She c/o chest pain when her heart is pounding.  No fainting. She is very anxious about her symptoms.  There are no other associated systemic symptoms, there are no other alleviating or modifying factors.   Past Medical History:  Diagnosis Date  . Acute bronchitis 10/13/2010   Qualifier: Diagnosis of  By: Charlett Blake MD, Erline Levine    . ADD (attention deficit disorder) 06/18/2014  . Anxiety   . Anxiety and depression 11/27/2011  . Arthritis of spine ([place]) 08/16/2010   Qualifier: Diagnosis of  By: Charlett Blake MD, Erline Levine    . Fracture of spinal vertebra 2009   Surgery in 2011  . Headache(784.0)   . Hemorrhoids thrombosed   . Insomnia 07/13/2013  . Mental disorder   . Nausea & vomiting 07/10/2012  . PHARYNGITIS 10/13/2010   Qualifier: Diagnosis of  By: Charlett Blake MD, Erline Levine    . Postpartum depression 11/27/2011  . Preterm labor   . Seizures ([place])    Last seizure 2005    Patient Active Problem List   Diagnosis Date Noted  . NSVD (normal spontaneous vaginal delivery) 06/21/2015  . Active labor 06/18/2015  . Substance abuse affecting pregnancy in third trimester, antepartum 04/23/2015  . Supervision of high-risk pregnancy with insufficient prenatal care in third trimester 04/14/2015  .  Tobacco smoking affecting pregnancy in second trimester, antepartum 04/14/2015  . 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 History of preterm delivery, currently pregnant in second trimester 04/14/2015  . Pregnancy related nausea, antepartum 04/14/2015  . ADD (attention deficit disorder) 06/18/2014  . Backache 04/24/2013  . GERD without esophagitis 04/02/2013  . Chronic pain disorder 04/02/2013  . CTS (carpal tunnel syndrome) 06/06/2012  . Anxiety and depression 11/27/2011  . TOBACCO ABUSE 10/13/2010  . PANIC DISORDER 08/16/2010  . Migraine 08/16/2010  . Arthritis of spine ([place]) 08/16/2010  . Closed fracture of cervical vertebra ([place]) 08/16/2010    Past Surgical History:  Procedure Laterality Date  . CERVICAL FUSION  05/26/2011   She thinks it was C2  . TUBAL LIGATION N/A 06/18/2015   Procedure: POST PARTUM TUBAL LIGATION;  Surgeon: Truett Mainland, DO;  Location: [facility];  Service: Gynecology;  Laterality: N/A;  . WISDOM TOOTH EXTRACTION      OB History    Gravida Para Term Preterm AB Living   5 5 4 1   5    SAB TAB Ectopic Multiple Live Births         0 5       Home Medications    Prior to Admission medications   Medication Sig Start Date End Date Taking? Authorizing Provider  acetaminophen (TYLENOL) 500 MG  tablet Take 500 mg by mouth every 6 (six) hours as needed.   Yes Historical Provider, MD  docusate sodium (COLACE) 100 MG capsule Take 100 mg by mouth every other day.   Yes Historical Provider, MD  doxylamine, Sleep, (UNISOM) 25 MG tablet Take 50 mg by mouth at bedtime.   Yes Historical Provider, MD  ibuprofen (ADVIL,MOTRIN) 600 MG tablet Take 1 tablet (600 mg total) by mouth every 6 (six) hours. 06/19/15  Yes Caren Macadam, MD  methadone (DOLOPHINE) 10 MG/ML solution Take 130 mg by mouth daily.    Yes Historical Provider, MD  Probiotic Product (PROBIOTIC PO) Take 1 tablet by mouth daily.   Yes Historical Provider, MD    ranitidine (ZANTAC) 150 MG capsule Take 150 mg by mouth 2 (two) times daily.   Yes Historical Provider, MD  pantoprazole (PROTONIX) 40 MG tablet Take 1 tablet (40 mg total) by mouth daily. Patient not taking: Reported on 11/07/2016 06/11/15   Tanna Savoy Stinson, DO  promethazine (PHENERGAN) 12.5 MG tablet Take 1 tablet (12.5 mg total) by mouth every 6 (six) hours as needed for nausea or vomiting. Patient not taking: Reported on 11/07/2016 06/11/15   Truett Mainland, DO    Family History Family History  Problem Relation Age of Onset  . Arthritis Mother   . Hypertension Mother   . Coronary artery disease Father     s/p MIs/ smoker  . Alcohol abuse Father   . Heart attack Father   . Endometriosis Sister   . Aplastic anemia Maternal Grandmother   . Arthritis Maternal Grandmother     s/p hip replacement for arthritis  . Anemia Maternal Grandmother     aplastic  . COPD Maternal Grandmother   . Heart disease Maternal Grandmother   . Scoliosis Maternal Grandfather     s/p surgeries  . Endometriosis Sister   . Cancer Sister     cervical  . Hypertension Sister   . Otitis media Daughter     Social History Social History  Substance Use Topics  . Smoking status: Current Every Day Smoker    Packs/day: 0.50    Years: 17.00    Types: Cigarettes  . Smokeless tobacco: Never Used  . Alcohol use No     Allergies   Cephalexin   Review of Systems Review of Systems  ROS reviewed and all otherwise negative except for mentioned in HPI   Physical Exam Updated Vital Signs BP 139/93 (BP Location: Right Arm)   Pulse 80   Temp 98.2 F (36.8 C) (Oral)   Resp 18   Ht 5\' 6"  (1.676 m)   Wt 170 lb (77.1 kg)   LMP 11/12/2016 (Exact Date)   SpO2 100%   Breastfeeding? No   BMI 27.44 kg/m  Vitals reviewed Physical Exam Physical Examination: General appearance - alert, well appearing, and in no distress Mental status - alert, oriented to person, place, and time Eyes - pupils equal and  reactive, no conjunctival injection, no scleral icterus Chest - clear to auscultation, no wheezes, rales or rhonchi, symmetric air entry Heart - normal rate, regular rhythm, normal S1, S2, no murmurs, rubs, clicks or gallops Neurological - alert, oriented, normal speech Extremities - peripheral pulses normal, no pedal edema, no clubbing or cyanosis Skin - normal coloration and turgor, no rashes, no suspicious skin lesions noted Psych- anxious and talkative, but cooperative  ED Treatments / Results  Labs (all labs ordered are listed, but only abnormal results are displayed) Labs  Reviewed  COMPREHENSIVE METABOLIC PANEL - Abnormal; Notable for the following:       Result Value   Glucose, Bld 110 (*)    All other components within normal limits  CBC  TROPONIN I  INFLUENZA PANEL BY PCR (TYPE A & B)  PREGNANCY, URINE    EKG  EKG Interpretation  Date/Time:  Sunday December 04 2016 10:53:46 EST Ventricular Rate:  78 PR Interval:    QRS Duration: 79 QT Interval:  391 QTC Calculation: 446 R Axis:   39 Text Interpretation:  Sinus rhythm Probable left atrial enlargement RSR' in V1 or V2, probably normal variant Borderline T abnormalities, diffuse leads No significant change since last tracing Confirmed by [facility]  MD, Debroah Shuttleworth [phone]) on 12/04/2016 11:00:31 AM       Radiology No results found.  Procedures Procedures (including critical care time)  Medications Ordered in ED Medications - No data to display   Initial Impression / Assessment and Plan / ED Course  I have reviewed the triage vital signs and the nursing notes.  Pertinent labs & imaging results that were available during my care of the patient were reviewed by me and considered in my medical decision making (see chart for details).     Pt presenting with concern for episodes of palpitations.  She feels flushed and heart racing.  She feels this occur more after taking her daily dose of methadone.  She has put in a  request for decreased dose through the physician.  Labs are reassuring, EKG sinus rhythm.  No palpitations in the ED.  Pt requested influenza testing as she has been exposed to a family member and is very concerned and anxious about the possibility of influenza.  This was negative.  D/w patient that the next step would be to f/u with cardiology for a likely holter monitor evaluation of her palpitations.  Discharged with strict return precautions.  Pt agreeable with plan.  Final Clinical Impressions(s) / ED Diagnoses   Final diagnoses:  Palpitations    New Prescriptions Discharge Medication List as of 12/04/2016  1:30 PM       Alfonzo Beers, MD 12/07/16 [phone]

## 2016-12-05 ENCOUNTER — Encounter (HOSPITAL_BASED_OUTPATIENT_CLINIC_OR_DEPARTMENT_OTHER): Payer: Self-pay | Admitting: Emergency Medicine

## 2016-12-05 ENCOUNTER — Emergency Department (HOSPITAL_BASED_OUTPATIENT_CLINIC_OR_DEPARTMENT_OTHER)
Admission: EM | Admit: 2016-12-05 | Discharge: 2016-12-05 | Discharge status: Against Medical Advice | Payer: Medicaid Other | Attending: Emergency Medicine | Admitting: Emergency Medicine

## 2016-12-05 ENCOUNTER — Emergency Department (HOSPITAL_BASED_OUTPATIENT_CLINIC_OR_DEPARTMENT_OTHER): Payer: Medicaid Other

## 2016-12-05 ENCOUNTER — Ambulatory Visit (INDEPENDENT_AMBULATORY_CARE_PROVIDER_SITE_OTHER): Payer: Medicaid Other | Admitting: Family Medicine

## 2016-12-05 DIAGNOSIS — F1110 Opioid abuse, uncomplicated: Secondary | ICD-10-CM | POA: Diagnosis not present

## 2016-12-05 DIAGNOSIS — R002 Palpitations: Secondary | ICD-10-CM | POA: Insufficient documentation

## 2016-12-05 DIAGNOSIS — Z79899 Other long term (current) drug therapy: Secondary | ICD-10-CM | POA: Insufficient documentation

## 2016-12-05 DIAGNOSIS — F17210 Nicotine dependence, cigarettes, uncomplicated: Secondary | ICD-10-CM | POA: Diagnosis not present

## 2016-12-05 DIAGNOSIS — R55 Syncope and collapse: Secondary | ICD-10-CM | POA: Insufficient documentation

## 2016-12-05 MED ORDER — LORAZEPAM 1 MG PO TABS
1.0000 mg | ORAL_TABLET | Freq: Once | ORAL | Status: DC
  Filled 2016-12-05: qty 1

## 2016-12-05 NOTE — ED Triage Notes (Signed)
Patient reports she was seen at methadone clinic this morning.  Reports she was then seen at PCP and sent to ER due to "almost passing out".  States they would not see her.  States that "i was seen here yesterday for the exact same thing and then they made me go home".  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  States "clinic saw EKG and they are going to wait a few days and start bringing me down".  States that last time they tried to wean her down that she began withdrawing.  States she has had hot flashes and "feels like my heart is exploding and my whole body covers with sweat".

## 2016-12-05 NOTE — ED Provider Notes (Signed)
[facility] DEPT [facility] Provider Note   CSN: [id number] Arrival date & time: 12/05/16  1023     History   Chief Complaint Chief Complaint  Patient presents with  . Palpitations    HPI [name] [name] is a 36 y.o. female.  36 yo F with a chief complaint of feeling like her heart is beating fast and this can pass out. This been going on for the past couple weeks. 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 She is concerned that this is going to kill her. She says about 20 times a day she feels like her heart is beating out of her chest and that she's going to pass out. She was seen here yesterday for the same. Said she went to her family doctor again today and they told her to come here. She denies any chest pain. Happened on exertion.   The history is provided by the patient.  Palpitations   This is a new problem. The current episode started more than 2 days ago. The problem occurs constantly. The problem has not changed since onset.Pertinent negatives include no fever, no chest pain, no nausea, no vomiting, no headaches, no dizziness and no shortness of breath. She has tried nothing for the symptoms. The treatment provided no relief. Her past medical history does not include heart disease.    Past Medical History:  Diagnosis Date  . Acute bronchitis 10/13/2010   Qualifier: Diagnosis of  By: Charlett Blake MD, Erline Levine    . ADD (attention deficit disorder) 06/18/2014  . Anxiety   . Anxiety and depression 11/27/2011  . Arthritis of spine ([place]) 08/16/2010   Qualifier: Diagnosis of  By: Charlett Blake MD, Erline Levine    . Fracture of spinal vertebra 2009   Surgery in 2011  . Headache(784.0)   . Hemorrhoids thrombosed   . Insomnia 07/13/2013  . Mental disorder   . Nausea & vomiting 07/10/2012  . PHARYNGITIS 10/13/2010   Qualifier: Diagnosis of  By: Charlett Blake MD, Erline Levine    . Postpartum depression 11/27/2011  . Preterm labor   . Seizures ([place])    Last seizure 2005     Patient Active Problem List   Diagnosis Date Noted  . NSVD (normal spontaneous vaginal delivery) 06/21/2015  . Active labor 06/18/2015  . Substance abuse affecting pregnancy in third trimester, antepartum 04/23/2015  . Supervision of high-risk pregnancy with insufficient prenatal care in third trimester 04/14/2015  . Tobacco smoking affecting pregnancy in second trimester, antepartum 04/14/2015  . 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 History of preterm delivery, currently pregnant in second trimester 04/14/2015  . Pregnancy related nausea, antepartum 04/14/2015  . ADD (attention deficit disorder) 06/18/2014  . Backache 04/24/2013  . GERD without esophagitis 04/02/2013  . Chronic pain disorder 04/02/2013  . CTS (carpal tunnel syndrome) 06/06/2012  . Anxiety and depression 11/27/2011  . TOBACCO ABUSE 10/13/2010  . PANIC DISORDER 08/16/2010  . Migraine 08/16/2010  . Arthritis of spine ([place]) 08/16/2010  . Closed fracture of cervical vertebra ([place]) 08/16/2010    Past Surgical History:  Procedure Laterality Date  . CERVICAL FUSION  05/26/2011   She thinks it was C2  . TUBAL LIGATION N/A 06/18/2015   Procedure: POST PARTUM TUBAL LIGATION;  Surgeon: Truett Mainland, DO;  Location: [facility];  Service: Gynecology;  Laterality: N/A;  . WISDOM TOOTH EXTRACTION      OB History    Gravida Para Term Preterm AB Living  5 5 4 1   5    SAB TAB Ectopic Multiple Live Births         0 5       Home Medications    Prior to Admission medications   Medication Sig Start Date End Date Taking? Authorizing Provider  acetaminophen (TYLENOL) 500 MG tablet Take 500 mg by mouth every 6 (six) hours as needed.    Historical Provider, MD  docusate sodium (COLACE) 100 MG capsule Take 100 mg by mouth every other day.    Historical Provider, MD  doxylamine, Sleep, (UNISOM) 25 MG tablet Take 50 mg by mouth at bedtime.    Historical Provider, MD  ibuprofen  (ADVIL,MOTRIN) 600 MG tablet Take 1 tablet (600 mg total) by mouth every 6 (six) hours. 06/19/15   Caren Macadam, MD  methadone (DOLOPHINE) 10 MG/ML solution Take 130 mg by mouth daily.     Historical Provider, MD  pantoprazole (PROTONIX) 40 MG tablet Take 1 tablet (40 mg total) by mouth daily. Patient not taking: Reported on 11/07/2016 06/11/15   Truett Mainland, DO  Probiotic Product (PROBIOTIC PO) Take 1 tablet by mouth daily.    Historical Provider, MD  promethazine (PHENERGAN) 12.5 MG tablet Take 1 tablet (12.5 mg total) by mouth every 6 (six) hours as needed for nausea or vomiting. Patient not taking: Reported on 11/07/2016 06/11/15   Tanna Savoy Stinson, DO  ranitidine (ZANTAC) 150 MG capsule Take 150 mg by mouth 2 (two) times daily.    Historical Provider, MD    Family History Family History  Problem Relation Age of Onset  . Arthritis Mother   . Hypertension Mother   . Coronary artery disease Father     s/p MIs/ smoker  . Alcohol abuse Father   . Heart attack Father   . Endometriosis Sister   . Aplastic anemia Maternal Grandmother   . Arthritis Maternal Grandmother     s/p hip replacement for arthritis  . Anemia Maternal Grandmother     aplastic  . COPD Maternal Grandmother   . Heart disease Maternal Grandmother   . Scoliosis Maternal Grandfather     s/p surgeries  . Endometriosis Sister   . Cancer Sister     cervical  . Hypertension Sister   . Otitis media Daughter     Social History Social History  Substance Use Topics  . Smoking status: Current Every Day Smoker    Packs/day: 0.50    Years: 17.00    Types: Cigarettes  . Smokeless tobacco: Never Used  . Alcohol use No     Allergies   Cephalexin   Review of Systems Review of Systems  Constitutional: Negative for chills and fever.  HENT: Negative for congestion and rhinorrhea.   Eyes: Negative for redness and visual disturbance.  Respiratory: Negative for shortness of breath and wheezing.     Cardiovascular: Positive for palpitations. Negative for chest pain.       A feeling like her heart is going to explode  Gastrointestinal: Negative for nausea and vomiting.  Genitourinary: Negative for dysuria and urgency.  Musculoskeletal: Negative for arthralgias and myalgias.  Skin: Negative for pallor and wound.  Neurological: Negative for dizziness and headaches.     Physical Exam Updated Vital Signs BP 130/89 (BP Location: Left Arm)   Pulse 95   Temp 98.6 F (37 C) (Oral)   Resp 20   Ht 5\' 7"  (1.702 m)   Wt 170 lb (77.1 kg)   LMP 11/12/2016 (  Exact Date)   SpO2 100%   BMI 26.63 kg/m   Physical Exam  Constitutional: She is oriented to person, place, and time. She appears well-developed and well-nourished. No distress.  HENT:  Head: Normocephalic and atraumatic.  Eyes: EOM are normal. Pupils are equal, round, and reactive to light.  Neck: Normal range of motion. Neck supple.  Cardiovascular: Normal rate and regular rhythm.  Exam reveals no gallop and no friction rub.   No murmur heard. Pulmonary/Chest: Effort normal. She has no wheezes. She has no rales.  Abdominal: Soft. She exhibits no distension and no mass. There is no tenderness. There is no guarding.  Musculoskeletal: She exhibits no edema or tenderness.  Neurological: She is alert and oriented to person, place, and time.  Skin: Skin is warm and dry. She is not diaphoretic.  Psychiatric: She has a normal mood and affect. Her behavior is normal.  Nursing note and vitals reviewed.    ED Treatments / Results  Labs (all labs ordered are listed, but only abnormal results are displayed) Labs Reviewed  D-DIMER, QUANTITATIVE (NOT AT [facility])    EKG  EKG Interpretation  Date/Time:  Monday December 05 2016 10:32:17 EST Ventricular Rate:  98 PR Interval:  184 QRS Duration: 80 QT Interval:  356 QTC Calculation: 454 R Axis:   69 Text Interpretation:  Normal sinus rhythm Nonspecific T wave abnormality Abnormal ECG  No significant change since last tracing Confirmed by Gerhard Rappaport MD, DANIEL [id number]) on 12/05/2016 11:10:14 AM       Radiology Dg Chest 2 View  Result Date: 12/05/2016 CLINICAL DATA:  Chest pain.  Cough and chest congestion. EXAM: CHEST  2 VIEW COMPARISON:  12/04/2016 FINDINGS: The heart size and mediastinal contours are within normal limits. Both lungs are clear. The visualized skeletal structures are unremarkable. IMPRESSION: Normal chest, unchanged. Electronically Signed   By: Lorriane Shire M.D.   On: 12/05/2016 11:01   Dg Chest 2 View  Result Date: 12/04/2016 CLINICAL DATA:  Chest pain, palpitations EXAM: CHEST  2 VIEW COMPARISON:  11/05/2009 FINDINGS: Heart and mediastinal contours are within normal limits. No focal opacities or effusions. No acute bony abnormality. IMPRESSION: No active cardiopulmonary disease. Electronically Signed   By: Rolm Baptise M.D.   On: 12/04/2016 12:02    Procedures Procedures (including critical care time)  Medications Ordered in ED Medications  LORazepam (ATIVAN) tablet 1 mg (not administered)     Initial Impression / Assessment and Plan / ED Course  I have reviewed the triage vital signs and the nursing notes.  Pertinent labs & imaging results that were available during my care of the patient were reviewed by me and considered in my medical decision making (see chart for details).     36 yo  FWith a chief complaints of exertional shortness of breath and palpitations. Discussed with the patient that I was going to evaluate her with a d-dimer. 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 I discussed with her that I did not feel that she needed to be emergently admitted and then she left prior to further testing.    Medications given during this visit Medications  LORazepam (ATIVAN) tablet 1 mg (not administered)     The patient appears reasonably screen  and/or stabilized for discharge and I doubt any other medical condition or other [facility] requiring further screening, evaluation, or treatment in the ED at this time  prior to discharge.    Final Clinical Impressions(s) / ED Diagnoses   Final diagnoses:  Heart palpitations  Near syncope    New Prescriptions Discharge Medication List as of 12/05/2016 12:07 PM       Deno Etienne, DO 12/05/16 1210

## 2016-12-05 NOTE — ED Notes (Signed)
Went in room to give patient medication and draw blood-patient gown on bed and cardiac monitor leads off.  Patient noted to not be in any bathroom.  Patient eloped.

## 2016-12-07 ENCOUNTER — Emergency Department (HOSPITAL_COMMUNITY)
Admission: EM | Admit: 2016-12-07 | Discharge: 2016-12-07 | Disposition: A | Discharge status: Home / Self Care | Payer: Medicaid Other | Attending: Emergency Medicine | Admitting: Emergency Medicine

## 2016-12-07 ENCOUNTER — Encounter (HOSPITAL_COMMUNITY): Payer: Self-pay

## 2016-12-07 DIAGNOSIS — R0789 Other chest pain: Secondary | ICD-10-CM | POA: Diagnosis not present

## 2016-12-07 DIAGNOSIS — F17210 Nicotine dependence, cigarettes, uncomplicated: Secondary | ICD-10-CM | POA: Diagnosis not present

## 2016-12-07 DIAGNOSIS — F419 Anxiety disorder, unspecified: Secondary | ICD-10-CM | POA: Diagnosis not present

## 2016-12-07 DIAGNOSIS — R079 Chest pain, unspecified: Secondary | ICD-10-CM | POA: Diagnosis present

## 2016-12-07 DIAGNOSIS — Z720 Tobacco use: Secondary | ICD-10-CM

## 2016-12-07 DIAGNOSIS — R002 Palpitations: Secondary | ICD-10-CM | POA: Diagnosis not present

## 2016-12-07 DIAGNOSIS — F909 Attention-deficit hyperactivity disorder, unspecified type: Secondary | ICD-10-CM | POA: Insufficient documentation

## 2016-12-07 LAB — I-STAT CHEM 8, ED
BUN: 16 mg/dL (ref 6–20)
CREATININE: 1 mg/dL (ref 0.44–1.00)
Calcium, Ion: 1.19 mmol/L (ref 1.15–1.40)
Chloride: 104 mmol/L (ref 101–111)
GLUCOSE: 100 mg/dL — AB (ref 65–99)
HEMATOCRIT: 38 % (ref 36.0–46.0)
HEMOGLOBIN: 12.9 g/dL (ref 12.0–15.0)
POTASSIUM: 4.2 mmol/L (ref 3.5–5.1)
Sodium: 138 mmol/L (ref 135–145)
TCO2: 24 mmol/L (ref 0–100)

## 2016-12-07 LAB — I-STAT TROPONIN, ED: TROPONIN I, POC: 0 ng/mL (ref 0.00–0.08)

## 2016-12-07 NOTE — Discharge Instructions (Signed)
Your chest pain is likely anxiety related, and your EKG, labs, and all prior ER evaluations have been reassuring; however you still may warrant further outpatient testing, such as Holter Monitoring. Try to relax in a dark quiet environment and take deep breaths when you feel like your symptoms are occuring. Stop smoking cigarettes. Stop consuming any caffeine. Stay well hydrated with plenty of water. Follow up with your primary care doctor in 1 week for recheck of symptoms and ongoing evaluation, such as potentially having a holter monitor test done. Return to the ER for emergent changes or worsening symptoms.

## 2016-12-07 NOTE — ED Triage Notes (Signed)
Pt. Here with chest pain and what she describes as palpitations for over a week that gets worse when she lays down. Hy of anxiety, GERD, and substance abuse. Currently taking methadone. States that she feels like she is going to pass out and she thinks it  could now be related to the methadone.

## 2016-12-07 NOTE — ED Triage Notes (Signed)
Pt. Here with chest pain and what she describes as palpitations for over a week that gets worse when she lays down. Hy. Of anxiety, GERD, and substance abuse. currently taking methadone. States that she feels like going to pass out and thinks it could now be related to the methadone

## 2016-12-07 NOTE — ED Provider Notes (Signed)
[facility] DEPT Provider Note   CSN: [id number] Arrival date & time: 12/07/16  1021     History   Chief Complaint Chief Complaint  Patient presents with  . Chest Pain    HPI [name] [name] is a 36 y.o. female with a PMHx of methadone use, anxiety, GERD, and chronic pain, who presents to the ED with complaints of recurrent episodes of CP, palpitations (sensation of heart beating hard and fast but not irregularly), lightheadedness, diaphoresis, and SOB that have been ongoing daily x6 days, always occurring 2hrs after taking Methadone, and usually lasting 30 minutes before resolving, but then recurring intermittently throughout the day usually between the hours of 9a-5p. Patient states that every time she takes her methadone, 2 hours after she takes it she develops a burning sensation in her chest, and feels that her heart is beating very hard and fast, gets lightheaded, short of breath, and diaphoretic. She describes the chest pain as 8/10 burning intermittent nonradiating pain across the center of her chest, worse with taking methadone, improved with lying down, and completely resolved after taking 324 mg aspirin today. She states that she usually notices these episodes lasting 30 minutes and then recurring throughout the day but only until 5 PM. No known aggravating factors aside from using methadone. She denies any changes with exertion or inspiration. These episodes typically occur when she's at rest and not during any specific exertional activity. Chart review reveals she was seen at [facility] on 12/04/16 for palpitations, had neg labs, neg troponin, neg flu test, unremarkable EKG, and neg CXR; she was then seen at [facility] ER 12/05/16 and again had neg labs including T4/TSH and two neg troponins, unchanged EKG, neg CXR; then went to [facility] on 12/05/16 and had unchanged EKG and neg CXR again, and was offered a d-dimer test but she eloped before testing could be completed. She was also seen at [facility] ER  11/07/16 for palpitations after snorting heroine and had an unremarkable EKG so she was discharged home. She states that she is concerned that her methadone is "going to kill her" and she is very anxious about her symptoms. She is scheduled to see a cardiologist but hasn't yet seen them. Currently she states that all of her symptoms resolved prior to arrival.  Denies ongoing lightheadedness, fevers, chills, cough, ongoing CP, ongoing SOB, wheezing, LE swelling, recent travel/surgery/immobilization, estrogen use, personal/family hx of DVT/PE, abd pain, N/V/D/C, hematuria, dysuria, myalgias, arthralgias, claudication, orthopnea, numbness, tingling, focal weakness, or any other complaints at this time. +Smoker, in the process of quitting, currently down to 1/2 cigarette a week. +FHx of cardiac disease in her father who's had 5 MIs before the age of 70, however he has survived all of them; no other family hx of cardiac disease. Denies IVDU. Admits to drinking sodas, but no longer drinks coffee.   The history is provided by the patient and medical records. No language interpreter was used.  Chest Pain   This is a recurrent problem. The current episode started more than 2 days ago. The problem occurs daily. The problem has not changed since onset.The pain is associated with rest. The pain is present in the substernal region. The pain is at a severity of 8/10. The pain is moderate. The quality of the pain is described as burning. The pain does not radiate. Duration of episode(s) is 30 minutes. Exacerbated by: taking methadone. Associated symptoms include diaphoresis, palpitations and shortness of breath. Pertinent negatives include no abdominal pain, no claudication,  no cough, no fever, no irregular heartbeat, no lower extremity edema, no nausea, no numbness, no orthopnea, no vomiting and no weakness. She has tried rest (and ASA) for the symptoms. The treatment provided significant relief. Risk factors include  smoking/tobacco exposure.  Her past medical history is significant for anxiety/panic attacks and stimulant use (caffeine).  Pertinent negatives for past medical history include no diabetes, no DVT, no hyperlipidemia, no hypertension and no PE.  Her family medical history is significant for CAD.  Pertinent negatives for family medical history include: no PE.    Past Medical History:  Diagnosis Date  . Acute bronchitis 10/13/2010   Qualifier: Diagnosis of  By: Charlett Blake MD, Erline Levine    . ADD (attention deficit disorder) 06/18/2014  . Anxiety   . Anxiety and depression 11/27/2011  . Arthritis of spine ([place]) 08/16/2010   Qualifier: Diagnosis of  By: Charlett Blake MD, Erline Levine    . Fracture of spinal vertebra 2009   Surgery in 2011  . Headache(784.0)   . Hemorrhoids thrombosed   . Insomnia 07/13/2013  . Mental disorder   . Nausea & vomiting 07/10/2012  . PHARYNGITIS 10/13/2010   Qualifier: Diagnosis of  By: Charlett Blake MD, Erline Levine    . Postpartum depression 11/27/2011  . Preterm labor   . Seizures ([place])    Last seizure 2005    Patient Active Problem List   Diagnosis Date Noted  . NSVD (normal spontaneous vaginal delivery) 06/21/2015  . Active labor 06/18/2015  . Substance abuse affecting pregnancy in third trimester, antepartum 04/23/2015  . Supervision of high-risk pregnancy with insufficient prenatal care in third trimester 04/14/2015  . Tobacco smoking affecting pregnancy in second trimester, antepartum 04/14/2015  . Methadone maintenance treatment affecting pregnancy, antepartum ([place]) 04/14/2015  . History of preterm delivery, currently pregnant in second trimester 04/14/2015  . Pregnancy related nausea, antepartum 04/14/2015  . ADD (attention deficit disorder) 06/18/2014  . Backache 04/24/2013  . GERD without esophagitis 04/02/2013  . Chronic pain disorder 04/02/2013  . CTS (carpal tunnel syndrome) 06/06/2012  . Anxiety and depression 11/27/2011  . TOBACCO ABUSE 10/13/2010  . PANIC DISORDER  08/16/2010  . Migraine 08/16/2010  . Arthritis of spine ([place]) 08/16/2010  . Closed fracture of cervical vertebra ([place]) 08/16/2010    Past Surgical History:  Procedure Laterality Date  . CERVICAL FUSION  05/26/2011   She thinks it was C2  . TUBAL LIGATION N/A 06/18/2015   Procedure: POST PARTUM TUBAL LIGATION;  Surgeon: Truett Mainland, DO;  Location: [facility];  Service: Gynecology;  Laterality: N/A;  . WISDOM TOOTH EXTRACTION      OB History    Gravida Para Term Preterm AB Living   5 5 4 1   5    SAB TAB Ectopic Multiple Live Births         0 5       Home Medications    Prior to Admission medications   Medication Sig Start Date End Date Taking? Authorizing Provider  acetaminophen (TYLENOL) 500 MG tablet Take 500 mg by mouth every 6 (six) hours as needed.    Historical Provider, MD  docusate sodium (COLACE) 100 MG capsule Take 100 mg by mouth every other day.    Historical Provider, MD  doxylamine, Sleep, (UNISOM) 25 MG tablet Take 50 mg by mouth at bedtime.    Historical Provider, MD  ibuprofen (ADVIL,MOTRIN) 600 MG tablet Take 1 tablet (600 mg total) by mouth every 6 (six) hours. 06/19/15   Caren Macadam, MD  methadone (DOLOPHINE) 10 MG/ML solution Take 130 mg by mouth daily.     Historical Provider, MD  pantoprazole (PROTONIX) 40 MG tablet Take 1 tablet (40 mg total) by mouth daily. Patient not taking: Reported on 11/07/2016 06/11/15   Truett Mainland, DO  Probiotic Product (PROBIOTIC PO) Take 1 tablet by mouth daily.    Historical Provider, MD  promethazine (PHENERGAN) 12.5 MG tablet Take 1 tablet (12.5 mg total) by mouth every 6 (six) hours as needed for nausea or vomiting. Patient not taking: Reported on 11/07/2016 06/11/15   Tanna Savoy Stinson, DO  ranitidine (ZANTAC) 150 MG capsule Take 150 mg by mouth 2 (two) times daily.    Historical Provider, MD    Family History Family History  Problem Relation Age of Onset  . Arthritis Mother   . Hypertension Mother   . Coronary  artery disease Father     s/p MIs/ smoker  . Alcohol abuse Father   . Heart attack Father   . Endometriosis Sister   . Aplastic anemia Maternal Grandmother   . Arthritis Maternal Grandmother     s/p hip replacement for arthritis  . Anemia Maternal Grandmother     aplastic  . COPD Maternal Grandmother   . Heart disease Maternal Grandmother   . Scoliosis Maternal Grandfather     s/p surgeries  . Endometriosis Sister   . Cancer Sister     cervical  . Hypertension Sister   . Otitis media Daughter     Social History Social History  Substance Use Topics  . Smoking status: Current Every Day Smoker    Packs/day: 0.50    Years: 17.00    Types: Cigarettes  . Smokeless tobacco: Never Used  . Alcohol use No     Allergies   Cephalexin   Review of Systems Review of Systems  Constitutional: Positive for diaphoresis. Negative for chills and fever.  Respiratory: Positive for shortness of breath. Negative for cough and wheezing.   Cardiovascular: Positive for chest pain and palpitations. Negative for orthopnea, claudication and leg swelling.  Gastrointestinal: Negative for abdominal pain, constipation, diarrhea, nausea and vomiting.  Genitourinary: Negative for dysuria and hematuria.  Musculoskeletal: Negative for arthralgias and myalgias.  Skin: Negative for color change.  Allergic/Immunologic: Negative for immunocompromised state.  Neurological: Positive for light-headedness. Negative for syncope, weakness and numbness.  Psychiatric/Behavioral: Negative for confusion.   10 Systems reviewed and are negative for acute change except as noted in the HPI.   Physical Exam Updated Vital Signs BP 122/74 (BP Location: Right Arm)   Pulse 73   Temp 98.8 F (37.1 C) (Oral)   Resp 12   Ht 5\' 7"  (1.702 m)   Wt 77.1 kg   LMP 11/12/2016 (Exact Date)   SpO2 99%   BMI 26.63 kg/m   Physical Exam  Constitutional: She is oriented to person, place, and time. Vital signs are normal. She  appears well-developed and well-nourished.  Non-toxic appearance. No distress.  Afebrile, nontoxic, NAD aside from being anxious  HENT:  Head: Normocephalic and atraumatic.  Mouth/Throat: Oropharynx is clear and moist and mucous membranes are normal.  Eyes: Conjunctivae and EOM are normal. Right eye exhibits no discharge. Left eye exhibits no discharge.  Neck: Normal range of motion. Neck supple.  Cardiovascular: Normal rate, regular rhythm, normal heart sounds and intact distal pulses.  Exam reveals no gallop and no friction rub.   No murmur heard. RRR, nl s1/s2, no m/r/g, distal pulses intact, no pedal edema  Pulmonary/Chest: Effort normal and breath sounds normal. No respiratory distress. She has no decreased breath sounds. She has no wheezes. She has no rhonchi. She has no rales. She exhibits no tenderness, no crepitus, no deformity and no retraction.  CTAB in all lung fields, no w/r/r, no hypoxia or increased WOB, speaking in full sentences, SpO2 100% on RA Chest wall nonTTP without crepitus, deformities, or retractions   Abdominal: Soft. Normal appearance and bowel sounds are normal. She exhibits no distension. There is no tenderness. There is no rigidity, no rebound, no guarding, no CVA tenderness, no tenderness at McBurney's point and negative Murphy's sign.  Musculoskeletal: Normal range of motion.  MAE x4 Strength and sensation grossly intact in all extremities Distal pulses intact Gait steady No pedal edema, neg homan's bilaterally   Neurological: She is alert and oriented to person, place, and time. She has normal strength. No sensory deficit.  Skin: Skin is warm, dry and intact. No rash noted.  Psychiatric: Her mood appears anxious.  Very anxious  Nursing note and vitals reviewed.    ED Treatments / Results  Labs (all labs ordered are listed, but only abnormal results are displayed) Labs Reviewed  I-STAT CHEM 8, ED - Abnormal; Notable for the following:       Result  Value   Glucose, Bld 100 (*)    All other components within normal limits  I-STAT TROPOININ, ED    EKG  EKG Interpretation  Date/Time:  Wednesday December 07 2016 10:22:05 EST Ventricular Rate:  75 PR Interval:    QRS Duration: 94 QT Interval:  419 QTC Calculation: 468 R Axis:   53 Text Interpretation:  Sinus rhythm Probable left atrial enlargement RSR' in V1 or V2, probably normal variant Borderline T wave abnormalities since last tracing no significant change Confirmed by Eulis Foster  MD, ELLIOTT [id number]) on 12/07/2016 10:26:30 AM       Radiology Dg Chest 2 View  Result Date: 12/05/2016 CLINICAL DATA:  Chest pain.  Cough and chest congestion. EXAM: CHEST  2 VIEW COMPARISON:  12/04/2016 FINDINGS: The heart size and mediastinal contours are within normal limits. Both lungs are clear. The visualized skeletal structures are unremarkable. IMPRESSION: Normal chest, unchanged. Electronically Signed   By: Lorriane Shire M.D.   On: 12/05/2016 11:01    Procedures Procedures (including critical care time)  Medications Ordered in ED Medications - No data to display   Initial Impression / Assessment and Plan / ED Course  I have reviewed the triage vital signs and the nursing notes.  Pertinent labs & imaging results that were available during my care of the patient were reviewed by me and considered in my medical decision making (see chart for details).     36 y.o. female here for the 4th ER visit in 4 days, with c/o CP, SOB, palpitations, lightheadedness, and diaphoresis every time she takes her methadone, lasting for ~5mins per episode and usually only happening during the hours of 9a-5p, ongoing for the last 6 days. She is convinced she has something wrong. She's had multiple neg work ups, including neg flu test, neg troponins x2 at one ER visit and neg troponins x1 and the other ER visits, neg CXRs, neg T4/TSH testing, and unremarkable EKGs at each visit. However she is still convinced she  has something wrong and that she's going to die. However, she states that today's episode went away entirely after she took 324mg  ASA and she arrives with no ongoing complaints. She appears very anxious,  however her exam is unremarkable; no tachycardia/hypoxia/LE swelling, PERC neg, doubt PE. Distal pulses intact. Advised that she likely just needs to see a cardiologist/PCP for holter monitoring to capture EKG during the events. EKG today is unchanged from prior and with no acute ischemic findings. I-stat troponin already sent and in process, so unable to be cancelled-- will allow this to result, and get Chem8, but doubt need for other emergent work up at this time; will cancel CXR and CBC/BMP. Will await for trop and Chem8 then reassess. Discussed case with my attending Dr. Eulis Foster who agrees with plan.   11:17 AM Trop negative. Chem8 unremarkable. Doubt need for further emergent evaluation at this time. Advised pt that this may be anxiety driven, discussed coping mechanisms for that. Advised pt to stay hydrated, stop smoking, stop using caffeine, and f/up with PCP or cardiology for holter monitor testing and ongoing evaluation and management. I explained the diagnosis and have given explicit precautions to return to the ER including for any other new or worsening symptoms. The patient understands and accepts the medical plan as it's been dictated and I have answered their questions. Discharge instructions concerning home care and prescriptions have been given. The patient is STABLE and is discharged to home in good condition.   Final Clinical Impressions(s) / ED Diagnoses   Final diagnoses:  Atypical chest pain  Anxiety  Palpitations  Tobacco user    New Prescriptions New Prescriptions   No medications on file     [address] PA-C 12/07/16 [place], MD 12/07/16 1659

## 2016-12-07 NOTE — ED Notes (Signed)
Papers reviewed with patient and she verbalizes understanding.  

## 2016-12-07 NOTE — ED Notes (Signed)
Taken to [id number]

## 2016-12-08 ENCOUNTER — Telehealth: Payer: Self-pay | Admitting: Family Medicine

## 2016-12-08 ENCOUNTER — Encounter: Payer: Self-pay | Admitting: Family Medicine

## 2016-12-08 ENCOUNTER — Emergency Department (HOSPITAL_COMMUNITY)
Admission: EM | Admit: 2016-12-08 | Discharge: 2016-12-08 | Disposition: A | Discharge status: Home / Self Care | Payer: Medicaid Other | Attending: Emergency Medicine | Admitting: Emergency Medicine

## 2016-12-08 ENCOUNTER — Encounter (HOSPITAL_COMMUNITY): Payer: Self-pay | Admitting: *Deleted

## 2016-12-08 DIAGNOSIS — F17210 Nicotine dependence, cigarettes, uncomplicated: Secondary | ICD-10-CM | POA: Insufficient documentation

## 2016-12-08 DIAGNOSIS — T403X5A Adverse effect of methadone, initial encounter: Secondary | ICD-10-CM | POA: Insufficient documentation

## 2016-12-08 DIAGNOSIS — F419 Anxiety disorder, unspecified: Secondary | ICD-10-CM | POA: Insufficient documentation

## 2016-12-08 DIAGNOSIS — F909 Attention-deficit hyperactivity disorder, unspecified type: Secondary | ICD-10-CM | POA: Insufficient documentation

## 2016-12-08 DIAGNOSIS — F4321 Adjustment disorder with depressed mood: Secondary | ICD-10-CM | POA: Diagnosis present

## 2016-12-08 MED ORDER — METHOCARBAMOL 500 MG PO TABS: 500.0000 mg | ORAL_TABLET | Freq: Two times a day (BID) | ORAL | 0 refills | Status: DC

## 2016-12-08 MED ORDER — DICYCLOMINE HCL 20 MG PO TABS: 20.0000 mg | ORAL_TABLET | Freq: Two times a day (BID) | ORAL | 0 refills | Status: DC

## 2016-12-08 MED ORDER — HYDROXYZINE HCL 25 MG PO TABS: 25.0000 mg | ORAL_TABLET | Freq: Four times a day (QID) | ORAL | 0 refills | Status: AC

## 2016-12-08 MED ORDER — ONDANSETRON HCL 4 MG PO TABS: 4.0000 mg | ORAL_TABLET | Freq: Four times a day (QID) | ORAL | 0 refills | Status: DC

## 2016-12-08 NOTE — ED Triage Notes (Signed)
Pt bib EMS and reports having an allergic reaction to methadone.  Pt takes methadone for the past two years.  Pt has anxiety issues and her primary care doctor is uncomfortable giving pt anti-anxiety medication until she can come off the methadone.  Pt reported palpitations to EMS and on EMS arrival pt had elevated VS.  But enroute, EMS stated pt appeared to calm down.    EMS VS: BP: 134/82, HR: 88, RR: 18, CBG: 313

## 2016-12-08 NOTE — ED Notes (Signed)
Bed: [id number] Expected date:  Expected time:  Means of arrival:  Comments: EMS anxiety issues

## 2016-12-08 NOTE — Telephone Encounter (Signed)
Patient dismissed from [facility] by Penni Homans MD, effective December 05, 2016. Dismissal letter sent out by certified / registered mail.  DAJ

## 2016-12-08 NOTE — ED Provider Notes (Signed)
[facility] DEPT Provider Note   CSN: [id number] Arrival date & time: 12/08/16  0716     History   Chief Complaint Chief Complaint  Patient presents with  . Medication Reaction  . Anxiety    HPI [name] [name] is a 36 y.o. female.  Pt presents to the Ed this morning due to anxiety and possible reaction to methadone.  The pt said she wants to quit methadone and no one will help her.  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  She started having trouble with it about a week ago.  She has been to multiple EDs for eval.  She has had full cardiac work ups for palpitations.  She is supposed to f/u with cardiology, but has not yet done this.  The pt said she had palpitations again this morning.  The pt feels fine now.      Past Medical History:  Diagnosis Date  . Acute bronchitis 10/13/2010   Qualifier: Diagnosis of  By: Charlett Blake MD, Erline Levine    . ADD (attention deficit disorder) 06/18/2014  . Anxiety   . Anxiety and depression 11/27/2011  . Arthritis of spine ([place]) 08/16/2010   Qualifier: Diagnosis of  By: Charlett Blake MD, Erline Levine    . Fracture of spinal vertebra 2009   Surgery in 2011  . Headache(784.0)   . Hemorrhoids thrombosed   . Insomnia 07/13/2013  . Mental disorder   . Nausea & vomiting 07/10/2012  . PHARYNGITIS 10/13/2010   Qualifier: Diagnosis of  By: Charlett Blake MD, Erline Levine    . Postpartum depression 11/27/2011  . Preterm labor   . Seizures ([place])    Last seizure 2005    Patient Active Problem List   Diagnosis Date Noted  . NSVD (normal spontaneous vaginal delivery) 06/21/2015  . Active labor 06/18/2015  . Substance abuse affecting pregnancy in third trimester, antepartum 04/23/2015  . Supervision of high-risk pregnancy with insufficient prenatal care in third trimester 04/14/2015  . Tobacco smoking affecting pregnancy in second trimester, antepartum 04/14/2015  . Methadone maintenance treatment affecting pregnancy, antepartum ([place]) 04/14/2015  . History of preterm  delivery, currently pregnant in second trimester 04/14/2015  . Pregnancy related nausea, antepartum 04/14/2015  . ADD (attention deficit disorder) 06/18/2014  . Backache 04/24/2013  . GERD without esophagitis 04/02/2013  . Chronic pain disorder 04/02/2013  . CTS (carpal tunnel syndrome) 06/06/2012  . Anxiety and depression 11/27/2011  . TOBACCO ABUSE 10/13/2010  . PANIC DISORDER 08/16/2010  . Migraine 08/16/2010  . Arthritis of spine ([place]) 08/16/2010  . Closed fracture of cervical vertebra ([place]) 08/16/2010    Past Surgical History:  Procedure Laterality Date  . CERVICAL FUSION  05/26/2011   She thinks it was C2  . TUBAL LIGATION N/A 06/18/2015   Procedure: POST PARTUM TUBAL LIGATION;  Surgeon: Truett Mainland, DO;  Location: [facility];  Service: Gynecology;  Laterality: N/A;  . WISDOM TOOTH EXTRACTION      OB History    Gravida Para Term Preterm AB Living   5 5 4 1   5    SAB TAB Ectopic Multiple Live Births         0 5       Home Medications    Prior to Admission medications   Medication Sig Start Date End Date Taking? Authorizing Provider  acetaminophen (TYLENOL) 500 MG tablet Take 500 mg by mouth every 6 (six) hours as needed.    Historical Provider, MD  dicyclomine (BENTYL) 20 MG tablet  Take 1 tablet (20 mg total) by mouth 2 (two) times daily. 12/08/16   Isla Pence, MD  docusate sodium (COLACE) 100 MG capsule Take 100 mg by mouth every other day.    Historical Provider, MD  doxylamine, Sleep, (UNISOM) 25 MG tablet Take 50 mg by mouth at bedtime.    Historical Provider, MD  hydrOXYzine (ATARAX/VISTARIL) 25 MG tablet Take 1 tablet (25 mg total) by mouth every 6 (six) hours. 12/08/16   Isla Pence, MD  ibuprofen (ADVIL,MOTRIN) 600 MG tablet Take 1 tablet (600 mg total) by mouth every 6 (six) hours. 06/19/15   Caren Macadam, MD  methadone (DOLOPHINE) 10 MG/ML solution Take 130 mg by mouth daily.     Historical Provider, MD  methocarbamol (ROBAXIN) 500 MG tablet Take 1  tablet (500 mg total) by mouth 2 (two) times daily. 12/08/16   Isla Pence, MD  ondansetron (ZOFRAN) 4 MG tablet Take 1 tablet (4 mg total) by mouth every 6 (six) hours. 12/08/16   Isla Pence, MD  pantoprazole (PROTONIX) 40 MG tablet Take 1 tablet (40 mg total) by mouth daily. Patient not taking: Reported on 11/07/2016 06/11/15   Truett Mainland, DO  Probiotic Product (PROBIOTIC PO) Take 1 tablet by mouth daily.    Historical Provider, MD  promethazine (PHENERGAN) 12.5 MG tablet Take 1 tablet (12.5 mg total) by mouth every 6 (six) hours as needed for nausea or vomiting. Patient not taking: Reported on 11/07/2016 06/11/15   Tanna Savoy Stinson, DO  ranitidine (ZANTAC) 150 MG capsule Take 150 mg by mouth 2 (two) times daily.    Historical Provider, MD    Family History Family History  Problem Relation Age of Onset  . Arthritis Mother   . Hypertension Mother   . Coronary artery disease Father     s/p MIs/ smoker  . Alcohol abuse Father   . Heart attack Father   . Endometriosis Sister   . Aplastic anemia Maternal Grandmother   . Arthritis Maternal Grandmother     s/p hip replacement for arthritis  . Anemia Maternal Grandmother     aplastic  . COPD Maternal Grandmother   . Heart disease Maternal Grandmother   . Scoliosis Maternal Grandfather     s/p surgeries  . Endometriosis Sister   . Cancer Sister     cervical  . Hypertension Sister   . Otitis media Daughter     Social History Social History  Substance Use Topics  . Smoking status: Current Every Day Smoker    Packs/day: 0.50    Years: 17.00    Types: Cigarettes  . Smokeless tobacco: Never Used  . Alcohol use No     Allergies   Cephalexin   Review of Systems Review of Systems  Cardiovascular: Positive for palpitations.  Psychiatric/Behavioral: The patient is nervous/anxious.   All other systems reviewed and are negative.    Physical Exam Updated Vital Signs BP 118/68 (BP Location: Left Arm)   Pulse 87   Temp  98.9 F (37.2 C) (Oral)   Resp 18   LMP 11/12/2016 (Exact Date)   SpO2 98%   Physical Exam  Constitutional: She is oriented to person, place, and time. She appears well-developed and well-nourished.  HENT:  Head: Normocephalic and atraumatic.  Right Ear: External ear normal.  Left Ear: External ear normal.  Nose: Nose normal.  Mouth/Throat: Oropharynx is clear and moist.  Eyes: Conjunctivae and EOM are normal. Pupils are equal, round, and reactive to light.  Neck:  Normal range of motion. Neck supple.  Cardiovascular: Normal rate, regular rhythm, normal heart sounds and intact distal pulses.   Pulmonary/Chest: Effort normal and breath sounds normal.  Abdominal: Soft. Bowel sounds are normal.  Musculoskeletal: Normal range of motion.  Neurological: She is alert and oriented to person, place, and time.  Skin: Skin is warm and dry.  Psychiatric: Her behavior is normal. Judgment and thought content normal. Her mood appears anxious.  Nursing note and vitals reviewed.    ED Treatments / Results  Labs (all labs ordered are listed, but only abnormal results are displayed) Labs Reviewed - No data to display  EKG  EKG Interpretation None       Radiology No results found.  Procedures Procedures (including critical care time)  Medications Ordered in ED Medications - No data to display   Initial Impression / Assessment and Plan / ED Course  I have reviewed the triage vital signs and the nursing notes.  Pertinent labs & imaging results that were available during my care of the patient were reviewed by me and considered in my medical decision making (see chart for details).    Pt has had multiple cardiac work ups.  Most recently, yesterday.  The pt wants to stop methadone.  She knows she can go into withdrawal, but wants nonaddictive meds to help her sx.  The pt will be given bentyl, atarax, robaxin, and zofran.  The pt is still encouraged to f/u with cards as these sx may not  be from methadone.  She knows to return if worse.  Final Clinical Impressions(s) / ED Diagnoses   Final diagnoses:  Anxiety  Adverse effect of methadone, initial encounter    New Prescriptions New Prescriptions   DICYCLOMINE (BENTYL) 20 MG TABLET    Take 1 tablet (20 mg total) by mouth 2 (two) times daily.   HYDROXYZINE (ATARAX/VISTARIL) 25 MG TABLET    Take 1 tablet (25 mg total) by mouth every 6 (six) hours.   METHOCARBAMOL (ROBAXIN) 500 MG TABLET    Take 1 tablet (500 mg total) by mouth 2 (two) times daily.   ONDANSETRON (ZOFRAN) 4 MG TABLET    Take 1 tablet (4 mg total) by mouth every 6 (six) hours.     Isla Pence, MD 12/08/16 [phone]

## 2016-12-10 ENCOUNTER — Encounter: Payer: Self-pay | Admitting: Family Medicine

## 2016-12-12 ENCOUNTER — Encounter: Payer: Self-pay | Admitting: Family Medicine

## 2016-12-13 ENCOUNTER — Ambulatory Visit (INDEPENDENT_AMBULATORY_CARE_PROVIDER_SITE_OTHER): Payer: Medicaid Other | Admitting: Family Medicine

## 2016-12-13 VITALS — BP 132/88 | HR 95

## 2016-12-13 DIAGNOSIS — R030 Elevated blood-pressure reading, without diagnosis of hypertension: Secondary | ICD-10-CM

## 2016-12-13 NOTE — Progress Notes (Signed)
Pre visit review using our clinic review tool, if applicable. No additional management support is needed unless otherwise documented below in the visit note.  Patient presents in office for blood pressure/pulse check per Patient Email Note 12/10/16. Currently, she does not take any medications for blood pressure. Patient denies chest pain, lightheadedness, headaches, dizziness and numbness/tingling of limbs. Today's readings are as follow: BP 132/88 P 95 02 100%.  Per Dr. Charlett Blake: Readings were acceptable. See Methadone provider for recommendations for other providers who will prescribe suboxone.  Patient was made aware of the provider's instructions and voiced understanding.   RN BP note reviewed. Agree with documention and plan.  Penni Homans, MD

## 2016-12-13 NOTE — Patient Instructions (Addendum)
Per Dr. Charlett Blake: Readings were acceptable. See Methadone provider for recommendations for other providers who will prescribe suboxone.

## 2016-12-14 ENCOUNTER — Ambulatory Visit: Payer: Medicaid Other | Admitting: Family Medicine

## 2016-12-16 NOTE — Telephone Encounter (Signed)
Received signed domestic return receipt verifying delivery of certified letter on December 14, 2016. Article number [id number] [place]

## 2017-12-25 ENCOUNTER — Encounter: Payer: Self-pay | Admitting: *Deleted

## 2018-03-24 ENCOUNTER — Other Ambulatory Visit: Payer: Self-pay

## 2018-03-24 ENCOUNTER — Encounter (HOSPITAL_COMMUNITY): Payer: Self-pay | Admitting: Emergency Medicine

## 2018-03-24 ENCOUNTER — Emergency Department (HOSPITAL_COMMUNITY)
Admission: EM | Admit: 2018-03-24 | Discharge: 2018-03-25 | Disposition: A | Discharge status: Home / Self Care | Payer: Medicaid Other | Attending: Emergency Medicine | Admitting: Emergency Medicine

## 2018-03-24 ENCOUNTER — Emergency Department (HOSPITAL_COMMUNITY): Payer: Medicaid Other

## 2018-03-24 DIAGNOSIS — Z79899 Other long term (current) drug therapy: Secondary | ICD-10-CM | POA: Insufficient documentation

## 2018-03-24 DIAGNOSIS — F1590 Other stimulant use, unspecified, uncomplicated: Secondary | ICD-10-CM | POA: Insufficient documentation

## 2018-03-24 DIAGNOSIS — F17210 Nicotine dependence, cigarettes, uncomplicated: Secondary | ICD-10-CM | POA: Insufficient documentation

## 2018-03-24 DIAGNOSIS — R609 Edema, unspecified: Secondary | ICD-10-CM | POA: Insufficient documentation

## 2018-03-24 DIAGNOSIS — R002 Palpitations: Secondary | ICD-10-CM | POA: Insufficient documentation

## 2018-03-24 DIAGNOSIS — Z789 Other specified health status: Secondary | ICD-10-CM

## 2018-03-24 LAB — I-STAT BETA HCG BLOOD, ED (MC, WL, AP ONLY): I-stat hCG, quantitative: <5 m[IU]/mL (ref <5)

## 2018-03-24 LAB — BASIC METABOLIC PANEL
ANION GAP: 10 (ref 5–15)
BUN: 12 mg/dL (ref 6–20)
CO2: 23 mmol/L (ref 22–32)
Calcium: 8.9 mg/dL (ref 8.9–10.3)
Chloride: 103 mmol/L (ref 101–111)
Creatinine, Ser: 0.8 mg/dL (ref 0.44–1.00)
GFR calc non Af Amer: >60 mL/min (ref >60)
GLUCOSE: 141 mg/dL — AB (ref 65–99)
POTASSIUM: 4.2 mmol/L (ref 3.5–5.1)
Sodium: 136 mmol/L (ref 135–145)

## 2018-03-24 LAB — I-STAT TROPONIN, ED: Troponin i, poc: 0 ng/mL (ref 0.00–0.08)

## 2018-03-24 LAB — CBC
HEMATOCRIT: 33.4 % — AB (ref 36.0–46.0)
Hemoglobin: 11.3 g/dL — ABNORMAL LOW (ref 12.0–15.0)
MCH: 30.5 pg (ref 26.0–34.0)
MCHC: 33.8 g/dL (ref 30.0–36.0)
MCV: 90 fL (ref 78.0–100.0)
Platelets: 257 10*3/uL (ref 150–400)
RBC: 3.71 MIL/uL — AB (ref 3.87–5.11)
RDW: 11.8 % (ref 11.5–15.5)
WBC: 6.8 10*3/uL (ref 4.0–10.5)

## 2018-03-24 LAB — BRAIN NATRIURETIC PEPTIDE: B Natriuretic Peptide: 31 pg/mL (ref 0.0–100.0)

## 2018-03-24 MED ORDER — IOPAMIDOL (ISOVUE-370) INJECTION 76%
100.0000 mL | Freq: Once | INTRAVENOUS | Status: AC | PRN
  Administered 2018-03-24: 100 mL via INTRAVENOUS

## 2018-03-24 MED ORDER — IOPAMIDOL (ISOVUE-370) INJECTION 76%
INTRAVENOUS | Status: AC
  Filled 2018-03-24: qty 100

## 2018-03-24 NOTE — ED Triage Notes (Signed)
Reports sitting on the couch today when she had a sudden onset of pressure to entire body.  Felt like she was being pressed into the couch and skin felt like it was on fire.  Reports she felt like she was "fading out" but did not pass out.  Also endorses having palpitations at night.  Reports all symptoms have currently resolved.

## 2018-03-24 NOTE — ED Provider Notes (Signed)
[facility] EMERGENCY DEPARTMENT Provider Note   CSN: [id number] Arrival date & time: 03/24/18  1958     History   Chief Complaint Chief Complaint  Patient presents with  . Palpitations    HPI [name] [name] is a 37 y.o. female with a hx of bronchitis ADD, anxiety, seizures, polysubstance abuse presents to the Emergency Department complaining of intermittent palpitations.  Patient reports for the last month she has had palpitations at night as she attempted to fall asleep.  She reports it happens every night during this event but not at the same time.  She reports she is currently taking methadone for the last 3 months and has previously had problems with severe anxiety while on methadone.  She states she is taking Vistaril to help with this.  Patient also reports chronic swelling of her legs.  She reports taking "a fluid pill" for this long-term without significant improvement.  She reports that she is not very active person due to chronic back pain.  She reports she is a smoker but denies oral contraceptive usage.  She denies history of DVT.  Patient does report associated dyspnea on exertion over the last week or so.  She denies chest pain, fever, chills, cough, nasal congestion, abdominal pain, nausea, vomiting, diarrhea, weakness, dizziness, syncope.  Reports she drinks coffee in the morning but no other caffeine during the day.  Reports she had similar symptoms several years ago when taking methadone including severe anxiety and swelling of her legs.  On further record review, no diuretic is noted on patient's medication list however Diurex, diet supplement with caffeine is indeed noted.  She reports she has been taking this daily for the last month about the time that her symptoms began..   The history is provided by the patient and medical records. No language interpreter was used.    Past Medical History:  Diagnosis Date  . Acute bronchitis 10/13/2010   Qualifier: Diagnosis of  By: Charlett Blake MD, Erline Levine    . ADD (attention deficit disorder) 06/18/2014  . Anxiety   . Anxiety and depression 11/27/2011  . Arthritis of spine 08/16/2010   Qualifier: Diagnosis of  By: Charlett Blake MD, Erline Levine    . Fracture of spinal vertebra 2009   Surgery in 2011  . Headache(784.0)   . Hemorrhoids thrombosed   . Insomnia 07/13/2013  . Mental disorder   . Nausea & vomiting 07/10/2012  . PHARYNGITIS 10/13/2010   Qualifier: Diagnosis of  By: Charlett Blake MD, Erline Levine    . Postpartum depression 11/27/2011  . Preterm labor   . Seizures ([place])    Last seizure 2005    Patient Active Problem List   Diagnosis Date Noted  . NSVD (normal spontaneous vaginal delivery) 06/21/2015  . Active labor 06/18/2015  . Substance abuse affecting pregnancy in third trimester, antepartum 04/23/2015  . Supervision of high-risk pregnancy with insufficient prenatal care in third trimester 04/14/2015  . Tobacco smoking affecting pregnancy in second trimester, antepartum 04/14/2015  . Methadone maintenance treatment affecting pregnancy, antepartum ([place]) 04/14/2015  . History of preterm delivery, currently pregnant in second trimester 04/14/2015  . Pregnancy related nausea, antepartum 04/14/2015  . ADD (attention deficit disorder) 06/18/2014  . Backache 04/24/2013  . GERD without esophagitis 04/02/2013  . Chronic pain disorder 04/02/2013  . CTS (carpal tunnel syndrome) 06/06/2012  . Anxiety and depression 11/27/2011  . TOBACCO ABUSE 10/13/2010  . PANIC DISORDER 08/16/2010  . Migraine 08/16/2010  . Arthritis of spine 08/16/2010  .  Closed fracture of cervical vertebra ([place]) 08/16/2010    Past Surgical History:  Procedure Laterality Date  . CERVICAL FUSION  05/26/2011   She thinks it was C2  . TUBAL LIGATION N/A 06/18/2015   Procedure: POST PARTUM TUBAL LIGATION;  Surgeon: Truett Mainland, DO;  Location: [facility];  Service: Gynecology;  Laterality: N/A;  . WISDOM TOOTH EXTRACTION       OB History     Gravida  5   Para  5   Term  4   Preterm  1   AB      Living  5     SAB      TAB      Ectopic      Multiple  0   Live Births  5            Home Medications    Prior to Admission medications   Medication Sig Start Date End Date Taking? Authorizing Provider  Caffeine-Magnesium Salicylate (DIUREX) 16-109.6 MG TABS Take 1 tablet by mouth daily.   Yes [provider]  hydrOXYzine (ATARAX/VISTARIL) 25 MG tablet Take 1 tablet (25 mg total) by mouth every 6 (six) hours. 12/08/16  Yes Isla Pence, MD  methadone (DOLOPHINE) 10 MG/ML solution Take 110 mg by mouth daily.    Yes [provider]  Probiotic Product (PROBIOTIC PO) Take 1 tablet by mouth daily.   Yes [provider]  pantoprazole (PROTONIX) 40 MG tablet Take 1 tablet (40 mg total) by mouth daily. Patient not taking: Reported on 11/07/2016 06/11/15   Truett Mainland, DO  venlafaxine (EFFEXOR XR) 37.5 MG 24 hr capsule Take 1 capsule (37.5 mg total) by mouth daily. 12/13/11 01/09/12  Mosie Lukes, MD    Family History Family History  Problem Relation Age of Onset  . Arthritis Mother   . Hypertension Mother   . Coronary artery disease Father        s/p MIs/ smoker  . Alcohol abuse Father   . Heart attack Father   . Endometriosis Sister   . Aplastic anemia Maternal Grandmother   . Arthritis Maternal Grandmother        s/p hip replacement for arthritis  . Anemia Maternal Grandmother        aplastic  . COPD Maternal Grandmother   . Heart disease Maternal Grandmother   . Scoliosis Maternal Grandfather        s/p surgeries  . Endometriosis Sister   . Cancer Sister        cervical  . Hypertension Sister   . Otitis media Daughter     Social History Social History   Tobacco Use  . Smoking status: Current Every Day Smoker    Packs/day: 0.50    Years: 17.00    Pack years: 8.50    Types: Cigarettes  . Smokeless tobacco: Never Used  Substance Use Topics  . Alcohol use: No     Alcohol/week: 0.0 oz  . Drug use: Yes    Frequency: 5.0 times per week    Types: Heroin    Comment: Now on methadone only     Allergies   Cephalexin   Review of Systems Review of Systems  Constitutional: Negative for appetite change, diaphoresis, fatigue, fever and unexpected weight change.  HENT: Negative for mouth sores.   Eyes: Negative for visual disturbance.  Respiratory: Positive for shortness of breath. Negative for cough, chest tightness and wheezing.   Cardiovascular: Positive for palpitations and leg swelling.  Negative for chest pain.  Gastrointestinal: Negative for abdominal pain, constipation, diarrhea, nausea and vomiting.  Endocrine: Negative for polydipsia, polyphagia and polyuria.  Genitourinary: Negative for dysuria, frequency, hematuria and urgency.  Musculoskeletal: Negative for back pain and neck stiffness.  Skin: Negative for rash.  Allergic/Immunologic: Negative for immunocompromised state.  Neurological: Negative for syncope, light-headedness and headaches.  Hematological: Does not bruise/bleed easily.  Psychiatric/Behavioral: Negative for sleep disturbance. The patient is not nervous/anxious.      Physical Exam Updated Vital Signs BP 137/74 (BP Location: Right Arm)   Pulse 91   Temp 97.8 F (36.6 C) (Oral)   Resp 18   Ht 5\' 5"  (1.651 m)   Wt 68 kg (150 lb)   SpO2 100%   BMI 24.96 kg/m   Physical Exam  Constitutional: She appears well-developed and well-nourished. No distress.  Awake, alert, nontoxic appearance  HENT:  Head: Normocephalic and atraumatic.  Mouth/Throat: Oropharynx is clear and moist. No oropharyngeal exudate.  Eyes: Conjunctivae are normal. No scleral icterus.  Neck: Normal range of motion. Neck supple.  Cardiovascular: Normal rate, regular rhythm and intact distal pulses.  Pulmonary/Chest: Effort normal and breath sounds normal. No respiratory distress. She has no wheezes.  Equal chest expansion  Abdominal: Soft. Bowel  sounds are normal. She exhibits no mass. There is no tenderness. There is no rebound and no guarding.  Musculoskeletal: Normal range of motion. She exhibits edema ( 2+ pitting edema of the bilateral lower extremities).  Neurological: She is alert.  Speech is clear and goal oriented Moves extremities without ataxia  Skin: Skin is warm and dry. She is not diaphoretic.  Psychiatric: She has a normal mood and affect.  Nursing note and vitals reviewed.    ED Treatments / Results  Labs (all labs ordered are listed, but only abnormal results are displayed) Labs Reviewed  BASIC METABOLIC PANEL - Abnormal; Notable for the following components:      Result Value   Glucose, Bld 141 (*)    All other components within normal limits  CBC - Abnormal; Notable for the following components:   RBC 3.71 (*)    Hemoglobin 11.3 (*)    HCT 33.4 (*)    All other components within normal limits  BRAIN NATRIURETIC PEPTIDE  I-STAT TROPONIN, ED  I-STAT BETA HCG BLOOD, ED ([facility], [facility], [facility] ONLY)    EKG EKG Interpretation  Date/Time:  Saturday March 24 2018 20:15:53 EDT Ventricular Rate:  83 PR Interval:  174 QRS Duration: 78 QT Interval:  400 QTC Calculation: 470 R Axis:   49 Text Interpretation:  Normal sinus rhythm Nonspecific T wave abnormality new  Prolonged QT Confirmed by Blanchie Dessert [phone]) on 03/24/2018 10:19:51 PM   Radiology Dg Chest 2 View  Result Date: 03/24/2018 CLINICAL DATA:  Chest pressure and pain EXAM: CHEST - 2 VIEW COMPARISON:  12/04/2016 FINDINGS: The heart size and mediastinal contours are within normal limits. Both lungs are clear. The visualized skeletal structures are unremarkable. IMPRESSION: No active cardiopulmonary disease. Electronically Signed   By: Inez Catalina M.D.   On: 03/24/2018 20:36   Ct Angio Chest Pe W And/or Wo Contrast  Result Date: 03/24/2018 CLINICAL DATA:  Shortness of breath. Suspect pulmonary embolism. History of bronchitis. EXAM: CT ANGIOGRAPHY CHEST  WITH CONTRAST TECHNIQUE: Multidetector CT imaging of the chest was performed using the standard protocol during bolus administration of intravenous contrast. Multiplanar CT image reconstructions and MIPs were obtained to evaluate the vascular anatomy. CONTRAST:  154mL ISOVUE-370 IOPAMIDOL (  ISOVUE-370) INJECTION 76% COMPARISON:  Chest radiograph March 24, 2018 and CT chest June 07, 2008. FINDINGS: CARDIOVASCULAR: Adequate contrast opacification of the pulmonary artery's. Main pulmonary artery is not enlarged. No pulmonary arterial filling defects to the level of the segmental branches. Admixture of contrast and distal pulmonary artery's. Heart size is normal, no right heart strain. No pericardial effusion. Thoracic aorta is normal course and caliber, unremarkable. MEDIASTINUM/NODES: No lymphadenopathy by CT size criteria. LUNGS/PLEURA: Tracheobronchial tree is patent, no pneumothorax. Mild bronchial wall thickening. No pleural effusions, focal consolidations, pulmonary nodules or masses. Mildly heterogeneous lung attenuation compatible with small airway disease. UPPER ABDOMEN: Included view of the abdomen is unremarkable. MUSCULOSKELETAL: Nonacute. Moderate RIGHT AC osteoarthrosis. Old mild T5 compression fracture. Review of the MIP images confirms the above findings. IMPRESSION: 1. No acute pulmonary embolism. 2. Bronchial wall thickening seen with bronchitis or reactive airway disease. No pneumonia. Electronically Signed   By: Elon Alas M.D.   On: 03/24/2018 23:33    Procedures Procedures (including critical care time)  Medications Ordered in ED Medications  iopamidol (ISOVUE-370) 76 % injection 100 mL (100 mLs Intravenous Contrast Given 03/24/18 2307)     Initial Impression / Assessment and Plan / ED Course  I have reviewed the triage vital signs and the nursing notes.  Pertinent labs & imaging results that were available during my care of the patient were reviewed by me and considered in my  medical decision making (see chart for details).  Clinical Course as of Mar 26 39  Sun Mar 25, 2018  0031 I reviewed patient's medication list and then discussed it with her.  She is indeed taking the Diurex every day.  She reports she started this medication approximately 1 month ago when the symptoms started.   [HM]  0039 Mild anemia noted.  Some below patient's last value however previous values were noted at approximately 10.  Hemoglobin(!): 11.3 [HM]    Clinical Course User Index [HM] Natasja Niday, Jarrett Soho, PA-C    Patient presents with complaints of chest palpitations, leg swelling and shortness of breath for the last month.  She reports similar symptoms several years ago when she was taking methadone.  She has been taking methadone for 3 months.  Patient is not totally low risk for pulmonary embolism due to her lack of mobility, bilateral leg swelling and smoking history.  She has no history of DVT.  Patient's legs are edematous however are symmetrically so.  Neither are erythematous or with increased warmth.  Less likely to be DVT.  CT of the chest is without evidence of pulmonary embolism.  CT scan does note some bronchial thickening.  Patient without symptoms of bronchitis.  I suspect this is likely due to her smoking.  I suspect patient's palpitations are secondary to both her methadone usage and her high caffeine usage with the diet pill.  Discussed with patient the importance of large volume caffeine cessation.  Patient will be referred to cardiology for further evaluation.  Additionally, patient will need primary care follow-up for peripheral edema.  She does have a prolonged QT today.  I cautioned against additional QT prolonging drugs.  Also discussed reasons to return immediately to the emergency department including any episode of syncope, chest pain or palpitations that do not resolve quickly.  Patient states understanding and is in agreement with the plan.    Final Clinical  Impressions(s) / ED Diagnoses   Final diagnoses:  Palpitations  Peripheral edema  Caffeine use    ED  Discharge Orders    None       Raza Bayless, Gwenlyn Perking 03/25/18 Bradly Chris, MD 03/25/18 2132

## 2018-03-24 NOTE — ED Notes (Signed)
Patient transported to CT 

## 2018-03-24 NOTE — ED Notes (Signed)
ED Provider at bedside. 

## 2018-03-25 NOTE — Discharge Instructions (Signed)
1. Medications: STOP taking Diurex, take usual home medications only as prescribed 2. Treatment: rest, drink plenty of fluids, elevate legs 3. Follow Up: Please followup with your primary doctor and cardiology in 2-3 days for discussion of your diagnoses and further evaluation after today's visit; if you do not have a primary care doctor use the resource guide provided to find one; Please return to the ER for persistent palpitations, passing out, worsening leg swelling, worsening shortness of breath or other concerns

## 2018-03-25 NOTE — ED Notes (Signed)
Pt verbalizes understanding of d/c instructions. Pt ambulatory at d/c with all belongings.   

## 2018-04-07 ENCOUNTER — Emergency Department (HOSPITAL_COMMUNITY): Payer: Medicaid Other

## 2018-04-07 ENCOUNTER — Emergency Department (HOSPITAL_COMMUNITY)
Admission: EM | Admit: 2018-04-07 | Discharge: 2018-04-07 | Disposition: A | Discharge status: Home / Self Care | Payer: Medicaid Other | Attending: Emergency Medicine | Admitting: Emergency Medicine

## 2018-04-07 ENCOUNTER — Encounter (HOSPITAL_COMMUNITY): Payer: Self-pay | Admitting: *Deleted

## 2018-04-07 DIAGNOSIS — M545 Low back pain, unspecified: Secondary | ICD-10-CM

## 2018-04-07 DIAGNOSIS — F17210 Nicotine dependence, cigarettes, uncomplicated: Secondary | ICD-10-CM | POA: Insufficient documentation

## 2018-04-07 DIAGNOSIS — Z79899 Other long term (current) drug therapy: Secondary | ICD-10-CM | POA: Diagnosis not present

## 2018-04-07 DIAGNOSIS — R32 Unspecified urinary incontinence: Secondary | ICD-10-CM | POA: Diagnosis not present

## 2018-04-07 LAB — URINALYSIS, ROUTINE W REFLEX MICROSCOPIC
BILIRUBIN URINE: NEGATIVE
Glucose, UA: NEGATIVE mg/dL
Hgb urine dipstick: NEGATIVE
KETONES UR: 5 mg/dL — AB
LEUKOCYTES UA: NEGATIVE
NITRITE: NEGATIVE
PROTEIN: NEGATIVE mg/dL
Specific Gravity, Urine: 1.024 (ref 1.005–1.030)
pH: 5 (ref 5.0–8.0)

## 2018-04-07 LAB — PREGNANCY, URINE: Preg Test, Ur: NEGATIVE

## 2018-04-07 NOTE — ED Triage Notes (Signed)
Pt complains of back pain for the past couple weeks and has been unable to control her bladder for the past few days.

## 2018-04-07 NOTE — Discharge Instructions (Addendum)
The x-ray does not reveal any sign of degenerative disc disease.  Laboratory testing was reassuring.  There is no sign of urinary tract infection.  To help your discomfort, try some gentle stretching, and use Tylenol as needed for pain.  It may be helpful to get into an exercise program, which your primary care doctor, or the orthopedic doctor could help you with.

## 2018-04-07 NOTE — ED Provider Notes (Signed)
[facility] DEPT Provider Note   CSN: [id number] Arrival date & time: 04/07/18  1630     History   Chief Complaint Chief Complaint  Patient presents with  . Back Pain  . Urinary Incontinence    HPI [name] [name] is a 37 y.o. female.  HPI   She presents for evaluation of back pain, and urinary incontinence.  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  Urinary incontinence has improved, now she has urinary frequency.  She denies dysuria, hematuria, constipation or diarrhea.  She also complains of a jerking sensation in her arms and legs since starting the methadone.  She is undergoing talk therapy, to help with her issues of drug abuse.  She denies fever, chills, cough, chest pain, weakness or paresthesia.  She is interested in getting "pictures of my back," to follow-up on prior degenerative changes.  There are no other known modifying factors.  Past Medical History:  Diagnosis Date  . Acute bronchitis 10/13/2010   Qualifier: Diagnosis of  By: Charlett Blake MD, Erline Levine    . ADD (attention deficit disorder) 06/18/2014  . Anxiety   . Anxiety and depression 11/27/2011  . Arthritis of spine 08/16/2010   Qualifier: Diagnosis of  By: Charlett Blake MD, Erline Levine    . Fracture of spinal vertebra 2009   Surgery in 2011  . Headache(784.0)   . Hemorrhoids thrombosed   . Insomnia 07/13/2013  . Mental disorder   . Nausea & vomiting 07/10/2012  . PHARYNGITIS 10/13/2010   Qualifier: Diagnosis of  By: Charlett Blake MD, Erline Levine    . Postpartum depression 11/27/2011  . Preterm labor   . Seizures ([place])    Last seizure 2005    Patient Active Problem List   Diagnosis Date Noted  . NSVD (normal spontaneous vaginal delivery) 06/21/2015  . Active labor 06/18/2015  . Substance abuse affecting pregnancy in third trimester, antepartum 04/23/2015  . Supervision of high-risk pregnancy with insufficient prenatal care in third  trimester 04/14/2015  . Tobacco smoking affecting pregnancy in second trimester, antepartum 04/14/2015  . Methadone maintenance treatment affecting pregnancy, antepartum ([place]) 04/14/2015  . History of preterm delivery, currently pregnant in second trimester 04/14/2015  . Pregnancy related nausea, antepartum 04/14/2015  . ADD (attention deficit disorder) 06/18/2014  . Backache 04/24/2013  . GERD without esophagitis 04/02/2013  . Chronic pain disorder 04/02/2013  . CTS (carpal tunnel syndrome) 06/06/2012  . Anxiety and depression 11/27/2011  . TOBACCO ABUSE 10/13/2010  . PANIC DISORDER 08/16/2010  . Migraine 08/16/2010  . Arthritis of spine 08/16/2010  . Closed fracture of cervical vertebra ([place]) 08/16/2010    Past Surgical History:  Procedure Laterality Date  . CERVICAL FUSION  05/26/2011   She thinks it was C2  . TUBAL LIGATION N/A 06/18/2015   Procedure: POST PARTUM TUBAL LIGATION;  Surgeon: Truett Mainland, DO;  Location: [facility];  Service: Gynecology;  Laterality: N/A;  . WISDOM TOOTH EXTRACTION       OB History    Gravida  5   Para  5   Term  4   Preterm  1   AB      Living  5     SAB      TAB      Ectopic      Multiple  0   Live Births  5            Home Medications  Prior to Admission medications   Medication Sig Start Date End Date Taking? Authorizing Provider  acetaminophen (TYLENOL) 500 MG tablet Take 1,000 mg by mouth every 6 (six) hours as needed for mild pain.   Yes [provider]  albuterol (PROVENTIL HFA;VENTOLIN HFA) 108 (90 Base) MCG/ACT inhaler Inhale 2 puffs into the lungs every 6 (six) hours as needed for wheezing or shortness of breath.   Yes [provider]  hydrOXYzine (ATARAX/VISTARIL) 25 MG tablet Take 1 tablet (25 mg total) by mouth every 6 (six) hours. 12/08/16  Yes Isla Pence, MD  ibuprofen (ADVIL,MOTRIN) 200 MG tablet Take 400 mg by mouth every 6 (six) hours as needed for moderate pain.   Yes [provider]  lidocaine (LIDODERM) 5 % Place 1 patch onto the skin daily. Remove & Discard patch within 12 hours or as directed by MD   Yes [provider]  methadone (DOLOPHINE) 10 MG/ML solution Take 120 mg by mouth daily.    Yes [provider]  Multiple Vitamin (MULTIVITAMIN WITH MINERALS) TABS tablet Take 2 tablets by mouth daily.   Yes [provider]  pregabalin (LYRICA) 100 MG capsule Take 100 mg by mouth as needed (nerve pain).   Yes [provider]  Probiotic Product (PROBIOTIC PO) Take 1 tablet by mouth daily.   Yes [provider]  pantoprazole (PROTONIX) 40 MG tablet Take 1 tablet (40 mg total) by mouth daily. Patient not taking: Reported on 11/07/2016 06/11/15   Truett Mainland, DO  venlafaxine (EFFEXOR XR) 37.5 MG 24 hr capsule Take 1 capsule (37.5 mg total) by mouth daily. 12/13/11 01/09/12  Mosie Lukes, MD    Family History Family History  Problem Relation Age of Onset  . Arthritis Mother   . Hypertension Mother   . Coronary artery disease Father        s/p MIs/ smoker  . Alcohol abuse Father   . Heart attack Father   . Endometriosis Sister   . Aplastic anemia Maternal Grandmother   . Arthritis Maternal Grandmother        s/p hip replacement for arthritis  . Anemia Maternal Grandmother        aplastic  . COPD Maternal Grandmother   . Heart disease Maternal Grandmother   . Scoliosis Maternal Grandfather        s/p surgeries  . Endometriosis Sister   . Cancer Sister        cervical  . Hypertension Sister   . Otitis media Daughter     Social History Social History   Tobacco Use  . Smoking status: Current Every Day Smoker    Packs/day: 0.50    Years: 17.00    Pack years: 8.50    Types: Cigarettes  . Smokeless tobacco: Never Used  Substance Use Topics  . Alcohol use: No    Alcohol/week: 0.0 oz  . Drug use: Yes    Frequency: 5.0 times per week    Types: Heroin    Comment: Now on methadone only      Allergies   Cephalexin   Review of Systems Review of Systems  All other systems reviewed and are negative.    Physical Exam Updated Vital Signs BP 109/72 (BP Location: Right Arm)   Pulse 81   Temp 98 F (36.7 C) (Oral)   Resp 17   Wt 87.9 kg (193 lb 12.8 oz)   SpO2 96%   BMI 32.25 kg/m   Physical Exam  Constitutional:  She is oriented to person, place, and time. She appears well-developed and well-nourished.  HENT:  Head: Normocephalic and atraumatic.  Eyes: Pupils are equal, round, and reactive to light. Conjunctivae and EOM are normal.  Neck: Normal range of motion and phonation normal. Neck supple.  Cardiovascular: Normal rate and regular rhythm.  Pulmonary/Chest: Effort normal and breath sounds normal. She exhibits no tenderness.  Abdominal: Soft. She exhibits no distension. There is no tenderness. There is no guarding.  Musculoskeletal: Normal range of motion.  Excellent range of motion arms, legs, upper and lower back.  Back is nontender to palpation.  There is no deformity of the back.  Neurological: She is alert and oriented to person, place, and time. She exhibits normal muscle tone.  Skin: Skin is warm and dry.  Psychiatric: She has a normal mood and affect. Her behavior is normal. Judgment and thought content normal.  Nursing note and vitals reviewed.    ED Treatments / Results  Labs (all labs ordered are listed, but only abnormal results are displayed) Labs Reviewed  URINALYSIS, ROUTINE W REFLEX MICROSCOPIC - Abnormal; Notable for the following components:      Result Value   Ketones, ur 5 (*)    All other components within normal limits  PREGNANCY, URINE    EKG None  Radiology Dg Lumbar Spine Complete  Result Date: 04/07/2018 CLINICAL DATA:  Lumbosacral back pain for weeks. EXAM: LUMBAR SPINE - COMPLETE 4+ VIEW COMPARISON:  Radiographs 07/08/2013 FINDINGS: The alignment is maintained. Vertebral body heights are normal. There is no  listhesis. The posterior elements are intact. Disc spaces are preserved. No fracture. Sacroiliac joints are symmetric and normal. Bilateral tubal ligation clips in the pelvis. IMPRESSION: Negative radiographs of the lumbar spine. Electronically Signed   By: Jeb Levering M.D.   On: 04/07/2018 21:54    Procedures Procedures (including critical care time)  Medications Ordered in ED Medications - No data to display   Initial Impression / Assessment and Plan / ED Course  I have reviewed the triage vital signs and the nursing notes.  Pertinent labs & imaging results that were available during my care of the patient were reviewed by me and considered in my medical decision making (see chart for details).  Clinical Course as of Apr 07 2229  Sat Apr 07, 2018  2220 Normal  Pregnancy, urine [EW]  2220 Normal \  Urinalysis, Routine w reflex microscopic- may I&O cath if menses(!) [EW]  2220 No acute disease, images reviewed  DG Lumbar Spine Complete [EW]    Clinical Course User Index [EW] Daleen Bo, MD     Patient Vitals for the past 24 hrs:  BP Temp Temp src Pulse Resp SpO2 Weight  04/07/18 2228 109/72 - - 81 17 96 % -  04/07/18 2044 111/72 98 F (36.7 C) Oral 84 16 98 % -  04/07/18 1658 - - - - - - 87.9 kg (193 lb 12.8 oz)  04/07/18 1654 (!) 149/73 98.3 F (36.8 C) Oral 99 18 98 % -    10:30 PM Reevaluation with update and discussion. After initial assessment and treatment, an updated evaluation reveals she is comfortable at this time and has no further complaints. Daleen Bo   Medical Decision Making: Nonspecific back pain, doubt lumbar myelopathy, radiculopathy, fracture, or metabolic disorder.  CRITICAL CARE-no Performed by: Daleen Bo   Nursing Notes Reviewed/ Care Coordinated Applicable Imaging Reviewed Interpretation of Laboratory Data incorporated into ED treatment  The patient appears reasonably screened  and/or stabilized for discharge and I doubt any  other medical condition or other [facility] requiring further screening, evaluation, or treatment in the ED at this time prior to discharge.  Plan: Home Medications-continue usual medication and use Tylenol for pain; Home Treatments-rest, heat, gentle stretching; return here if the recommended treatment, does not improve the symptoms; Recommended follow up-PCP or orthopedics for ongoing management of discomfort     Final Clinical Impressions(s) / ED Diagnoses   Final diagnoses:  Low back pain without sciatica, unspecified back pain laterality, unspecified chronicity    ED Discharge Orders    None       Daleen Bo, MD 04/07/18 2231

## 2022-01-28 ENCOUNTER — Emergency Department (HOSPITAL_BASED_OUTPATIENT_CLINIC_OR_DEPARTMENT_OTHER)
Admission: EM | Admit: 2022-01-28 | Discharge: 2022-01-28 | Disposition: A | Discharge status: Home / Self Care | Payer: Medicaid Other | Attending: Emergency Medicine | Admitting: Emergency Medicine

## 2022-01-28 ENCOUNTER — Other Ambulatory Visit: Payer: Self-pay

## 2022-01-28 ENCOUNTER — Encounter (HOSPITAL_BASED_OUTPATIENT_CLINIC_OR_DEPARTMENT_OTHER): Payer: Self-pay

## 2022-01-28 DIAGNOSIS — J029 Acute pharyngitis, unspecified: Secondary | ICD-10-CM | POA: Diagnosis present

## 2022-01-28 DIAGNOSIS — Z20822 Contact with and (suspected) exposure to covid-19: Secondary | ICD-10-CM | POA: Diagnosis not present

## 2022-01-28 LAB — RESP PANEL BY RT-PCR (FLU A&B, COVID) ARPGX2
Influenza A by PCR: NEGATIVE
Influenza B by PCR: NEGATIVE
SARS Coronavirus 2 by RT PCR: NEGATIVE

## 2022-01-28 LAB — GROUP A STREP BY PCR: Group A Strep by PCR: NOT DETECTED

## 2022-01-28 NOTE — ED Provider Notes (Signed)
?[facility] EMERGENCY DEPT ?Provider Note ? ? ?CSN: [id number] ?Arrival date & time: 01/28/22  1156 ? ?  ? ?History ? ?Chief Complaint  ?Patient presents with  ? Sore Throat  ? ? ?Lonita L Ortlieb is a 41 y.o. female. ? ?41 yo female with complaint of sore throat x 3 days, exposed to son who has strep. Also fever at home. Took leftover Flagyl and symptoms resolved until they returned today and now has worsening sore throat.  ? ? ?  ? ?Home Medications ?Prior to Admission medications   ?Medication Sig Start Date End Date Taking? Authorizing Provider  ?acetaminophen (TYLENOL) 500 MG tablet Take 1,000 mg by mouth every 6 (six) hours as needed for mild pain.    [provider]  ?albuterol (PROVENTIL HFA;VENTOLIN HFA) 108 (90 Base) MCG/ACT inhaler Inhale 2 puffs into the lungs every 6 (six) hours as needed for wheezing or shortness of breath.    [provider]  ?hydrOXYzine (ATARAX/VISTARIL) 25 MG tablet Take 1 tablet (25 mg total) by mouth every 6 (six) hours. 12/08/16   Isla Pence, MD  ?ibuprofen (ADVIL,MOTRIN) 200 MG tablet Take 400 mg by mouth every 6 (six) hours as needed for moderate pain.    [provider]  ?lidocaine (LIDODERM) 5 % Place 1 patch onto the skin daily. Remove & Discard patch within 12 hours or as directed by MD    [provider]  ?methadone (DOLOPHINE) 10 MG/ML solution Take 120 mg by mouth daily.     [provider]  ?Multiple Vitamin (MULTIVITAMIN WITH MINERALS) TABS tablet Take 2 tablets by mouth daily.    [provider]  ?pantoprazole (PROTONIX) 40 MG tablet Take 1 tablet (40 mg total) by mouth daily. ?Patient not taking: Reported on 11/07/2016 06/11/15   Truett Mainland, DO  ?pregabalin (LYRICA) 100 MG capsule Take 100 mg by mouth as needed (nerve pain).    [provider]  ?Probiotic Product (PROBIOTIC PO) Take 1 tablet by mouth daily.    [provider]  ?venlafaxine (EFFEXOR XR) 37.5 MG 24 hr capsule  Take 1 capsule (37.5 mg total) by mouth daily. 12/13/11 01/09/12  Mosie Lukes, MD  ?   ? ?Allergies    ?Cephalexin   ? ?Review of Systems   ?Review of Systems ?Negative except as per HPI ?Physical Exam ?Updated Vital Signs ?BP 122/80 (BP Location: Left Arm)   Pulse (!) 102   Temp 98.7 ?F (37.1 ?C)   Resp 18   Ht '5\' 5"'$  (1.651 m)   Wt 65.8 kg   LMP 01/20/2022   SpO2 100%   BMI 24.13 kg/m?  ?Physical Exam ?Vitals and nursing note reviewed.  ?Constitutional:   ?   General: She is not in acute distress. ?   Appearance: She is well-developed. She is not diaphoretic.  ?HENT:  ?   Head: Normocephalic and atraumatic.  ?   Right Ear: Tympanic membrane and ear canal normal.  ?   Left Ear: Tympanic membrane and ear canal normal.  ?   Nose: No congestion.  ?   Mouth/Throat:  ?   Mouth: Mucous membranes are moist.  ?   Pharynx: No pharyngeal swelling, oropharyngeal exudate or posterior oropharyngeal erythema.  ?   Tonsils: No tonsillar exudate or tonsillar abscesses. 0 on the right. 0 on the left.  ?Eyes:  ?   Conjunctiva/sclera: Conjunctivae normal.  ?Cardiovascular:  ?   Rate and Rhythm: Normal rate and regular rhythm.  ?  Heart sounds: Normal heart sounds.  ?Pulmonary:  ?   Effort: Pulmonary effort is normal.  ?   Breath sounds: Normal breath sounds.  ?Musculoskeletal:  ?   Cervical back: Neck supple.  ?Lymphadenopathy:  ?   Cervical: No cervical adenopathy.  ?Skin: ?   General: Skin is warm and dry.  ?Neurological:  ?   Mental Status: She is alert and oriented to person, place, and time.  ?Psychiatric:     ?   Behavior: Behavior normal.  ? ? ?ED Results / Procedures / Treatments   ?Labs ?(all labs ordered are listed, but only abnormal results are displayed) ?Labs Reviewed  ?GROUP A STREP BY PCR  ?CULTURE, GROUP A STREP [facility])  ?RESP PANEL BY RT-PCR (FLU A&B, COVID) [id number]  ? ? ?EKG ?None ? ?Radiology ?No results found. ? ?Procedures ?Procedures  ? ? ?Medications Ordered in ED ?Medications - No data to display ? ?ED  Course/ Medical Decision Making/ A&P ?  ?                        ?Medical Decision Making ? ?41 year old female with complaint of sore throat as above.  On exam, no notable exudate, no notable tonsillar swelling, no notable cervical of adenopathy.  Plan is to send throat culture as strep is negative today.  We will also add COVID and flu testing.  Recommend Motrin and Tylenol, warm salt water gargles and warm tea with honey.  If strep culture is positive, may need antibiotic sent in. ? ? ? ? ? ? ? ?Final Clinical Impression(s) / ED Diagnoses ?Final diagnoses:  ?Pharyngitis, unspecified etiology  ? ? ?Rx / DC Orders ?ED Discharge Orders   ? ? None  ? ?  ? ? ?  ?Tacy Learn, PA-C ?01/28/22 1514 ? ?  ?Malvin Johns, MD ?01/28/22 1533 ? ?

## 2022-01-28 NOTE — Discharge Instructions (Signed)
Follow-up in your MyChart account for your COVID/flu results as well as throat culture.  If your throat culture is positive for group a strep, an antibiotic may be called in.  ?Recheck with your doctor as needed. ?Motrin and Tylenol as needed as directed.  Warm salt water, drink warm tea with honey. ?

## 2022-01-28 NOTE — ED Triage Notes (Signed)
Patient complains of sore throat x 3 days ?
